# Patient Record
Sex: Female | Born: 1950 | Race: White | Hispanic: No | Marital: Married | Smoking: Former smoker
Health system: Southern US, Community
[De-identification: ages and names within clinical notes are randomized; demographics above are authoritative.]

## PROBLEM LIST (undated history)

## (undated) DIAGNOSIS — E119 Type 2 diabetes mellitus without complications: Secondary | ICD-10-CM

## (undated) DIAGNOSIS — G039 Meningitis, unspecified: Secondary | ICD-10-CM

## (undated) DIAGNOSIS — R6889 Other general symptoms and signs: Secondary | ICD-10-CM

## (undated) DIAGNOSIS — M539 Dorsopathy, unspecified: Secondary | ICD-10-CM

## (undated) DIAGNOSIS — I10 Essential (primary) hypertension: Secondary | ICD-10-CM

## (undated) DIAGNOSIS — J449 Chronic obstructive pulmonary disease, unspecified: Secondary | ICD-10-CM

## (undated) DIAGNOSIS — E7800 Pure hypercholesterolemia, unspecified: Secondary | ICD-10-CM

## (undated) DIAGNOSIS — M1990 Unspecified osteoarthritis, unspecified site: Secondary | ICD-10-CM

## (undated) HISTORY — DX: Dorsopathy, unspecified: M53.9

## (undated) HISTORY — DX: Other general symptoms and signs: R68.89

## (undated) HISTORY — DX: Essential (primary) hypertension: I10

## (undated) HISTORY — DX: Meningitis, unspecified: G03.9

## (undated) HISTORY — DX: Type 2 diabetes mellitus without complications: E11.9

## (undated) HISTORY — DX: Chronic obstructive pulmonary disease, unspecified: J44.9

## (undated) HISTORY — DX: Unspecified osteoarthritis, unspecified site: M19.90

## (undated) HISTORY — DX: Pure hypercholesterolemia, unspecified: E78.00

---
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
## 1976-11-25 HISTORY — PX: BACK SURGERY: SHX140

## 1983-11-26 HISTORY — PX: OVARIAN CYST REMOVAL: SHX89

## 1990-11-25 HISTORY — PX: CHOLECYSTECTOMY: SHX55

## 1991-11-26 HISTORY — PX: KNEE SURGERY: SHX244

## 2001-11-25 HISTORY — PX: WRIST SURGERY: SHX841

## 2005-02-04 ENCOUNTER — Inpatient Hospital Stay: Payer: Self-pay | Admitting: Internal Medicine

## 2005-04-03 ENCOUNTER — Ambulatory Visit: Payer: Self-pay

## 2005-04-04 ENCOUNTER — Ambulatory Visit: Payer: Self-pay

## 2005-04-30 ENCOUNTER — Emergency Department: Payer: Self-pay | Admitting: Emergency Medicine

## 2005-05-01 ENCOUNTER — Inpatient Hospital Stay: Payer: Self-pay | Admitting: Internal Medicine

## 2005-06-28 ENCOUNTER — Ambulatory Visit: Payer: Self-pay | Admitting: Internal Medicine

## 2005-08-25 ENCOUNTER — Ambulatory Visit: Payer: Self-pay | Admitting: Internal Medicine

## 2007-01-04 ENCOUNTER — Emergency Department (HOSPITAL_COMMUNITY): Admission: EM | Admit: 2007-01-04 | Discharge: 2007-01-04 | Payer: Self-pay | Admitting: Emergency Medicine

## 2007-10-19 ENCOUNTER — Emergency Department: Payer: Self-pay | Admitting: Emergency Medicine

## 2016-01-01 DIAGNOSIS — M545 Low back pain: Secondary | ICD-10-CM | POA: Diagnosis not present

## 2016-01-01 DIAGNOSIS — G894 Chronic pain syndrome: Secondary | ICD-10-CM | POA: Diagnosis not present

## 2016-01-01 DIAGNOSIS — Z79891 Long term (current) use of opiate analgesic: Secondary | ICD-10-CM | POA: Diagnosis not present

## 2016-01-01 DIAGNOSIS — M533 Sacrococcygeal disorders, not elsewhere classified: Secondary | ICD-10-CM | POA: Diagnosis not present

## 2016-01-01 DIAGNOSIS — Z5181 Encounter for therapeutic drug level monitoring: Secondary | ICD-10-CM | POA: Diagnosis not present

## 2016-01-03 DIAGNOSIS — I10 Essential (primary) hypertension: Secondary | ICD-10-CM | POA: Diagnosis not present

## 2016-01-03 DIAGNOSIS — J449 Chronic obstructive pulmonary disease, unspecified: Secondary | ICD-10-CM | POA: Diagnosis not present

## 2016-01-03 DIAGNOSIS — K3184 Gastroparesis: Secondary | ICD-10-CM | POA: Diagnosis not present

## 2016-01-03 DIAGNOSIS — R251 Tremor, unspecified: Secondary | ICD-10-CM | POA: Diagnosis not present

## 2016-01-03 DIAGNOSIS — E785 Hyperlipidemia, unspecified: Secondary | ICD-10-CM | POA: Diagnosis not present

## 2016-01-03 DIAGNOSIS — E119 Type 2 diabetes mellitus without complications: Secondary | ICD-10-CM | POA: Diagnosis not present

## 2016-01-16 ENCOUNTER — Ambulatory Visit (INDEPENDENT_AMBULATORY_CARE_PROVIDER_SITE_OTHER): Payer: PPO | Admitting: Neurology

## 2016-01-16 ENCOUNTER — Encounter: Payer: Self-pay | Admitting: Neurology

## 2016-01-16 VITALS — BP 126/67 | HR 78 | Ht 63.5 in | Wt 130.8 lb

## 2016-01-16 DIAGNOSIS — R251 Tremor, unspecified: Secondary | ICD-10-CM | POA: Diagnosis not present

## 2016-01-16 DIAGNOSIS — E785 Hyperlipidemia, unspecified: Secondary | ICD-10-CM

## 2016-01-16 DIAGNOSIS — I10 Essential (primary) hypertension: Secondary | ICD-10-CM | POA: Diagnosis not present

## 2016-01-16 NOTE — Progress Notes (Signed)
[facility]    Provider:  Dr Lucia Gaskins Referring Provider: Jackie Plum, MD Primary Care Physician:  No primary care provider on file.  CC:  Shakes  HPI:  Nicole Pittman is a 65 y.o. female here as a referral from Dr. Julio Sicks for shaking. PMHx HTN, tremor, HLD, diabetes, COPD. Worse when she writes, when she has a glass in her hand. Also in the head. Husband noticed it 3-4 months ago. Mother has shaking with parkinsons disease. Patient does not shake at rest. No new medications. No medication changes. Resting her head makes it better. No new neck pain or cervical muscle pain or range of motion in the neck changes. No inciting events or trauma. No new stress at home. She has a 66 year old with Autism but nothing has changed. Doesn't drink caffeine. The tremor only bothers her when she is writing. She takes Keppra and Reglan daily and she has been on those for at least 10 years. Nothing makes the tremor worse. No inciting event or head injury.   Reviewed notes, labs and imaging from outside physicians, which showed: reviewed records from PCP who noted b/l tremors in the hands worse with action. No imaging in EPIC, no labs in EPIC or in the documentation from pcp   Review of Systems: Patient complains of symptoms per HPI as well as the following symptoms: cough, joint pain, birth mark, tremor, insomnia. Pertinent negatives per HPI. All others negative.   Social History   Social History  . Marital Status: Married    Spouse Name: Dannielle Huh  . Number of Children: 4  . Years of Education: 12   Occupational History  . Retired    Social History Main Topics  . Smoking status: Current Every Day Smoker -- 0.50 packs/day    Types: Cigarettes  . Smokeless tobacco: Not on file  . Alcohol Use: No  . Drug Use: No  . Sexual Activity: Not on file   Other Topics Concern  . Not on file   Social History Narrative   Lives w/ husband and sons   Caffeine use: none     Family History  Problem Relation Age of Onset  . Rheum arthritis Mother   . Hypertension Mother   . Parkinson's disease Mother   . Parkinsonism Mother   . Breast cancer Maternal Aunt   . Lupus Maternal Aunt   . Diabetes Father     Past Medical History  Diagnosis Date  . Hypertension   . Diabetes (HCC)   . High cholesterol   . Arthritis   . Back problem     spurs  . COPD (chronic obstructive pulmonary disease) (HCC)   . Adhesive arachnoiditis   . Neck problem     bad disc    Past Surgical History  Procedure Laterality Date  . Tonsillectomy  1959  . Wrist surgery Bilateral 2003  . Knee surgery Bilateral 1993  . Cholecystectomy  1992  . Ovarian cyst removal  1985  . Back surgery  1978    L5    Current Outpatient Prescriptions  Medication Sig Dispense Refill  . Alcohol Swabs (ALCOHOL PREP) 70 % PADS 4 (four) times daily.  99  . amitriptyline (ELAVIL) 25 MG tablet Take 25 mg by mouth at bedtime.    Marland Kitchen aspirin 81 MG tablet Take 81 mg by mouth daily.    Marland Kitchen atorvastatin (LIPITOR) 40 MG tablet Take 40 mg by mouth daily.    . baclofen (LIORESAL) 10 MG  tablet Take 10 mg by mouth 3 (three) times daily.    Marland Kitchen EASY COMFORT LANCETS MISC 4 (four) times daily. for testing  99  . fenofibrate (TRICOR) 48 MG tablet Take 48 mg by mouth daily.    Marland Kitchen FREESTYLE LITE test strip 4 (four) times daily. for testing  99  . glipiZIDE-metformin (METAGLIP) 5-500 MG tablet Take 2 tablets by mouth 2 (two) times daily.  0  . levETIRAcetam (KEPPRA) 500 MG tablet Take 500 mg by mouth 3 (three) times daily.    . metoCLOPramide (REGLAN) 10 MG tablet Take 10 mg by mouth 4 (four) times daily.    . metoprolol (LOPRESSOR) 50 MG tablet Take 25 mg by mouth daily.     Marland Kitchen oxyCODONE (OXY IR/ROXICODONE) 5 MG immediate release tablet Take 5 mg by mouth every 6 (six) hours as needed.    . OXYCONTIN 20 MG 12 hr tablet Take 20 mg by mouth at bedtime.    . pioglitazone (ACTOS) 30 MG tablet Take 30 mg by mouth daily.     Marland Kitchen PROAIR HFA 108 (90 Base) MCG/ACT inhaler Take 2 puffs by mouth 4 (four) times daily. For 30 days  5  . quinapril (ACCUPRIL) 20 MG tablet Take 20 mg by mouth daily.  1  . SYMBICORT 80-4.5 MCG/ACT inhaler Take 2 puffs by mouth 2 (two) times daily.  2  . triamterene-hydrochlorothiazide (MAXZIDE-25) 37.5-25 MG tablet Take 1 tablet by mouth daily.  1  . Vitamin D, Ergocalciferol, (DRISDOL) 50000 units CAPS capsule Take 50,000 Units by mouth 2 (two) times a week.    . zolpidem (AMBIEN) 10 MG tablet Take 10 mg by mouth daily.     No current facility-administered medications for this visit.    Allergies as of 01/16/2016 - Review Complete 01/16/2016  Allergen Reaction Noted  . Codeine Hives 01/16/2016  . Morphine and related Hives 01/16/2016    Vitals: BP 126/67 mmHg  Pulse 78  Ht 5' 3.5" (1.613 m)  Wt 130 lb 12.8 oz (59.33 kg)  BMI 22.80 kg/m2 Last Weight:  Wt Readings from Last 1 Encounters:  01/16/16 130 lb 12.8 oz (59.33 kg)   Last Height:   Ht Readings from Last 1 Encounters:  01/16/16 5' 3.5" (1.613 m)   Physical exam: Exam: Gen: NAD, conversant, well nourised, obese, well groomed                     CV: RRR, no MRG. No Carotid Bruits. No peripheral edema, warm, nontender Eyes: Conjunctivae clear without exudates or hemorrhage  Neuro: Detailed Neurologic Exam  Speech:    Speech is normal; fluent and spontaneous with normal comprehension.  Cognition:    The patient is oriented to person, place, and time;     recent and remote memory intact;     language fluent;     normal attention, concentration,     fund of knowledge Cranial Nerves:    The pupils are equal, round, and reactive to light. Could not visualize fundi, attempted. Visual fields are full to finger confrontation. Extraocular movements are intact. Trigeminal sensation is intact and the muscles of mastication are normal. The face is symmetric. The palate elevates in the midline. Hearing intact. Voice is normal.  Shoulder shrug is normal. The tongue has normal motion without fasciculations.   Coordination:    Normal finger to nose and heel to shin. Normal rapid alternating movements.   Gait:    Heel-toe and tandem gait are normal.  Motor Observation:    Mild high frequency low amplitude head tremor Tone:    Normal muscle tone.    Posture:    Posture is normal. normal erect    Strength: Milt bilateral hip flexion weakness.     Strength is V/V in the upper and lower limbs.      Sensation: intact to LT     Reflex Exam:  DTR's:    Deep tendon reflexes in the upper and lower extremities are normal bilaterally.   Toes:    The toes are downgoing bilaterally.   Clonus:    Clonus is absent.       Assessment/Plan:  65 year old female with high frequency hand and mouth tremor worse with action. May be the start of essential tremor. Reviewed with patient, gave her hand out from [employer] to read about tremors. Explained this is benign and does not turn into parkinson's or any other serious condition but can affect actions significantly. Will order labs such as TSH. If negative can try Primidone. F/u 3 months.  Naomie Dean, MD  [facility] [address] [place], [place] [address]  Phone [phone] Fax [phone]

## 2016-01-16 NOTE — Patient Instructions (Signed)
Remember to drink plenty of fluid, eat healthy meals and do not skip any meals. Try to eat protein with a every meal and eat a healthy snack such as fruit or nuts in between meals. Try to keep a regular sleep-wake schedule and try to exercise daily, particularly in the form of walking, 20-30 minutes a day, if you can.   As far as your medications are concerned, I would like to suggest: If labs normal, consider starting Primidone  As far as diagnostic testing: labs  I would like to see you back in 3 months, sooner if we need to. Please call us with any interim questions, concerns, problems, updates or refill requests.   Our phone number is [phone]. We also have an after hours call service for urgent matters and there is a physician on-call for urgent questions. For any emergencies you know to call 911 or go to the nearest emergency room

## 2016-01-18 LAB — CBC WITH DIFFERENTIAL/PLATELET
BASOS ABS: 0 10*3/uL (ref 0.0–0.2)
BASOS: 0 %
EOS (ABSOLUTE): 0.4 10*3/uL (ref 0.0–0.4)
Eos: 3 %
HEMOGLOBIN: 12.5 g/dL (ref 11.1–15.9)
Hematocrit: 38.2 % (ref 34.0–46.6)
Immature Grans (Abs): 0.1 10*3/uL (ref 0.0–0.1)
Immature Granulocytes: 1 %
LYMPHS ABS: 2.7 10*3/uL (ref 0.7–3.1)
Lymphs: 22 %
MCH: 29.6 pg (ref 26.6–33.0)
MCHC: 32.7 g/dL (ref 31.5–35.7)
MCV: 90 fL (ref 79–97)
Monocytes Absolute: 1 10*3/uL — ABNORMAL HIGH (ref 0.1–0.9)
Monocytes: 8 %
NEUTROS ABS: 8.4 10*3/uL — AB (ref 1.4–7.0)
Neutrophils: 66 %
PLATELETS: 331 10*3/uL (ref 150–379)
RBC: 4.23 x10E6/uL (ref 3.77–5.28)
RDW: 13.6 % (ref 12.3–15.4)
WBC: 12.5 10*3/uL — ABNORMAL HIGH (ref 3.4–10.8)

## 2016-01-18 LAB — THYROID PANEL WITH TSH
Free Thyroxine Index: 2.2 (ref 1.2–4.9)
T3 UPTAKE RATIO: 28 % (ref 24–39)
T4 TOTAL: 8 ug/dL (ref 4.5–12.0)
TSH: 2 u[IU]/mL (ref 0.450–4.500)

## 2016-01-18 LAB — COMPREHENSIVE METABOLIC PANEL
ALT: 19 IU/L (ref 0–32)
AST: 18 IU/L (ref 0–40)
Albumin/Globulin Ratio: 2 (ref 1.1–2.5)
Albumin: 4.9 g/dL — ABNORMAL HIGH (ref 3.6–4.8)
Alkaline Phosphatase: 122 IU/L — ABNORMAL HIGH (ref 39–117)
BUN/Creatinine Ratio: 25 (ref 11–26)
BUN: 21 mg/dL (ref 8–27)
Bilirubin Total: 0.2 mg/dL (ref 0.0–1.2)
CO2: 19 mmol/L (ref 18–29)
Calcium: 9.8 mg/dL (ref 8.7–10.3)
Chloride: 96 mmol/L (ref 96–106)
Creatinine, Ser: 0.84 mg/dL (ref 0.57–1.00)
GFR calc Af Amer: 84 mL/min/{1.73_m2} (ref >59)
GFR calc non Af Amer: 73 mL/min/{1.73_m2} (ref >59)
Globulin, Total: 2.5 g/dL (ref 1.5–4.5)
Glucose: 160 mg/dL — ABNORMAL HIGH (ref 65–99)
Potassium: 4.3 mmol/L (ref 3.5–5.2)
Sodium: 136 mmol/L (ref 134–144)
Total Protein: 7.4 g/dL (ref 6.0–8.5)

## 2016-01-18 LAB — HEAVY METALS, BLOOD
ARSENIC: 6 ug/L (ref 2–23)
Lead, Blood: NOT DETECTED ug/dL (ref 0–19)
MERCURY: NOT DETECTED ug/L (ref 0.0–14.9)

## 2016-01-19 ENCOUNTER — Telehealth: Payer: Self-pay | Admitting: *Deleted

## 2016-01-19 NOTE — Telephone Encounter (Signed)
-----   Message from Anson Fret, MD sent at 01/18/2016  7:28 PM EST ----- White blood cells a little elevated. Nothing concerning if she feels well. She should just probably have this repeated next time she is at her primary care within the next 3-6 months.

## 2016-01-19 NOTE — Telephone Encounter (Signed)
Called pt and relayed results per Dr Lucia Gaskins note. Pt verbalized understanding.

## 2016-01-21 ENCOUNTER — Telehealth: Payer: Self-pay | Admitting: Neurology

## 2016-01-21 ENCOUNTER — Encounter: Payer: Self-pay | Admitting: Neurology

## 2016-01-21 DIAGNOSIS — I10 Essential (primary) hypertension: Secondary | ICD-10-CM | POA: Insufficient documentation

## 2016-01-21 DIAGNOSIS — E785 Hyperlipidemia, unspecified: Secondary | ICD-10-CM | POA: Insufficient documentation

## 2016-01-21 DIAGNOSIS — R251 Tremor, unspecified: Secondary | ICD-10-CM | POA: Insufficient documentation

## 2016-01-21 DIAGNOSIS — J449 Chronic obstructive pulmonary disease, unspecified: Secondary | ICD-10-CM | POA: Insufficient documentation

## 2016-01-21 DIAGNOSIS — E119 Type 2 diabetes mellitus without complications: Secondary | ICD-10-CM | POA: Insufficient documentation

## 2016-01-21 NOTE — Telephone Encounter (Signed)
Ask if patient would like to try Primidone

## 2016-01-22 DIAGNOSIS — I10 Essential (primary) hypertension: Secondary | ICD-10-CM | POA: Diagnosis not present

## 2016-01-22 DIAGNOSIS — E559 Vitamin D deficiency, unspecified: Secondary | ICD-10-CM | POA: Diagnosis not present

## 2016-01-22 NOTE — Telephone Encounter (Signed)
Patient has cut back on the albuterol and the tremors are better. thanks

## 2016-02-15 DIAGNOSIS — E119 Type 2 diabetes mellitus without complications: Secondary | ICD-10-CM | POA: Diagnosis not present

## 2016-02-15 DIAGNOSIS — K3184 Gastroparesis: Secondary | ICD-10-CM | POA: Diagnosis not present

## 2016-02-15 DIAGNOSIS — J449 Chronic obstructive pulmonary disease, unspecified: Secondary | ICD-10-CM | POA: Diagnosis not present

## 2016-02-15 DIAGNOSIS — E785 Hyperlipidemia, unspecified: Secondary | ICD-10-CM | POA: Diagnosis not present

## 2016-02-15 DIAGNOSIS — I10 Essential (primary) hypertension: Secondary | ICD-10-CM | POA: Diagnosis not present

## 2016-02-15 DIAGNOSIS — R251 Tremor, unspecified: Secondary | ICD-10-CM | POA: Diagnosis not present

## 2016-02-22 DIAGNOSIS — E559 Vitamin D deficiency, unspecified: Secondary | ICD-10-CM | POA: Diagnosis not present

## 2016-02-22 DIAGNOSIS — I10 Essential (primary) hypertension: Secondary | ICD-10-CM | POA: Diagnosis not present

## 2016-03-14 DIAGNOSIS — E559 Vitamin D deficiency, unspecified: Secondary | ICD-10-CM | POA: Diagnosis not present

## 2016-03-14 DIAGNOSIS — I10 Essential (primary) hypertension: Secondary | ICD-10-CM | POA: Diagnosis not present

## 2016-04-04 DIAGNOSIS — E559 Vitamin D deficiency, unspecified: Secondary | ICD-10-CM | POA: Diagnosis not present

## 2016-04-04 DIAGNOSIS — I10 Essential (primary) hypertension: Secondary | ICD-10-CM | POA: Diagnosis not present

## 2016-04-15 ENCOUNTER — Ambulatory Visit: Payer: PPO | Admitting: Neurology

## 2016-04-18 DIAGNOSIS — M533 Sacrococcygeal disorders, not elsewhere classified: Secondary | ICD-10-CM | POA: Diagnosis not present

## 2016-04-18 DIAGNOSIS — Z79891 Long term (current) use of opiate analgesic: Secondary | ICD-10-CM | POA: Diagnosis not present

## 2016-04-18 DIAGNOSIS — M545 Low back pain: Secondary | ICD-10-CM | POA: Diagnosis not present

## 2016-04-18 DIAGNOSIS — G894 Chronic pain syndrome: Secondary | ICD-10-CM | POA: Diagnosis not present

## 2016-05-09 DIAGNOSIS — E559 Vitamin D deficiency, unspecified: Secondary | ICD-10-CM | POA: Diagnosis not present

## 2016-05-09 DIAGNOSIS — I10 Essential (primary) hypertension: Secondary | ICD-10-CM | POA: Diagnosis not present

## 2016-05-16 DIAGNOSIS — E119 Type 2 diabetes mellitus without complications: Secondary | ICD-10-CM | POA: Diagnosis not present

## 2016-05-16 DIAGNOSIS — I10 Essential (primary) hypertension: Secondary | ICD-10-CM | POA: Diagnosis not present

## 2016-05-16 DIAGNOSIS — J449 Chronic obstructive pulmonary disease, unspecified: Secondary | ICD-10-CM | POA: Diagnosis not present

## 2016-05-16 DIAGNOSIS — Z0000 Encounter for general adult medical examination without abnormal findings: Secondary | ICD-10-CM | POA: Diagnosis not present

## 2016-05-16 DIAGNOSIS — E785 Hyperlipidemia, unspecified: Secondary | ICD-10-CM | POA: Diagnosis not present

## 2016-05-16 DIAGNOSIS — K3184 Gastroparesis: Secondary | ICD-10-CM | POA: Diagnosis not present

## 2016-05-29 ENCOUNTER — Ambulatory Visit (INDEPENDENT_AMBULATORY_CARE_PROVIDER_SITE_OTHER): Payer: PPO | Admitting: Neurology

## 2016-05-29 ENCOUNTER — Telehealth: Payer: Self-pay | Admitting: *Deleted

## 2016-05-29 DIAGNOSIS — R251 Tremor, unspecified: Secondary | ICD-10-CM

## 2016-05-29 NOTE — Progress Notes (Signed)
No show

## 2016-05-29 NOTE — Telephone Encounter (Signed)
?  no showed

## 2016-05-30 ENCOUNTER — Encounter: Payer: Self-pay | Admitting: Neurology

## 2016-06-04 DIAGNOSIS — M533 Sacrococcygeal disorders, not elsewhere classified: Secondary | ICD-10-CM | POA: Diagnosis not present

## 2016-06-04 DIAGNOSIS — M545 Low back pain: Secondary | ICD-10-CM | POA: Diagnosis not present

## 2016-06-04 DIAGNOSIS — G894 Chronic pain syndrome: Secondary | ICD-10-CM | POA: Diagnosis not present

## 2016-06-04 DIAGNOSIS — M961 Postlaminectomy syndrome, not elsewhere classified: Secondary | ICD-10-CM | POA: Diagnosis not present

## 2016-06-04 DIAGNOSIS — Z79891 Long term (current) use of opiate analgesic: Secondary | ICD-10-CM | POA: Diagnosis not present

## 2016-06-07 DIAGNOSIS — I10 Essential (primary) hypertension: Secondary | ICD-10-CM | POA: Diagnosis not present

## 2016-06-07 DIAGNOSIS — E559 Vitamin D deficiency, unspecified: Secondary | ICD-10-CM | POA: Diagnosis not present

## 2016-07-08 DIAGNOSIS — I10 Essential (primary) hypertension: Secondary | ICD-10-CM | POA: Diagnosis not present

## 2016-07-08 DIAGNOSIS — E559 Vitamin D deficiency, unspecified: Secondary | ICD-10-CM | POA: Diagnosis not present

## 2016-07-30 DIAGNOSIS — E785 Hyperlipidemia, unspecified: Secondary | ICD-10-CM | POA: Diagnosis not present

## 2016-07-30 DIAGNOSIS — I10 Essential (primary) hypertension: Secondary | ICD-10-CM | POA: Diagnosis not present

## 2016-07-30 DIAGNOSIS — J449 Chronic obstructive pulmonary disease, unspecified: Secondary | ICD-10-CM | POA: Diagnosis not present

## 2016-07-30 DIAGNOSIS — E119 Type 2 diabetes mellitus without complications: Secondary | ICD-10-CM | POA: Diagnosis not present

## 2016-07-30 DIAGNOSIS — K3184 Gastroparesis: Secondary | ICD-10-CM | POA: Diagnosis not present

## 2016-07-30 DIAGNOSIS — G250 Essential tremor: Secondary | ICD-10-CM | POA: Diagnosis not present

## 2016-10-15 DIAGNOSIS — Z87891 Personal history of nicotine dependence: Secondary | ICD-10-CM | POA: Diagnosis not present

## 2016-10-15 DIAGNOSIS — J449 Chronic obstructive pulmonary disease, unspecified: Secondary | ICD-10-CM | POA: Diagnosis not present

## 2016-10-15 DIAGNOSIS — K3184 Gastroparesis: Secondary | ICD-10-CM | POA: Diagnosis not present

## 2016-10-15 DIAGNOSIS — Z0000 Encounter for general adult medical examination without abnormal findings: Secondary | ICD-10-CM | POA: Diagnosis not present

## 2016-10-15 DIAGNOSIS — I10 Essential (primary) hypertension: Secondary | ICD-10-CM | POA: Diagnosis not present

## 2016-10-15 DIAGNOSIS — E785 Hyperlipidemia, unspecified: Secondary | ICD-10-CM | POA: Diagnosis not present

## 2016-10-15 DIAGNOSIS — G250 Essential tremor: Secondary | ICD-10-CM | POA: Diagnosis not present

## 2016-10-15 DIAGNOSIS — E119 Type 2 diabetes mellitus without complications: Secondary | ICD-10-CM | POA: Diagnosis not present

## 2016-11-29 DIAGNOSIS — M545 Low back pain: Secondary | ICD-10-CM | POA: Diagnosis not present

## 2016-11-29 DIAGNOSIS — G894 Chronic pain syndrome: Secondary | ICD-10-CM | POA: Diagnosis not present

## 2016-11-29 DIAGNOSIS — Z79891 Long term (current) use of opiate analgesic: Secondary | ICD-10-CM | POA: Diagnosis not present

## 2016-11-29 DIAGNOSIS — M961 Postlaminectomy syndrome, not elsewhere classified: Secondary | ICD-10-CM | POA: Diagnosis not present

## 2016-11-29 DIAGNOSIS — M533 Sacrococcygeal disorders, not elsewhere classified: Secondary | ICD-10-CM | POA: Diagnosis not present

## 2017-01-28 DIAGNOSIS — Z5181 Encounter for therapeutic drug level monitoring: Secondary | ICD-10-CM | POA: Diagnosis not present

## 2017-01-28 DIAGNOSIS — E785 Hyperlipidemia, unspecified: Secondary | ICD-10-CM | POA: Diagnosis not present

## 2017-01-28 DIAGNOSIS — Z01118 Encounter for examination of ears and hearing with other abnormal findings: Secondary | ICD-10-CM | POA: Diagnosis not present

## 2017-01-28 DIAGNOSIS — G250 Essential tremor: Secondary | ICD-10-CM | POA: Diagnosis not present

## 2017-01-28 DIAGNOSIS — Z136 Encounter for screening for cardiovascular disorders: Secondary | ICD-10-CM | POA: Diagnosis not present

## 2017-01-28 DIAGNOSIS — E119 Type 2 diabetes mellitus without complications: Secondary | ICD-10-CM | POA: Diagnosis not present

## 2017-01-28 DIAGNOSIS — H538 Other visual disturbances: Secondary | ICD-10-CM | POA: Diagnosis not present

## 2017-01-28 DIAGNOSIS — Z87891 Personal history of nicotine dependence: Secondary | ICD-10-CM | POA: Diagnosis not present

## 2017-01-28 DIAGNOSIS — J449 Chronic obstructive pulmonary disease, unspecified: Secondary | ICD-10-CM | POA: Diagnosis not present

## 2017-01-28 DIAGNOSIS — Z0000 Encounter for general adult medical examination without abnormal findings: Secondary | ICD-10-CM | POA: Diagnosis not present

## 2017-01-28 DIAGNOSIS — I10 Essential (primary) hypertension: Secondary | ICD-10-CM | POA: Diagnosis not present

## 2017-01-28 DIAGNOSIS — K3184 Gastroparesis: Secondary | ICD-10-CM | POA: Diagnosis not present

## 2017-04-09 DIAGNOSIS — M961 Postlaminectomy syndrome, not elsewhere classified: Secondary | ICD-10-CM | POA: Diagnosis not present

## 2017-04-09 DIAGNOSIS — Z79891 Long term (current) use of opiate analgesic: Secondary | ICD-10-CM | POA: Diagnosis not present

## 2017-04-09 DIAGNOSIS — M533 Sacrococcygeal disorders, not elsewhere classified: Secondary | ICD-10-CM | POA: Diagnosis not present

## 2017-04-09 DIAGNOSIS — M545 Low back pain: Secondary | ICD-10-CM | POA: Diagnosis not present

## 2017-04-09 DIAGNOSIS — G894 Chronic pain syndrome: Secondary | ICD-10-CM | POA: Diagnosis not present

## 2017-07-03 DIAGNOSIS — E785 Hyperlipidemia, unspecified: Secondary | ICD-10-CM | POA: Diagnosis not present

## 2017-07-03 DIAGNOSIS — E119 Type 2 diabetes mellitus without complications: Secondary | ICD-10-CM | POA: Diagnosis not present

## 2017-07-03 DIAGNOSIS — Z87891 Personal history of nicotine dependence: Secondary | ICD-10-CM | POA: Diagnosis not present

## 2017-07-03 DIAGNOSIS — K3184 Gastroparesis: Secondary | ICD-10-CM | POA: Diagnosis not present

## 2017-07-03 DIAGNOSIS — G250 Essential tremor: Secondary | ICD-10-CM | POA: Diagnosis not present

## 2017-07-03 DIAGNOSIS — J449 Chronic obstructive pulmonary disease, unspecified: Secondary | ICD-10-CM | POA: Diagnosis not present

## 2017-07-03 DIAGNOSIS — I10 Essential (primary) hypertension: Secondary | ICD-10-CM | POA: Diagnosis not present

## 2017-07-09 DIAGNOSIS — M533 Sacrococcygeal disorders, not elsewhere classified: Secondary | ICD-10-CM | POA: Diagnosis not present

## 2017-07-09 DIAGNOSIS — M961 Postlaminectomy syndrome, not elsewhere classified: Secondary | ICD-10-CM | POA: Diagnosis not present

## 2017-07-09 DIAGNOSIS — Z79891 Long term (current) use of opiate analgesic: Secondary | ICD-10-CM | POA: Diagnosis not present

## 2017-07-09 DIAGNOSIS — Z5181 Encounter for therapeutic drug level monitoring: Secondary | ICD-10-CM | POA: Diagnosis not present

## 2017-07-09 DIAGNOSIS — M545 Low back pain: Secondary | ICD-10-CM | POA: Diagnosis not present

## 2017-07-09 DIAGNOSIS — G894 Chronic pain syndrome: Secondary | ICD-10-CM | POA: Diagnosis not present

## 2017-09-26 DIAGNOSIS — M961 Postlaminectomy syndrome, not elsewhere classified: Secondary | ICD-10-CM | POA: Diagnosis not present

## 2017-09-26 DIAGNOSIS — G894 Chronic pain syndrome: Secondary | ICD-10-CM | POA: Diagnosis not present

## 2017-09-26 DIAGNOSIS — M545 Low back pain: Secondary | ICD-10-CM | POA: Diagnosis not present

## 2017-09-26 DIAGNOSIS — Z79891 Long term (current) use of opiate analgesic: Secondary | ICD-10-CM | POA: Diagnosis not present

## 2017-10-15 DIAGNOSIS — Z87891 Personal history of nicotine dependence: Secondary | ICD-10-CM | POA: Diagnosis not present

## 2017-10-15 DIAGNOSIS — G250 Essential tremor: Secondary | ICD-10-CM | POA: Diagnosis not present

## 2017-10-15 DIAGNOSIS — J449 Chronic obstructive pulmonary disease, unspecified: Secondary | ICD-10-CM | POA: Diagnosis not present

## 2017-10-15 DIAGNOSIS — Z0000 Encounter for general adult medical examination without abnormal findings: Secondary | ICD-10-CM | POA: Diagnosis not present

## 2017-10-15 DIAGNOSIS — I10 Essential (primary) hypertension: Secondary | ICD-10-CM | POA: Diagnosis not present

## 2017-10-15 DIAGNOSIS — E119 Type 2 diabetes mellitus without complications: Secondary | ICD-10-CM | POA: Diagnosis not present

## 2017-10-15 DIAGNOSIS — K3184 Gastroparesis: Secondary | ICD-10-CM | POA: Diagnosis not present

## 2017-10-15 DIAGNOSIS — E785 Hyperlipidemia, unspecified: Secondary | ICD-10-CM | POA: Diagnosis not present

## 2017-12-17 DIAGNOSIS — I10 Essential (primary) hypertension: Secondary | ICD-10-CM | POA: Diagnosis not present

## 2017-12-17 DIAGNOSIS — E119 Type 2 diabetes mellitus without complications: Secondary | ICD-10-CM | POA: Diagnosis not present

## 2017-12-18 DIAGNOSIS — Z79891 Long term (current) use of opiate analgesic: Secondary | ICD-10-CM | POA: Diagnosis not present

## 2017-12-18 DIAGNOSIS — G894 Chronic pain syndrome: Secondary | ICD-10-CM | POA: Diagnosis not present

## 2017-12-18 DIAGNOSIS — Z5181 Encounter for therapeutic drug level monitoring: Secondary | ICD-10-CM | POA: Diagnosis not present

## 2017-12-18 DIAGNOSIS — M961 Postlaminectomy syndrome, not elsewhere classified: Secondary | ICD-10-CM | POA: Diagnosis not present

## 2017-12-18 DIAGNOSIS — M545 Low back pain: Secondary | ICD-10-CM | POA: Diagnosis not present

## 2018-01-09 DIAGNOSIS — E119 Type 2 diabetes mellitus without complications: Secondary | ICD-10-CM | POA: Diagnosis not present

## 2018-01-14 DIAGNOSIS — J449 Chronic obstructive pulmonary disease, unspecified: Secondary | ICD-10-CM | POA: Diagnosis not present

## 2018-01-14 DIAGNOSIS — E119 Type 2 diabetes mellitus without complications: Secondary | ICD-10-CM | POA: Diagnosis not present

## 2018-01-14 DIAGNOSIS — E785 Hyperlipidemia, unspecified: Secondary | ICD-10-CM | POA: Diagnosis not present

## 2018-01-14 DIAGNOSIS — G250 Essential tremor: Secondary | ICD-10-CM | POA: Diagnosis not present

## 2018-01-14 DIAGNOSIS — K3184 Gastroparesis: Secondary | ICD-10-CM | POA: Diagnosis not present

## 2018-01-14 DIAGNOSIS — Z87891 Personal history of nicotine dependence: Secondary | ICD-10-CM | POA: Diagnosis not present

## 2018-01-14 DIAGNOSIS — I10 Essential (primary) hypertension: Secondary | ICD-10-CM | POA: Diagnosis not present

## 2018-01-21 DIAGNOSIS — E119 Type 2 diabetes mellitus without complications: Secondary | ICD-10-CM | POA: Diagnosis not present

## 2018-01-21 DIAGNOSIS — I10 Essential (primary) hypertension: Secondary | ICD-10-CM | POA: Diagnosis not present

## 2018-02-06 DIAGNOSIS — M961 Postlaminectomy syndrome, not elsewhere classified: Secondary | ICD-10-CM | POA: Diagnosis not present

## 2018-02-06 DIAGNOSIS — M545 Low back pain: Secondary | ICD-10-CM | POA: Diagnosis not present

## 2018-02-06 DIAGNOSIS — G894 Chronic pain syndrome: Secondary | ICD-10-CM | POA: Diagnosis not present

## 2018-02-06 DIAGNOSIS — M1288 Other specific arthropathies, not elsewhere classified, other specified site: Secondary | ICD-10-CM | POA: Diagnosis not present

## 2018-02-06 DIAGNOSIS — M47817 Spondylosis without myelopathy or radiculopathy, lumbosacral region: Secondary | ICD-10-CM | POA: Diagnosis not present

## 2018-02-06 DIAGNOSIS — Z79891 Long term (current) use of opiate analgesic: Secondary | ICD-10-CM | POA: Diagnosis not present

## 2018-02-06 DIAGNOSIS — Z5181 Encounter for therapeutic drug level monitoring: Secondary | ICD-10-CM | POA: Diagnosis not present

## 2018-03-04 DIAGNOSIS — I10 Essential (primary) hypertension: Secondary | ICD-10-CM | POA: Diagnosis not present

## 2018-03-04 DIAGNOSIS — J449 Chronic obstructive pulmonary disease, unspecified: Secondary | ICD-10-CM | POA: Diagnosis not present

## 2018-03-18 DIAGNOSIS — I10 Essential (primary) hypertension: Secondary | ICD-10-CM | POA: Diagnosis not present

## 2018-03-18 DIAGNOSIS — Z0110 Encounter for examination of ears and hearing without abnormal findings: Secondary | ICD-10-CM | POA: Diagnosis not present

## 2018-03-18 DIAGNOSIS — E785 Hyperlipidemia, unspecified: Secondary | ICD-10-CM | POA: Diagnosis not present

## 2018-03-18 DIAGNOSIS — Z0101 Encounter for examination of eyes and vision with abnormal findings: Secondary | ICD-10-CM | POA: Diagnosis not present

## 2018-03-18 DIAGNOSIS — E119 Type 2 diabetes mellitus without complications: Secondary | ICD-10-CM | POA: Diagnosis not present

## 2018-03-18 DIAGNOSIS — Z87891 Personal history of nicotine dependence: Secondary | ICD-10-CM | POA: Diagnosis not present

## 2018-03-18 DIAGNOSIS — K3184 Gastroparesis: Secondary | ICD-10-CM | POA: Diagnosis not present

## 2018-03-18 DIAGNOSIS — Z0000 Encounter for general adult medical examination without abnormal findings: Secondary | ICD-10-CM | POA: Diagnosis not present

## 2018-03-18 DIAGNOSIS — J449 Chronic obstructive pulmonary disease, unspecified: Secondary | ICD-10-CM | POA: Diagnosis not present

## 2018-03-18 DIAGNOSIS — G250 Essential tremor: Secondary | ICD-10-CM | POA: Diagnosis not present

## 2018-04-08 DIAGNOSIS — I10 Essential (primary) hypertension: Secondary | ICD-10-CM | POA: Diagnosis not present

## 2018-04-08 DIAGNOSIS — J449 Chronic obstructive pulmonary disease, unspecified: Secondary | ICD-10-CM | POA: Diagnosis not present

## 2018-05-07 DIAGNOSIS — Z5181 Encounter for therapeutic drug level monitoring: Secondary | ICD-10-CM | POA: Diagnosis not present

## 2018-05-07 DIAGNOSIS — M961 Postlaminectomy syndrome, not elsewhere classified: Secondary | ICD-10-CM | POA: Diagnosis not present

## 2018-05-07 DIAGNOSIS — M545 Low back pain: Secondary | ICD-10-CM | POA: Diagnosis not present

## 2018-05-07 DIAGNOSIS — G894 Chronic pain syndrome: Secondary | ICD-10-CM | POA: Diagnosis not present

## 2018-06-10 DIAGNOSIS — K3184 Gastroparesis: Secondary | ICD-10-CM | POA: Diagnosis not present

## 2018-06-10 DIAGNOSIS — G250 Essential tremor: Secondary | ICD-10-CM | POA: Diagnosis not present

## 2018-06-10 DIAGNOSIS — J449 Chronic obstructive pulmonary disease, unspecified: Secondary | ICD-10-CM | POA: Diagnosis not present

## 2018-06-10 DIAGNOSIS — I10 Essential (primary) hypertension: Secondary | ICD-10-CM | POA: Diagnosis not present

## 2018-06-10 DIAGNOSIS — E785 Hyperlipidemia, unspecified: Secondary | ICD-10-CM | POA: Diagnosis not present

## 2018-06-10 DIAGNOSIS — Z87891 Personal history of nicotine dependence: Secondary | ICD-10-CM | POA: Diagnosis not present

## 2018-06-10 DIAGNOSIS — E119 Type 2 diabetes mellitus without complications: Secondary | ICD-10-CM | POA: Diagnosis not present

## 2018-07-08 ENCOUNTER — Other Ambulatory Visit: Payer: Self-pay

## 2018-07-08 ENCOUNTER — Encounter (HOSPITAL_COMMUNITY): Payer: Self-pay | Admitting: Emergency Medicine

## 2018-07-08 ENCOUNTER — Observation Stay (HOSPITAL_COMMUNITY)
Admission: EM | Admit: 2018-07-08 | Discharge: 2018-07-09 | Disposition: A | Discharge status: Home / Self Care | Payer: PPO | Attending: Internal Medicine | Admitting: Internal Medicine

## 2018-07-08 ENCOUNTER — Emergency Department (HOSPITAL_COMMUNITY): Payer: PPO

## 2018-07-08 DIAGNOSIS — Z9049 Acquired absence of other specified parts of digestive tract: Secondary | ICD-10-CM | POA: Diagnosis not present

## 2018-07-08 DIAGNOSIS — I440 Atrioventricular block, first degree: Secondary | ICD-10-CM | POA: Diagnosis not present

## 2018-07-08 DIAGNOSIS — F17210 Nicotine dependence, cigarettes, uncomplicated: Secondary | ICD-10-CM | POA: Diagnosis not present

## 2018-07-08 DIAGNOSIS — Z87891 Personal history of nicotine dependence: Secondary | ICD-10-CM | POA: Diagnosis not present

## 2018-07-08 DIAGNOSIS — I10 Essential (primary) hypertension: Secondary | ICD-10-CM | POA: Diagnosis present

## 2018-07-08 DIAGNOSIS — Z79899 Other long term (current) drug therapy: Secondary | ICD-10-CM | POA: Insufficient documentation

## 2018-07-08 DIAGNOSIS — R079 Chest pain, unspecified: Secondary | ICD-10-CM

## 2018-07-08 DIAGNOSIS — E785 Hyperlipidemia, unspecified: Secondary | ICD-10-CM | POA: Diagnosis not present

## 2018-07-08 DIAGNOSIS — E7800 Pure hypercholesterolemia, unspecified: Secondary | ICD-10-CM | POA: Insufficient documentation

## 2018-07-08 DIAGNOSIS — E119 Type 2 diabetes mellitus without complications: Secondary | ICD-10-CM | POA: Diagnosis not present

## 2018-07-08 DIAGNOSIS — Z79891 Long term (current) use of opiate analgesic: Secondary | ICD-10-CM | POA: Insufficient documentation

## 2018-07-08 DIAGNOSIS — Z7984 Long term (current) use of oral hypoglycemic drugs: Secondary | ICD-10-CM | POA: Diagnosis not present

## 2018-07-08 DIAGNOSIS — K3184 Gastroparesis: Secondary | ICD-10-CM | POA: Diagnosis not present

## 2018-07-08 DIAGNOSIS — Z888 Allergy status to other drugs, medicaments and biological substances status: Secondary | ICD-10-CM | POA: Diagnosis not present

## 2018-07-08 DIAGNOSIS — J449 Chronic obstructive pulmonary disease, unspecified: Secondary | ICD-10-CM | POA: Diagnosis not present

## 2018-07-08 DIAGNOSIS — Z7982 Long term (current) use of aspirin: Secondary | ICD-10-CM | POA: Insufficient documentation

## 2018-07-08 DIAGNOSIS — R0789 Other chest pain: Secondary | ICD-10-CM | POA: Diagnosis not present

## 2018-07-08 DIAGNOSIS — Z8249 Family history of ischemic heart disease and other diseases of the circulatory system: Secondary | ICD-10-CM | POA: Diagnosis not present

## 2018-07-08 DIAGNOSIS — Z885 Allergy status to narcotic agent status: Secondary | ICD-10-CM | POA: Diagnosis not present

## 2018-07-08 DIAGNOSIS — R0602 Shortness of breath: Secondary | ICD-10-CM | POA: Diagnosis not present

## 2018-07-08 DIAGNOSIS — G4700 Insomnia, unspecified: Secondary | ICD-10-CM | POA: Insufficient documentation

## 2018-07-08 DIAGNOSIS — G250 Essential tremor: Secondary | ICD-10-CM | POA: Diagnosis not present

## 2018-07-08 DIAGNOSIS — M1990 Unspecified osteoarthritis, unspecified site: Secondary | ICD-10-CM | POA: Diagnosis not present

## 2018-07-08 DIAGNOSIS — G8929 Other chronic pain: Secondary | ICD-10-CM | POA: Diagnosis not present

## 2018-07-08 DIAGNOSIS — R072 Precordial pain: Secondary | ICD-10-CM | POA: Diagnosis not present

## 2018-07-08 DIAGNOSIS — I480 Paroxysmal atrial fibrillation: Secondary | ICD-10-CM

## 2018-07-08 LAB — BASIC METABOLIC PANEL
Anion gap: 13 (ref 5–15)
BUN: 26 mg/dL — AB (ref 8–23)
CHLORIDE: 104 mmol/L (ref 98–111)
CO2: 21 mmol/L — ABNORMAL LOW (ref 22–32)
CREATININE: 1.03 mg/dL — AB (ref 0.44–1.00)
Calcium: 10 mg/dL (ref 8.9–10.3)
GFR calc Af Amer: >60 mL/min (ref >60)
GFR calc non Af Amer: 55 mL/min — ABNORMAL LOW (ref >60)
GLUCOSE: 193 mg/dL — AB (ref 70–99)
Potassium: 4.2 mmol/L (ref 3.5–5.1)
SODIUM: 138 mmol/L (ref 135–145)

## 2018-07-08 LAB — I-STAT TROPONIN, ED
TROPONIN I, POC: 0 ng/mL (ref 0.00–0.08)
Troponin i, poc: 0 ng/mL (ref 0.00–0.08)

## 2018-07-08 LAB — CBC
HEMATOCRIT: 41 % (ref 36.0–46.0)
Hemoglobin: 12.8 g/dL (ref 12.0–15.0)
MCH: 29 pg (ref 26.0–34.0)
MCHC: 31.2 g/dL (ref 30.0–36.0)
MCV: 93 fL (ref 78.0–100.0)
PLATELETS: 286 10*3/uL (ref 150–400)
RBC: 4.41 MIL/uL (ref 3.87–5.11)
RDW: 12.9 % (ref 11.5–15.5)
WBC: 10.5 10*3/uL (ref 4.0–10.5)

## 2018-07-08 LAB — CBG MONITORING, ED: GLUCOSE-CAPILLARY: 148 mg/dL — AB (ref 70–99)

## 2018-07-08 LAB — MAGNESIUM: Magnesium: 2.1 mg/dL (ref 1.7–2.4)

## 2018-07-08 MED ORDER — AMITRIPTYLINE HCL 25 MG PO TABS
25.0000 mg | ORAL_TABLET | Freq: Every day | ORAL | Status: DC
  Administered 2018-07-09: 25 mg via ORAL
  Filled 2018-07-08 (×2): qty 1

## 2018-07-08 MED ORDER — NITROGLYCERIN 0.4 MG SL SUBL: 0.4000 mg | SUBLINGUAL_TABLET | SUBLINGUAL | Status: DC | PRN

## 2018-07-08 MED ORDER — SODIUM CHLORIDE 0.9 % IV SOLN
INTRAVENOUS | Status: DC
  Administered 2018-07-08: 23:00:00 via INTRAVENOUS

## 2018-07-08 MED ORDER — ENOXAPARIN SODIUM 40 MG/0.4ML ~~LOC~~ SOLN
40.0000 mg | SUBCUTANEOUS | Status: DC
  Administered 2018-07-09: 40 mg via SUBCUTANEOUS
  Filled 2018-07-08: qty 0.4

## 2018-07-08 MED ORDER — LEVETIRACETAM 500 MG PO TABS
500.0000 mg | ORAL_TABLET | Freq: Every day | ORAL | Status: DC
  Administered 2018-07-08 – 2018-07-09 (×2): 500 mg via ORAL
  Filled 2018-07-08 (×2): qty 1

## 2018-07-08 MED ORDER — OXYCODONE HCL 5 MG PO TABS
5.0000 mg | ORAL_TABLET | Freq: Two times a day (BID) | ORAL | Status: DC
  Administered 2018-07-08 – 2018-07-09 (×2): 5 mg via ORAL
  Filled 2018-07-08 (×2): qty 1

## 2018-07-08 MED ORDER — INSULIN ASPART 100 UNIT/ML ~~LOC~~ SOLN
0.0000 [IU] | SUBCUTANEOUS | Status: DC
  Administered 2018-07-08 – 2018-07-09 (×3): 1 [IU] via SUBCUTANEOUS
  Filled 2018-07-08: qty 1

## 2018-07-08 MED ORDER — FENOFIBRATE 54 MG PO TABS
54.0000 mg | ORAL_TABLET | Freq: Every day | ORAL | Status: DC
  Administered 2018-07-09: 54 mg via ORAL
  Filled 2018-07-08: qty 1

## 2018-07-08 MED ORDER — QUINAPRIL HCL 10 MG PO TABS
20.0000 mg | ORAL_TABLET | Freq: Every day | ORAL | Status: DC
  Administered 2018-07-09: 20 mg via ORAL
  Filled 2018-07-08: qty 2

## 2018-07-08 MED ORDER — BACLOFEN 5 MG HALF TABLET
10.0000 mg | ORAL_TABLET | Freq: Two times a day (BID) | ORAL | Status: DC
  Administered 2018-07-08 – 2018-07-09 (×2): 10 mg via ORAL
  Filled 2018-07-08 (×2): qty 2

## 2018-07-08 MED ORDER — HYDROMORPHONE HCL 1 MG/ML IJ SOLN
0.5000 mg | INTRAMUSCULAR | Status: DC | PRN
  Administered 2018-07-09 (×3): 0.5 mg via INTRAVENOUS
  Filled 2018-07-08 (×3): qty 0.5

## 2018-07-08 MED ORDER — ALBUTEROL SULFATE (2.5 MG/3ML) 0.083% IN NEBU
3.0000 mL | INHALATION_SOLUTION | Freq: Two times a day (BID) | RESPIRATORY_TRACT | Status: DC
  Administered 2018-07-09 (×2): 3 mL via RESPIRATORY_TRACT
  Filled 2018-07-08 (×2): qty 3

## 2018-07-08 MED ORDER — METOCLOPRAMIDE HCL 10 MG PO TABS
10.0000 mg | ORAL_TABLET | Freq: Three times a day (TID) | ORAL | Status: DC
  Administered 2018-07-08 – 2018-07-09 (×2): 10 mg via ORAL
  Filled 2018-07-08 (×3): qty 1

## 2018-07-08 MED ORDER — ASPIRIN EC 81 MG PO TBEC
81.0000 mg | DELAYED_RELEASE_TABLET | Freq: Every day | ORAL | Status: DC
  Administered 2018-07-09: 81 mg via ORAL
  Filled 2018-07-08: qty 1

## 2018-07-08 MED ORDER — ACETAMINOPHEN 650 MG RE SUPP: 650.0000 mg | Freq: Four times a day (QID) | RECTAL | Status: DC | PRN

## 2018-07-08 MED ORDER — ATORVASTATIN CALCIUM 40 MG PO TABS
40.0000 mg | ORAL_TABLET | Freq: Every day | ORAL | Status: DC
  Administered 2018-07-09: 40 mg via ORAL
  Filled 2018-07-08: qty 1

## 2018-07-08 MED ORDER — TRIAMTERENE-HCTZ 37.5-25 MG PO TABS
1.0000 | ORAL_TABLET | Freq: Every day | ORAL | Status: DC
  Administered 2018-07-09: 1 via ORAL
  Filled 2018-07-08: qty 1

## 2018-07-08 MED ORDER — ZOLPIDEM TARTRATE 5 MG PO TABS
5.0000 mg | ORAL_TABLET | Freq: Every day | ORAL | Status: DC
  Administered 2018-07-09: 5 mg via ORAL
  Filled 2018-07-08: qty 1

## 2018-07-08 MED ORDER — METOPROLOL TARTRATE 25 MG PO TABS
25.0000 mg | ORAL_TABLET | Freq: Every day | ORAL | Status: DC
  Administered 2018-07-09: 25 mg via ORAL
  Filled 2018-07-08: qty 1

## 2018-07-08 MED ORDER — ACETAMINOPHEN 325 MG PO TABS
650.0000 mg | ORAL_TABLET | Freq: Four times a day (QID) | ORAL | Status: DC | PRN
  Administered 2018-07-09: 650 mg via ORAL
  Filled 2018-07-08: qty 2

## 2018-07-08 MED ORDER — OXYCODONE HCL ER 10 MG PO T12A
20.0000 mg | EXTENDED_RELEASE_TABLET | Freq: Every day | ORAL | Status: DC
  Administered 2018-07-09: 20 mg via ORAL
  Filled 2018-07-08: qty 2

## 2018-07-08 MED ORDER — NITROGLYCERIN 2 % TD OINT
1.0000 [in_us] | TOPICAL_OINTMENT | Freq: Three times a day (TID) | TRANSDERMAL | Status: DC
Start: 2018-07-08 — End: 2018-07-09
  Administered 2018-07-09 (×3): 1 [in_us] via TOPICAL
  Filled 2018-07-08: qty 30

## 2018-07-08 NOTE — ED Provider Notes (Signed)
[facility] EMERGENCY DEPARTMENT Provider Note   CSN: [id number] Arrival date & time: 07/08/18  1812  History   Chief Complaint Chief Complaint  Patient presents with  . Chest Pain  . Atrial Fibrillation    HPI Nicole Pittman is a 67 y.o. female.  HPI Patient is a 67 year old female with history of COPD, hypertension, hyperlipidemia, diabetes, and chronic back pain who presents to the emergency department today from her PCP clinic for evaluation of chest pain and new onset atrial fibrillation.  Patient reports that for the past 3 or 4 days she has been having 3/10 central chest pain described as pressure.  Better with standing and sitting up.  Denies any nausea, vomiting or diaphoresis associated with this.  Has had some mild shortness of breath.  Denies any history of cardiac disease that she is aware of.  No family history of cardiac disease.  Does smoke half a pack of cigarettes a day.  Has had no fevers, chills or cough.  Does not take any anticoagulants.  No leg swelling.  No history of DVT or PE. Has also had palpitations described as pounding.  Past Medical History:  Diagnosis Date  . Adhesive arachnoiditis   . Arthritis   . Back problem    spurs  . COPD (chronic obstructive pulmonary disease) (HCC)   . Diabetes (HCC)   . High cholesterol   . Hypertension   . Neck problem    bad disc    Patient Active Problem List   Diagnosis Date Noted  . Chest pain 07/08/2018  . Tremor of face and hands 01/21/2016  . HTN (hypertension) 01/21/2016  . HLD (hyperlipidemia) 01/21/2016  . Diabetes (HCC) 01/21/2016  . COPD (chronic obstructive pulmonary disease) (HCC) 01/21/2016    Past Surgical History:  Procedure Laterality Date  . BACK SURGERY  1978   L5  . CHOLECYSTECTOMY  1992  . KNEE SURGERY Bilateral 1993  . OVARIAN CYST REMOVAL  1985  . TONSILLECTOMY  1959  . WRIST SURGERY Bilateral 2003     OB History   None      Home Medications    Prior  to Admission medications   Medication Sig Start Date End Date Taking? Authorizing Provider  amitriptyline (ELAVIL) 25 MG tablet Take 25 mg by mouth at bedtime.   Yes [provider]  aspirin 81 MG tablet Take 81 mg by mouth daily with supper.    Yes [provider]  atorvastatin (LIPITOR) 40 MG tablet Take 40 mg by mouth at bedtime.    Yes [provider]  baclofen (LIORESAL) 10 MG tablet Take 10 mg by mouth 2 (two) times daily.  01/01/16  Yes [provider]  Cholecalciferol (VITAMIN D-3 PO) Take 1 capsule by mouth daily.   Yes [provider]  fenofibrate (TRICOR) 48 MG tablet Take 48 mg by mouth at bedtime.    Yes [provider]  glipiZIDE-metformin (METAGLIP) 5-500 MG tablet Take 1 tablet by mouth 2 (two) times daily.  12/25/15  Yes [provider]  levETIRAcetam (KEPPRA) 500 MG tablet Take 500 mg by mouth daily with supper.  12/13/15  Yes [provider]  metoCLOPramide (REGLAN) 10 MG tablet Take 10 mg by mouth 4 (four) times daily.   Yes [provider]  metoprolol (LOPRESSOR) 50 MG tablet Take 25 mg by mouth daily.    Yes [provider]  oxyCODONE (OXY IR/ROXICODONE) 5 MG immediate release tablet Take 5 mg by  mouth 2 (two) times daily.  01/02/16  Yes [provider]  OXYCONTIN 20 MG 12 hr tablet Take 20 mg by mouth daily with supper.  12/19/15  Yes [provider]  PROAIR HFA 108 (90 Base) MCG/ACT inhaler Inhale 2 puffs into the lungs 2 (two) times daily.  01/03/16  Yes [provider]  quinapril (ACCUPRIL) 20 MG tablet Take 20 mg by mouth daily. 01/12/16  Yes [provider]  triamterene-hydrochlorothiazide (MAXZIDE-25) 37.5-25 MG tablet Take 1 tablet by mouth daily. 01/06/16  Yes [provider]  zolpidem (AMBIEN) 10 MG tablet Take 10 mg by mouth daily. 12/29/15  Yes [provider]  Alcohol Swabs (ALCOHOL PREP) 70 % PADS 4 (four) times daily. 12/14/15    [provider]  EASY COMFORT LANCETS MISC 4 (four) times daily. for testing 12/14/15   [provider]  FREESTYLE LITE test strip 4 (four) times daily. for testing 12/14/15   [provider]    Family History Family History  Problem Relation Age of Onset  . Rheum arthritis Mother   . Hypertension Mother   . Parkinson's disease Mother   . Parkinsonism Mother   . Diabetes Father   . Breast cancer Maternal Aunt   . Lupus Maternal Aunt     Social History Social History   Tobacco Use  . Smoking status: Current Every Day Smoker    Packs/day: 0.50    Types: Cigarettes  . Smokeless tobacco: Never Used  Substance Use Topics  . Alcohol use: No    Alcohol/week: 0.0 standard drinks  . Drug use: No     Allergies   Codeine; Morphine and related; Symbicort [budesonide-formoterol fumarate]; and Zanaflex [tizanidine]   Review of Systems Review of Systems  Constitutional: Negative for chills and fever.  HENT: Negative for congestion and sore throat.   Eyes: Negative for visual disturbance.  Respiratory: Positive for shortness of breath. Negative for cough.   Cardiovascular: Positive for chest pain and palpitations. Negative for leg swelling.  Gastrointestinal: Negative for abdominal pain, diarrhea, nausea and vomiting.  Genitourinary: Negative for dysuria and hematuria.  Musculoskeletal: Negative for neck pain. Back pain: chronic, unchanged.  Skin: Negative for color change and rash.  Neurological: Negative for weakness and headaches.  All other systems reviewed and are negative.   Physical Exam Updated Vital Signs BP (!) 179/96   Pulse (!) 102   Temp 99.2 F (37.3 C) (Oral)   Resp 20   Ht 5\' 3"  (1.6 m)   Wt 57.6 kg   SpO2 96%   BMI 22.50 kg/m   Physical Exam  Constitutional: She appears well-developed and well-nourished. No distress.  HENT:  Head: Normocephalic and atraumatic.  Eyes: Conjunctivae are normal.  Neck: Neck supple.    Cardiovascular: Normal rate and regular rhythm.  No murmur heard. Pulses:      Radial pulses are 2+ on the right side, and 2+ on the left side.  No reproducible chest wall tenderness.  Symmetric pulses in the extremities.  Pulmonary/Chest: Effort normal and breath sounds normal. No respiratory distress.  Abdominal: Soft. There is no tenderness.  Musculoskeletal: She exhibits no edema.       Right lower leg: She exhibits no edema.       Left lower leg: She exhibits no edema.  Neurological: She is alert.  Skin: Skin is warm and dry.  Psychiatric: She has a normal mood and affect.  Nursing note and vitals reviewed.    ED Treatments /  Results  Labs (all labs ordered are listed, but only abnormal results are displayed) Labs Reviewed  BASIC METABOLIC PANEL - Abnormal; Notable for the following components:      Result Value   CO2 21 (*)    Glucose, Bld 193 (*)    BUN 26 (*)    Creatinine, Ser 1.03 (*)    GFR calc non Af Amer 55 (*)    All other components within normal limits  CBG MONITORING, ED - Abnormal; Notable for the following components:   Glucose-Capillary 148 (*)    All other components within normal limits  MRSA PCR SCREENING  CBC  MAGNESIUM  HIV ANTIBODY (ROUTINE TESTING)  COMPREHENSIVE METABOLIC PANEL  CBC  D-DIMER, QUANTITATIVE (NOT AT [facility])  TROPONIN I  TROPONIN I  TROPONIN I  LIPID PANEL  HEMOGLOBIN A1C  I-STAT TROPONIN, ED  I-STAT TROPONIN, ED    EKG EKG Interpretation  Date/Time:  Wednesday July 08 2018 18:16:59 EDT Ventricular Rate:  98 PR Interval:  204 QRS Duration: 82 QT Interval:  384 QTC Calculation: 490 R Axis:   52 Text Interpretation:    Normal sinus rhythm ST-t wave abnormality Abnormal ekg Confirmed by Gerhard MunchLockwood, Robert [phone]([id number]) on 07/08/2018 9:04:14 PM   Radiology Dg Chest 2 View  Result Date: 07/08/2018 CLINICAL DATA:  Chest pain and shortness of breath EXAM: CHEST - 2 VIEW COMPARISON:  None. FINDINGS: The lungs are hyperinflated  with diffuse interstitial prominence. No focal airspace consolidation or pulmonary edema. No pleural effusion or pneumothorax. Normal cardiomediastinal contours. IMPRESSION: COPD without acute airspace disease. Electronically Signed   By: Deatra RobinsonKevin  Herman M.D.   On: 07/08/2018 19:47    Procedures Procedures (including critical care time)  Medications Ordered in ED Medications  nitroGLYCERIN (NITROSTAT) SL tablet 0.4 mg (has no administration in time range)  enoxaparin (LOVENOX) injection 40 mg (has no administration in time range)  0.9 %  sodium chloride infusion ( Intravenous New Bag/Given 07/08/18 2247)  acetaminophen (TYLENOL) tablet 650 mg (has no administration in time range)    Or  acetaminophen (TYLENOL) suppository 650 mg (has no administration in time range)  insulin aspart (novoLOG) injection 0-9 Units (1 Units Subcutaneous Given 07/08/18 2243)  nitroGLYCERIN (NITROGLYN) 2 % ointment 1 inch (has no administration in time range)  HYDROmorphone (DILAUDID) injection 0.5 mg (has no administration in time range)  aspirin EC tablet 81 mg (has no administration in time range)  oxyCODONE (Oxy IR/ROXICODONE) immediate release tablet 5 mg (5 mg Oral Given 07/08/18 2356)  oxyCODONE (OXYCONTIN) 12 hr tablet 20 mg (has no administration in time range)  atorvastatin (LIPITOR) tablet 40 mg (40 mg Oral Given 07/09/18 0000)  fenofibrate tablet 54 mg (has no administration in time range)  metoprolol tartrate (LOPRESSOR) tablet 25 mg (has no administration in time range)  quinapril (ACCUPRIL) tablet 20 mg (has no administration in time range)  triamterene-hydrochlorothiazide (MAXZIDE-25) 37.5-25 MG per tablet 1 tablet (has no administration in time range)  amitriptyline (ELAVIL) tablet 25 mg (has no administration in time range)  zolpidem (AMBIEN) tablet 5 mg (5 mg Oral Given 07/09/18 0000)  metoCLOPramide (REGLAN) tablet 10 mg (10 mg Oral Given 07/08/18 2359)  baclofen (LIORESAL) tablet 10 mg (10 mg Oral  Given 07/08/18 2357)  levETIRAcetam (KEPPRA) tablet 500 mg (500 mg Oral Given 07/08/18 2358)  albuterol (PROVENTIL) (2.5 MG/3ML) 0.083% nebulizer solution 3 mL (has no administration in time range)     Initial Impression / Assessment and Plan / ED Course  I  have reviewed the triage vital signs and the nursing notes.  Pertinent labs & imaging results that were available during my care of the patient were reviewed by me and considered in my medical decision making (see chart for details).    Patient is a 67 year old female with history of COPD, hypertension, hyperlipidemia, diabetes, and chronic back pain who presents to the emergency department today from her PCP clinic for evaluation of chest pain and new onset atrial fibrillation.   Patient afebrile, hypertensive but otherwise hemoglobin stable presentation.  No distress.  Resting carefully.  ECG reviewed with rate of approximate 98, sinus rhythm, normal axis, PR/QTc interval slightly prolonged, no significant ST elevations/depressions. Does have nonspecific ST and T changes. HEART pathway score of 5 placing at higher risk for ACS. No known history of CAD.    CXR with no acute cardiopulm abnormalities. No widening of mediastinum and history/exam not consistent with dissection. Also no consolidation to suggest PNA. No PTX or effusions. Lungs are hyperexpanded consistent with COPD though no symptoms to suggest COPD exacerbation at this time. Labs from triage reviewed including BMP, CBC, troponin.  Troponin was undetectable.  Will obtain delta troponin.  BMP with creatinine of 1.03 up from 0.85.  No other significant abnormalities appreciated.  Is hyperglycemic to 193.  CBC is unremarkable.  CHA2DS2-VASC score of 3. Low risk by HAS-BLED (1 pt for aspirin use).   Given high risk by HEART pathway and reported afib by PCP (though no afib on ECG or monitor in ED), will admit pt for further obs and workup. Pt evaluated by medicine team in ED. Stable time  of transfer to inpt room.    Case and plan of care discussed with Dr. Jeraldine LootsLockwood.   Final Clinical Impressions(s) / ED Diagnoses   Final diagnoses:  Paroxysmal atrial fibrillation [facility](HCC)  Chest pain, precordial    ED Discharge Orders    None       Rigoberto Noelickens, Latangela Mccomas, MD 07/09/18 Ivor Reining0003    Gerhard MunchLockwood, Robert, MD 07/11/18 2055

## 2018-07-08 NOTE — ED Triage Notes (Signed)
Pt reports 3/10 central cp that started 3 days ago, went to doctors today and they sent her here for irregular heart beat/afib. No hx of afib. Pt takes 81mg  asa daily.

## 2018-07-08 NOTE — H&P (Signed)
TRH H&P   Patient Demographics:    Nicole Pittman, is a 67 y.o. female  MRN: [id number]   DOB - July 27, 1951  Admit Date - 07/08/2018  Outpatient Primary MD for the patient is No primary care provider on file. Vaughan Sine PA  Referring MD/NP/PA:   Gerhard Munch  Outpatient Specialists:     Patient coming from: home pcp office => ER  Chief Complaint  Patient presents with  . Chest Pain  . Atrial Fibrillation      HPI:    Nicole Pittman  is a 67 y.o. female, w hypertension, hyperlipidemia, dm2, presents with c/o chest pain, x 3 days, substernal without radiation.  + slight dyspnea.  Slight dry cough. Pt denies fever, chills, n/v, diarrhea, brbpr, black stool.  Pt seen in office by NP [facility] and told to go to ER for evaluation of chest pain and ? Afib   In ED,  nsr at 98, nl axis, 1st degree avb, no st-t change c/w ischemia  CXR IMPRESSION: COPD without acute airspace disease.  Na 138, K 4.2 Bun 26, Creatinine 1.03 Wbc 10,5 Hgb 12.8, Plt 286  Trop 0.00  Pt will be admitted for chest pain rule out.    Review of systems:    In addition to the HPI above,  No Fever-chills, No Headache, No changes with Vision or hearing, No problems swallowing food or Liquids,  No Abdominal pain, No Nausea or Vommitting, Bowel movements are regular, No Blood in stool or Urine, No dysuria, No new skin rashes or bruises, No new joints pains-aches,  No new weakness, tingling, numbness in any extremity, No recent weight gain or loss, No polyuria, polydypsia or polyphagia, No significant Mental Stressors.  A full 10 point Review of Systems was done, except as stated above, all other Review of Systems were negative.   With Past History of the following :    Past Medical History:  Diagnosis Date  . Adhesive arachnoiditis   . Arthritis   . Back problem    spurs    . COPD (chronic obstructive pulmonary disease) (HCC)   . Diabetes (HCC)   . High cholesterol   . Hypertension   . Neck problem    bad disc      Past Surgical History:  Procedure Laterality Date  . BACK SURGERY  1978   L5  . CHOLECYSTECTOMY  1992  . KNEE SURGERY Bilateral 1993  . OVARIAN CYST REMOVAL  1985  . TONSILLECTOMY  1959  . WRIST SURGERY Bilateral 2003      Social History:     Social History   Tobacco Use  . Smoking status: Current Every Day Smoker    Packs/day: 0.50    Types: Cigarettes  . Smokeless tobacco: Never Used  Substance Use Topics  . Alcohol use: No    Alcohol/week: 0.0 standard drinks  Lives - at home  Mobility - walks by self   Family History :     Family History  Problem Relation Age of Onset  . Rheum arthritis Mother   . Hypertension Mother   . Parkinson's disease Mother   . Parkinsonism Mother   . Diabetes Father   . Breast cancer Maternal Aunt   . Lupus Maternal Aunt       Home Medications:   Prior to Admission medications   Medication Sig Start Date End Date Taking? Authorizing Provider  Alcohol Swabs (ALCOHOL PREP) 70 % PADS 4 (four) times daily. 12/14/15   [provider]  amitriptyline (ELAVIL) 25 MG tablet Take 25 mg by mouth at bedtime.    [provider]  aspirin 81 MG tablet Take 81 mg by mouth daily.    [provider]  atorvastatin (LIPITOR) 40 MG tablet Take 40 mg by mouth daily.    [provider]  baclofen (LIORESAL) 10 MG tablet Take 10 mg by mouth 3 (three) times daily. 01/01/16   [provider]  EASY COMFORT LANCETS MISC 4 (four) times daily. for testing 12/14/15   [provider]  fenofibrate (TRICOR) 48 MG tablet Take 48 mg by mouth daily.    [provider]  FREESTYLE LITE test strip 4 (four) times daily. for testing 12/14/15   [provider]  glipiZIDE-metformin (METAGLIP) 5-500 MG tablet Take 2 tablets by mouth 2 (two) times daily.  12/25/15   [provider]  levETIRAcetam (KEPPRA) 500 MG tablet Take 500 mg by mouth 3 (three) times daily. 12/13/15   [provider]  metoCLOPramide (REGLAN) 10 MG tablet Take 10 mg by mouth 4 (four) times daily.    [provider]  metoprolol (LOPRESSOR) 50 MG tablet Take 25 mg by mouth daily.     [provider]  oxyCODONE (OXY IR/ROXICODONE) 5 MG immediate release tablet Take 5 mg by mouth every 6 (six) hours as needed (for pain).  01/02/16   [provider]  OXYCONTIN 20 MG 12 hr tablet Take 20 mg by mouth at bedtime. 12/19/15   [provider]  PROAIR HFA 108 (90 Base) MCG/ACT inhaler Take 2 puffs by mouth 4 (four) times daily. For 30 days 01/03/16   [provider]  quinapril (ACCUPRIL) 20 MG tablet Take 20 mg by mouth daily. 01/12/16   [provider]  SYMBICORT 80-4.5 MCG/ACT inhaler Inhale 2 puffs into the lungs 2 (two) times daily.  12/11/15   [provider]  tiZANidine (ZANAFLEX) 4 MG tablet Take 4 mg by mouth 3 (three) times daily. 06/04/18   [provider]  triamterene-hydrochlorothiazide (MAXZIDE-25) 37.5-25 MG tablet Take 1 tablet by mouth daily. 01/06/16   [provider]  Vitamin D, Ergocalciferol, (DRISDOL) 50000 units CAPS capsule Take 50,000 Units by mouth 2 (two) times a week.    [provider]  zolpidem (AMBIEN) 10 MG tablet Take 10 mg by mouth daily. 12/29/15   [provider]     Allergies:     Allergies  Allergen Reactions  . Codeine Hives  . Morphine And Related Hives  . Zanaflex [Tizanidine] Palpitations and Other (See Comments)    And worsens insomina     Physical Exam:   Vitals  Blood pressure (!) 161/74, pulse 91, temperature 99.2 F (37.3 C), temperature source Oral, resp. rate 18, height 5\' 3"  (1.6 m), weight 57.6 kg, SpO2 95 %.   1. General  lying in bed  in NAD,    2. Normal affect and insight, Not Suicidal or Homicidal, Awake Alert,  Oriented X 3.  3. No F.N deficits, ALL C.Nerves Intact, Strength 5/5 all 4 extremities, Sensation intact all 4 extremities, Plantars down going.  4. Ears and Eyes appear Normal, Conjunctivae clear, PERRLA. Moist Oral Mucosa.  5. Supple Neck, No JVD, No cervical lymphadenopathy appriciated, No Carotid Bruits.  6. Symmetrical Chest wall movement, Good air movement bilaterally, CTAB.  7. Chest pain reproducible with palpation RRR, No Gallops, Rubs or Murmurs, No Parasternal Heave.  8. Positive Bowel Sounds, Abdomen Soft, No tenderness, No organomegaly appriciated,No rebound -guarding or rigidity.  9.  No Cyanosis, Normal Skin Turgor, No Skin Rash or Bruise.  10. Good muscle tone,  joints appear normal , no effusions, Normal ROM.  11. No Palpable Lymph Nodes in Neck or Axillae     Data Review:    CBC Recent Labs  Lab 07/08/18 1829  WBC 10.5  HGB 12.8  HCT 41.0  PLT 286  MCV 93.0  MCH 29.0  MCHC 31.2  RDW 12.9   ------------------------------------------------------------------------------------------------------------------  Chemistries  Recent Labs  Lab 07/08/18 1829  NA 138  K 4.2  CL 104  CO2 21*  GLUCOSE 193*  BUN 26*  CREATININE 1.03*  CALCIUM 10.0   ------------------------------------------------------------------------------------------------------------------ estimated creatinine clearance is 43.2 mL/min (A) (by C-G formula based on SCr of 1.03 mg/dL (H)). ------------------------------------------------------------------------------------------------------------------ No results for input(s): TSH, T4TOTAL, T3FREE, THYROIDAB in the last 72 hours.  Invalid input(s): FREET3  Coagulation profile No results for input(s): INR, PROTIME in the last 168 hours. ------------------------------------------------------------------------------------------------------------------- No results for input(s): DDIMER in the last 72  hours. -------------------------------------------------------------------------------------------------------------------  Cardiac Enzymes No results for input(s): CKMB, TROPONINI, MYOGLOBIN in the last 168 hours.  Invalid input(s): CK ------------------------------------------------------------------------------------------------------------------ No results found for: BNP   ---------------------------------------------------------------------------------------------------------------  Urinalysis No results found for: COLORURINE, APPEARANCEUR, LABSPEC, PHURINE, GLUCOSEU, HGBUR, BILIRUBINUR, KETONESUR, PROTEINUR, UROBILINOGEN, NITRITE, LEUKOCYTESUR  ----------------------------------------------------------------------------------------------------------------   Imaging Results:    Dg Chest 2 View  Result Date: 07/08/2018 CLINICAL DATA:  Chest pain and shortness of breath EXAM: CHEST - 2 VIEW COMPARISON:  None. FINDINGS: The lungs are hyperinflated with diffuse interstitial prominence. No focal airspace consolidation or pulmonary edema. No pleural effusion or pneumothorax. Normal cardiomediastinal contours. IMPRESSION: COPD without acute airspace disease. Electronically Signed   By: Deatra RobinsonKevin  Herman M.D.   On: 07/08/2018 19:47       Assessment & Plan:    Active Problems:   HTN (hypertension)   HLD (hyperlipidemia)   Diabetes (HCC)   Chest pain   Chest pain (reproducible with palpation), but having multiple risk factors including diabetes, hypertension, hyperlipidemia, smoking Tele Trop I q6h x3 Check cardiac echo Check hga1c, lipid Cont aspirin Cont metoprolol 25mg  po qday Cont Quinapril 20mg  po qday Cont Fenofibrate 48=> 54mg  po qday Cont Atorvastatin 40mg  po qhs Start nitropaste 1 inch topically q8h Dilaudid 0.5mg  iv q4h prn pain NPO after [facility] consult requested by Email for evaluation due to risk factors    Pafib Please obtain EKG from pcp Dr. Cloyd Stagerssei  [facility]onsu's office.  To confirm or disaffirm Atrial fibrillation Check TSH Check cardiac echo as above  Hypertension Cont Maxide 25mg  po qday  Dm2 HOLD off on glipizide / metformin fsbs q4h, ISS  Chronic neck pain Cont oxycontin 20mg  po qday Cont oxycodone 5mg  po bid  Copd Cont Proair 2puff q6h prn   Insomnia Ambien 10mg  po qhs prn   ? Seizure do Cont  keppra 500mg  po qday      DVT Prophylaxis    Lovenox - SCDs  AM Labs Ordered, also please review Full Orders  Family Communication: Admission, patients condition and plan of care including tests being ordered have been discussed with the patient  who indicate understanding and agree with the plan and Code Status.  Code Status  FULL CODE  Likely DC to  home  Condition GUARDED    Consults called: cardiology by email  Admission status: observation  Time spent in minutes : 17   Pearson Grippe M.D on 07/08/2018 at 10:09 PM  Between 7am to 7pm - Pager - [phone]   After 7pm go to [email or web address] - password [facility]  [facility] - Office  [phone]

## 2018-07-09 ENCOUNTER — Other Ambulatory Visit: Payer: Self-pay

## 2018-07-09 ENCOUNTER — Observation Stay (HOSPITAL_BASED_OUTPATIENT_CLINIC_OR_DEPARTMENT_OTHER): Payer: PPO

## 2018-07-09 DIAGNOSIS — R072 Precordial pain: Secondary | ICD-10-CM | POA: Diagnosis not present

## 2018-07-09 DIAGNOSIS — I361 Nonrheumatic tricuspid (valve) insufficiency: Secondary | ICD-10-CM

## 2018-07-09 DIAGNOSIS — E785 Hyperlipidemia, unspecified: Secondary | ICD-10-CM | POA: Diagnosis not present

## 2018-07-09 DIAGNOSIS — I480 Paroxysmal atrial fibrillation: Secondary | ICD-10-CM | POA: Diagnosis not present

## 2018-07-09 DIAGNOSIS — I10 Essential (primary) hypertension: Secondary | ICD-10-CM | POA: Diagnosis not present

## 2018-07-09 DIAGNOSIS — E119 Type 2 diabetes mellitus without complications: Secondary | ICD-10-CM

## 2018-07-09 LAB — CBC
HCT: 37 % (ref 36.0–46.0)
Hemoglobin: 12 g/dL (ref 12.0–15.0)
MCH: 29.2 pg (ref 26.0–34.0)
MCHC: 32.4 g/dL (ref 30.0–36.0)
MCV: 90 fL (ref 78.0–100.0)
PLATELETS: 290 10*3/uL (ref 150–400)
RBC: 4.11 MIL/uL (ref 3.87–5.11)
RDW: 12.7 % (ref 11.5–15.5)
WBC: 10.7 10*3/uL — AB (ref 4.0–10.5)

## 2018-07-09 LAB — LIPID PANEL
CHOL/HDL RATIO: 2.9 ratio
Cholesterol: 141 mg/dL (ref 0–200)
HDL: 48 mg/dL (ref >40)
LDL CALC: 63 mg/dL (ref 0–99)
Triglycerides: 150 mg/dL — ABNORMAL HIGH (ref <150)
VLDL: 30 mg/dL (ref 0–40)

## 2018-07-09 LAB — TROPONIN I: Troponin I: <0.03 ng/mL (ref <0.03)

## 2018-07-09 LAB — GLUCOSE, CAPILLARY
GLUCOSE-CAPILLARY: 124 mg/dL — AB (ref 70–99)
Glucose-Capillary: 125 mg/dL — ABNORMAL HIGH (ref 70–99)
Glucose-Capillary: 82 mg/dL (ref 70–99)
Glucose-Capillary: 232 mg/dL — ABNORMAL HIGH (ref 70–99)

## 2018-07-09 LAB — HEMOGLOBIN A1C
HEMOGLOBIN A1C: 7 % — AB (ref 4.8–5.6)
Mean Plasma Glucose: 154.2 mg/dL

## 2018-07-09 LAB — COMPREHENSIVE METABOLIC PANEL
ALBUMIN: 4.1 g/dL (ref 3.5–5.0)
ALK PHOS: 72 U/L (ref 38–126)
ALT: 26 U/L (ref 0–44)
AST: 21 U/L (ref 15–41)
Anion gap: 13 (ref 5–15)
BILIRUBIN TOTAL: 0.5 mg/dL (ref 0.3–1.2)
BUN: 22 mg/dL (ref 8–23)
CALCIUM: 9.7 mg/dL (ref 8.9–10.3)
CO2: 23 mmol/L (ref 22–32)
Chloride: 103 mmol/L (ref 98–111)
Creatinine, Ser: 0.97 mg/dL (ref 0.44–1.00)
GFR calc Af Amer: >60 mL/min (ref >60)
GFR, EST NON AFRICAN AMERICAN: 59 mL/min — AB (ref >60)
GLUCOSE: 132 mg/dL — AB (ref 70–99)
Potassium: 4.1 mmol/L (ref 3.5–5.1)
Sodium: 139 mmol/L (ref 135–145)
TOTAL PROTEIN: 7.3 g/dL (ref 6.5–8.1)

## 2018-07-09 LAB — T4, FREE: FREE T4: 0.99 ng/dL (ref 0.82–1.77)

## 2018-07-09 LAB — ECHOCARDIOGRAM COMPLETE
Height: 63 in
Weight: 2000 oz

## 2018-07-09 LAB — D-DIMER, QUANTITATIVE (NOT AT ARMC)

## 2018-07-09 LAB — MRSA PCR SCREENING: MRSA by PCR: NEGATIVE

## 2018-07-09 LAB — HIV ANTIBODY (ROUTINE TESTING W REFLEX): HIV Screen 4th Generation wRfx: NONREACTIVE

## 2018-07-09 LAB — TSH: TSH: 1.28 u[IU]/mL (ref 0.350–4.500)

## 2018-07-09 MED ORDER — METOPROLOL TARTRATE 25 MG PO TABS: 25.0000 mg | ORAL_TABLET | Freq: Two times a day (BID) | ORAL | 0 refills | Status: DC

## 2018-07-09 MED ORDER — GLIPIZIDE 5 MG PO TABS
5.0000 mg | ORAL_TABLET | Freq: Two times a day (BID) | ORAL | Status: DC
  Administered 2018-07-09: 5 mg via ORAL
  Filled 2018-07-09: qty 1

## 2018-07-09 MED ORDER — METOPROLOL TARTRATE 25 MG PO TABS: 25.0000 mg | ORAL_TABLET | Freq: Two times a day (BID) | ORAL | Status: DC

## 2018-07-09 MED ORDER — METFORMIN HCL 500 MG PO TABS
500.0000 mg | ORAL_TABLET | Freq: Two times a day (BID) | ORAL | Status: DC
  Administered 2018-07-09: 500 mg via ORAL
  Filled 2018-07-09: qty 1

## 2018-07-09 MED ORDER — GLIPIZIDE-METFORMIN HCL 5-500 MG PO TABS: 1.0000 | ORAL_TABLET | Freq: Two times a day (BID) | ORAL | Status: DC

## 2018-07-09 NOTE — Discharge Instructions (Signed)
Acute Pain, Adult  Acute pain is a type of pain that may last for just a few days or as long as six months. It is often related to an illness, injury, or medical procedure. Acute pain may be mild, moderate, or severe. It usually goes away once your injury has healed or you are no longer ill.  Pain can make it hard for you to do daily activities. It can cause anxiety and lead to other problems if left untreated. Treatment depends on the cause and severity of your acute pain.  Follow these instructions at home:   Check your pain level as told by your health care provider.   Take over-the-counter and prescription medicines only as told by your health care provider.   If you are taking prescription pain medicine:  ? Ask your health care provider about taking a stool softener or laxative to prevent constipation.  ? Do not stop taking the medicine suddenly. Talk to your health care provider about how and when to discontinue prescription pain medicine.  ? If your pain is severe, do not take more pills than instructed by your health care provider.  ? Do not take other over-the-counter pain medicines in addition to this medicine unless told by your health care provider.  ? Do not drive or operate heavy machinery while taking prescription pain medicine.   Apply ice or heat as told by your health care provider. These may reduce swelling and pain.   Ask your health care provider if other strategies such as distraction, relaxation, or physical therapies can help your pain.   Keep all follow-up visits as told by your health care provider. This is important.  Contact a health care provider if:   You have pain that is not controlled by medicine.   Your pain does not improve or gets worse.   You have side effects from pain medicines, such as vomitingor confusion.  Get help right away if:   You have severe pain.   You have trouble breathing.   You lose consciousness.   You have chest pain or pressure that lasts for more  than a few minutes. Along with the chest pain you may:  ? Have pain or discomfort in one or both arms, your back, neck, jaw, or stomach.  ? Have shortness of breath.  ? Break out in a cold sweat.  ? Feel nauseous.  ? Become light-headed.  These symptoms may represent a serious problem that is an emergency. Do not wait to see if the symptoms will go away. Get medical help right away. Call your local emergency services (911 in the [place] Do not drive yourself to the hospital.  This information is not intended to replace advice given to you by your health care provider. Make sure you discuss any questions you have with your health care provider.  Document Released: 11/26/2015 Document Revised: 04/19/2016 Document Reviewed: 11/26/2015  [employer]vier Interactive Patient Education  2018 [employer]

## 2018-07-09 NOTE — Progress Notes (Signed)
Pt being discharged home with family via w/c with belongings. No complaints.

## 2018-07-09 NOTE — Plan of Care (Signed)
  Problem: Education: Goal: Knowledge of General Education information will improve Description Including pain rating scale, medication(s)/side effects and non-pharmacologic comfort measures 07/09/2018 1703 by Don Perking, RN Outcome: Completed/Met 07/09/2018 0855 by Don Perking, RN Outcome: Progressing   Problem: Health Behavior/Discharge Planning: Goal: Ability to manage health-related needs will improve 07/09/2018 1703 by Don Perking, RN Outcome: Completed/Met 07/09/2018 0855 by Don Perking, RN Outcome: Progressing   Problem: Clinical Measurements: Goal: Ability to maintain clinical measurements within normal limits will improve 07/09/2018 1703 by Don Perking, RN Outcome: Completed/Met 07/09/2018 0855 by Don Perking, RN Outcome: Progressing Goal: Will remain free from infection 07/09/2018 1703 by Don Perking, RN Outcome: Completed/Met 07/09/2018 0855 by Don Perking, RN Outcome: Progressing Goal: Diagnostic test results will improve 07/09/2018 1703 by Don Perking, RN Outcome: Completed/Met 07/09/2018 0855 by Don Perking, RN Outcome: Progressing Goal: Respiratory complications will improve 07/09/2018 1703 by Don Perking, RN Outcome: Completed/Met 07/09/2018 0855 by Don Perking, RN Outcome: Progressing Goal: Cardiovascular complication will be avoided 07/09/2018 1703 by Don Perking, RN Outcome: Completed/Met 07/09/2018 0855 by Don Perking, RN Outcome: Progressing   Problem: Activity: Goal: Risk for activity intolerance will decrease 07/09/2018 1703 by Don Perking, RN Outcome: Completed/Met 07/09/2018 0855 by Don Perking, RN Outcome: Progressing   Problem: Nutrition: Goal: Adequate nutrition will be maintained 07/09/2018 1703 by Don Perking, RN Outcome: Completed/Met 07/09/2018 0855 by Don Perking, RN Outcome: Progressing   Problem: Coping: Goal: Level  of anxiety will decrease 07/09/2018 1703 by Don Perking, RN Outcome: Completed/Met 07/09/2018 0855 by Don Perking, RN Outcome: Progressing   Problem: Elimination: Goal: Will not experience complications related to bowel motility 07/09/2018 1703 by Don Perking, RN Outcome: Completed/Met 07/09/2018 0855 by Don Perking, RN Outcome: Progressing Goal: Will not experience complications related to urinary retention 07/09/2018 1703 by Don Perking, RN Outcome: Completed/Met 07/09/2018 0855 by Don Perking, RN Outcome: Progressing   Problem: Pain Managment: Goal: General experience of comfort will improve 07/09/2018 1703 by Don Perking, RN Outcome: Completed/Met 07/09/2018 0855 by Don Perking, RN Outcome: Progressing   Problem: Safety: Goal: Ability to remain free from injury will improve 07/09/2018 1703 by Don Perking, RN Outcome: Completed/Met 07/09/2018 0855 by Don Perking, RN Outcome: Progressing   Problem: Skin Integrity: Goal: Risk for impaired skin integrity will decrease 07/09/2018 1703 by Don Perking, RN Outcome: Completed/Met 07/09/2018 0855 by Don Perking, RN Outcome: Progressing  Pt being discharged home

## 2018-07-09 NOTE — Consult Note (Signed)
Cardiology Consultation:   Patient ID: Nicole RhymeDelores D Pittman; [id number]; [birth year]   Admit date: 07/08/2018 Date of Consult: 07/09/2018  Primary Care Provider: Jackie Plumsei-Bonsu, George, MD Primary Cardiologist: [facility], Dr. Rennis GoldenHilty Primary Electrophysiologist:  None   Patient Profile:   Nicole Pittman is a 67 y.o. female with a PMH of HTN, HLD, DM type 2, COPD, tremor, and tobacco abuse, who is being seen today for the evaluation of chest pain and ?Afib at the request of Dr. Selena BattenKim.  History of Present Illness:   Nicole Pittman was in her usual state of health until 3 days ago when she began experiencing a pounding heart beat at nighttime, interfering with her sleep. She stated she would lay down in bed and her heart would start pounding with associated chest tightness and SOB. She would sit in a rocker with some improvement but states her symptoms would persist all night. She denied continued chest discomfort during the day but would experience occasional palpitations. She denied associated diaphoresis, nausea, vomiting, dizziness, lightheadedness, radiation of pain, or syncope . She presented to her PCP office on 07/08/18 with these complaints and reportedly had an EKG in the office which was concerning for atrial fibrillation. She was instructed to present to the ED for further evaluation.   She denies prior cardiac history. Her last ischemic evaluation was a stress test 10 years ago as part of a routine screening which was reportedly negative. She reports family history of CHF in her mother and CAD in her grandmother. She continues to smoke 1/2 ppd.  At the time of this evaluation she is chest pain free with the exception of some chest wall tenderness to touch. Prior to 3 days ago she had never experienced chest discomfort. She denies changes in physical activity in recent days/weaks. She denies recent URI or fevers. She is limited in ambulation by chronic DOE 2/2 COPD but denies exertional  chest pain. She has chronic orthopnea which is unchanged. She denies LE edema or PND.    Hospital course: intermittently tachycardic to the 100s, initially hypertensive but improved at this time, otherwise VSS. Labs notable for electrolytes wnl, Cr 0.97, Hgb 12.0, PLT 290, Trop negative x3, LDL 63, Hgb A1C 7.0, Ddimer negative. CXR with COPD but no acute findings. EKG with sinus rhythm with 1st degree AV block with non-specific ST-T wave abnormalities, overall non-ischemic. Pain improved with IV dilaudid. She was admitted to medicine and home medications were continued. Cardiology asked to evaluate patient for chest pain and ?new onset atrial fibrillation.   Past Medical History:  Diagnosis Date  . Adhesive arachnoiditis   . Arthritis   . Back problem    spurs  . COPD (chronic obstructive pulmonary disease) (HCC)   . Diabetes (HCC)   . High cholesterol   . Hypertension   . Neck problem    bad disc    Past Surgical History:  Procedure Laterality Date  . BACK SURGERY  1978   L5  . CHOLECYSTECTOMY  1992  . KNEE SURGERY Bilateral 1993  . OVARIAN CYST REMOVAL  1985  . TONSILLECTOMY  1959  . WRIST SURGERY Bilateral 2003     Home Medications:  Prior to Admission medications   Medication Sig Start Date End Date Taking? Authorizing Provider  amitriptyline (ELAVIL) 25 MG tablet Take 25 mg by mouth at bedtime.   Yes [provider]  aspirin 81 MG tablet Take 81 mg by mouth daily with supper.    Yes  [provider]  atorvastatin (LIPITOR) 40 MG tablet Take 40 mg by mouth at bedtime.    Yes [provider]  baclofen (LIORESAL) 10 MG tablet Take 10 mg by mouth 2 (two) times daily.  01/01/16  Yes [provider]  Cholecalciferol (VITAMIN D-3 PO) Take 1 capsule by mouth daily.   Yes [provider]  fenofibrate (TRICOR) 48 MG tablet Take 48 mg by mouth at bedtime.    Yes [provider]  glipiZIDE-metformin (METAGLIP) 5-500 MG tablet Take 1  tablet by mouth 2 (two) times daily.  12/25/15  Yes [provider]  levETIRAcetam (KEPPRA) 500 MG tablet Take 500 mg by mouth daily with supper.  12/13/15  Yes [provider]  metoCLOPramide (REGLAN) 10 MG tablet Take 10 mg by mouth 4 (four) times daily.   Yes [provider]  metoprolol (LOPRESSOR) 50 MG tablet Take 25 mg by mouth daily.    Yes [provider]  oxyCODONE (OXY IR/ROXICODONE) 5 MG immediate release tablet Take 5 mg by mouth 2 (two) times daily.  01/02/16  Yes [provider]  OXYCONTIN 20 MG 12 hr tablet Take 20 mg by mouth daily with supper.  12/19/15  Yes [provider]  PROAIR HFA 108 (90 Base) MCG/ACT inhaler Inhale 2 puffs into the lungs 2 (two) times daily.  01/03/16  Yes [provider]  quinapril (ACCUPRIL) 20 MG tablet Take 20 mg by mouth daily. 01/12/16  Yes [provider]  triamterene-hydrochlorothiazide (MAXZIDE-25) 37.5-25 MG tablet Take 1 tablet by mouth daily. 01/06/16  Yes [provider]  zolpidem (AMBIEN) 10 MG tablet Take 10 mg by mouth daily. 12/29/15  Yes [provider]  Alcohol Swabs (ALCOHOL PREP) 70 % PADS 4 (four) times daily. 12/14/15   [provider]  EASY COMFORT LANCETS MISC 4 (four) times daily. for testing 12/14/15   [provider]  FREESTYLE LITE test strip 4 (four) times daily. for testing 12/14/15   [provider]    Inpatient Medications: Scheduled Meds: . albuterol  3 mL Inhalation BID  . amitriptyline  25 mg Oral QHS  . aspirin EC  81 mg Oral Q supper  . atorvastatin  40 mg Oral QHS  . baclofen  10 mg Oral BID  . enoxaparin (LOVENOX) injection  40 mg Subcutaneous Q24H  . fenofibrate  54 mg Oral Daily  . insulin aspart  0-9 Units Subcutaneous Q4H  . levETIRAcetam  500 mg Oral Q supper  . metoCLOPramide  10 mg Oral TID WC & HS  . metoprolol tartrate  25 mg Oral Daily  . nitroGLYCERIN  1 inch Topical Q8H  . oxyCODONE  5 mg Oral  BID  . oxyCODONE  20 mg Oral Q supper  . quinapril  20 mg Oral Daily  . triamterene-hydrochlorothiazide  1 tablet Oral Daily  . zolpidem  5 mg Oral QHS   Continuous Infusions:  PRN Meds: acetaminophen **OR** acetaminophen, HYDROmorphone (DILAUDID) injection, nitroGLYCERIN  Allergies:    Allergies  Allergen Reactions  . Codeine Hives  . Morphine And Related Hives  . Symbicort [Budesonide-Formoterol Fumarate] Other (See Comments)    Made patient's chest "hurt" and affected vision (blurred)  . Zanaflex [Tizanidine] Palpitations and Other (See Comments)    And worsened insomina    Social History:   Social History   Socioeconomic History  . Marital status: Married    Spouse name: Dannielle Huh  . Number of children: 4  . Years of education:  12  . Highest education level: Not on file  Occupational History  . Occupation: Retired  Engineer, productionocial Needs  . Financial resource strain: Not on file  . Food insecurity:    Worry: Not on file    Inability: Not on file  . Transportation needs:    Medical: Not on file    Non-medical: Not on file  Tobacco Use  . Smoking status: Current Every Day Smoker    Packs/day: 0.50    Types: Cigarettes  . Smokeless tobacco: Never Used  Substance and Sexual Activity  . Alcohol use: No    Alcohol/week: 0.0 standard drinks  . Drug use: No  . Sexual activity: Not on file  Lifestyle  . Physical activity:    Days per week: Not on file    Minutes per session: Not on file  . Stress: Not on file  Relationships  . Social connections:    Talks on phone: Not on file    Gets together: Not on file    Attends religious service: Not on file    Active member of club or organization: Not on file    Attends meetings of clubs or organizations: Not on file    Relationship status: Not on file  . Intimate partner violence:    Fear of current or ex partner: Not on file    Emotionally abused: Not on file    Physically abused: Not on file    Forced sexual activity: Not  on file  Other Topics Concern  . Not on file  Social History Narrative   Lives w/ husband and sons   Caffeine use: none    Family History:    Family History  Problem Relation Age of Onset  . Rheum arthritis Mother   . Hypertension Mother   . Parkinson's disease Mother   . Parkinsonism Mother   . Diabetes Father   . Breast cancer Maternal Aunt   . Lupus Maternal Aunt      ROS:  Please see the history of present illness.    All other ROS reviewed and negative.     Physical Exam/Data:   Vitals:   07/09/18 0700 07/09/18 0709 07/09/18 0721 07/09/18 1124  BP:  (!) 123/59  (!) 117/54  Pulse:  84  70  Resp: 13 17  19   Temp:  98.7 F (37.1 C)  98.9 F (37.2 C)  TempSrc:  Oral  Oral  SpO2:  94% 95% 95%  Weight:      Height:        Intake/Output Summary (Last 24 hours) at 07/09/2018 1340 Last data filed at 07/09/2018 0800 Gross per 24 hour  Intake 794.78 ml  Output 500 ml  Net 294.78 ml   Filed Weights   07/08/18 1817 07/09/18 0317  Weight: 57.6 kg 56.7 kg   Body mass index is 22.14 kg/m.  General:  Well nourished, well developed, laying in bed in no acute distress HEENT: sclera anicteric, edentulous  Neck: no JVD Vascular: No carotid bruits; distal pulses 2+ bilaterally Cardiac:  normal S1, S2; RRR; no murmurs, rubs, or gallops; chest wall is tender to palpation Lungs:  clear to auscultation bilaterally, no wheezing, rhonchi or rales  Abd: NABS, soft, nontender, no hepatomegaly Ext: no edema Musculoskeletal:  No deformities, BUE and BLE strength normal and equal Skin: warm and dry  Neuro:  CNs 2-12 intact, no focal abnormalities noted Psych:  Normal affect   EKG:  The EKG was personally reviewed and demonstrates:  sinus rhythm with 1st degree AV block with non-specific ST-T wave abnormalities, overall non-ischemic. Telemetry:  Telemetry was personally reviewed and demonstrates:  Sinus rhythm with occasional to frequent PVCs and an episode of ventricular  bigeminy. No evidence of atrial fibrillation  Relevant CV Studies: Echocardiogram 07/09/18: Study Conclusions  - Left ventricle: The cavity size was normal. There was mild   concentric hypertrophy. Systolic function was normal. The   estimated ejection fraction was in the range of 55% to 60%. Wall motion was normal; there were no regional wall motion   abnormalities. Doppler parameters are consistent with abnormal left ventricular relaxation (grade 1 diastolic dysfunction). Doppler parameters are consistent with elevated ventricular end-diastolic filling pressure. - Aortic valve: Trileaflet; normal thickness leaflets. There was no regurgitation. - Mitral valve: There was trivial regurgitation. - Left atrium: The atrium was normal in size. - Right ventricle: Systolic function was normal. - Right atrium: The atrium was normal in size. - Tricuspid valve: There was mild regurgitation. - Pulmonary arteries: Systolic pressure was at the upper limits of normal. - Inferior vena cava: The vessel was normal in size. - Pericardium, extracardiac: There was no pericardial effusion.   Laboratory Data:  Chemistry Recent Labs  Lab 07/08/18 1829 07/09/18 0019  NA 138 139  K 4.2 4.1  CL 104 103  CO2 21* 23  GLUCOSE 193* 132*  BUN 26* 22  CREATININE 1.03* 0.97  CALCIUM 10.0 9.7  GFRNONAA 55* 59*  GFRAA >60 >60  ANIONGAP 13 13    Recent Labs  Lab 07/09/18 0019  PROT 7.3  ALBUMIN 4.1  AST 21  ALT 26  ALKPHOS 72  BILITOT 0.5   Hematology Recent Labs  Lab 07/08/18 1829 07/09/18 0019  WBC 10.5 10.7*  RBC 4.41 4.11  HGB 12.8 12.0  HCT 41.0 37.0  MCV 93.0 90.0  MCH 29.0 29.2  MCHC 31.2 32.4  RDW 12.9 12.7  PLT 286 290   Cardiac Enzymes Recent Labs  Lab 07/09/18 0019 07/09/18 0625 07/09/18 1141  TROPONINI <0.03 <0.03 <0.03    Recent Labs  Lab 07/08/18 1834 07/08/18 2223  TROPIPOC 0.00 0.00    BNPNo results for input(s): BNP, PROBNP in the last 168 hours.  DDimer    Recent Labs  Lab 07/09/18 0019  DDIMER <0.27    Radiology/Studies:  Dg Chest 2 View  Result Date: 07/08/2018 CLINICAL DATA:  Chest pain and shortness of breath EXAM: CHEST - 2 VIEW COMPARISON:  None. FINDINGS: The lungs are hyperinflated with diffuse interstitial prominence. No focal airspace consolidation or pulmonary edema. No pleural effusion or pneumothorax. Normal cardiomediastinal contours. IMPRESSION: COPD without acute airspace disease. Electronically Signed   By: Deatra Robinson M.D.   On: 07/08/2018 19:47    Assessment and Plan:   1. Atypical chest pain: patient presented with chest pain x3 days which occurs at nighttime and is described as a thumping in her chest with some chest tightness and SOB. She has tenderness to palpation of chest wall. Ddimer and Troponins have been negative. EKG without ischemic changes. CXR without acute findings. Echo with EF 55-60%, G1DD, no wall motion abnormalities. She was noted to have frequent PVCs on telemetry so possible she is experiencing symptomatic PVCs. Risk factors for heart disease include HTN, HLD, DM type 2, and tobacco abuse.  - No need for further ischemic evaluation inpatient - Would be a good candidate for a cardiac CTA outpatient to evaluate her coronary anatomy given risk factors. - Will increased her home  metoprolol to 25mg  BID for possible symptomatic PVCs  2. ?Atrial fibrillation: reportedly EKG at outpatient office 07/08/18 with atrial fibrillation. No prior history and has not been observed on EKGs or telemetry this admission. Echo this admission with EF 55-60% and normal LA.  - Await outpatient EKG from PCP office - TSH pending - Consider event monitor at discharge if no recurrence inpatient and unable to obtain EKG.   3. HTN: BP stable today - Continue current regimen of quinapril and increased metoprolol  4. HLD: LDL well controlled at 63 this admission - Continue statin and fenofibrate  5. Tobacco abuse: continues to  smoke 1/2ppd. Encouraged  - Continue to encourage cessation  6. COPD: no evidence of exacerbation and CXR without acute findings.  - Continue home inhaler     For questions or updates, please contact [facility] Please consult [email or web address] for contact info under Cardiology/STEMI.   Signed, Beatriz Stallion, PA-C  07/09/2018 1:40 PM [phone]

## 2018-07-09 NOTE — Discharge Summary (Addendum)
Physician Discharge Summary  Nicole Pittman MWU:[id number] DOB: 03-31-1951 DOA: 07/08/2018  PCP: Jackie Plum, MD  Admit date: 07/08/2018 Discharge date: 07/09/2018  Admitted From: home Discharge disposition: home   Recommendations for Outpatient Follow-Up:   1. Outpatient cardiology follow up-- event monitor (unable to get EKG from PCP) and consideration of CTA 2. Smoking cessation   Discharge Diagnosis:   Active Problems:   HTN (hypertension)   HLD (hyperlipidemia)   Diabetes (HCC)   Chest pain, precordial   Paroxysmal atrial fibrillation (HCC)    Discharge Condition: Improved.  Diet recommendation: Low sodium, heart healthy.  Carbohydrate-modified.  Wound care: None.  Code status: Full.   History of Present Illness:    Nicole Pittman  is a 67 y.o. female, w hypertension, hyperlipidemia, dm2, presents with c/o chest pain, x 3 days, substernal without radiation.  + slight dyspnea.  Slight dry cough. Pt denies fever, chills, n/v, diarrhea, brbpr, black stool.  Pt seen in office by NP [facility] and told to go to ER for evaluation of chest pain and ? Afib   Hospital Course by Problem:   Atypical chest pain: -outpatient cardiac CTA  -  increased her home metoprolol to 25mg  BID for possible symptomatic PVCs  Reported ?Atrial fibrillation: - reportedly EKG at outpatient office 07/08/18 with atrial fibrillation. No prior history and has not been observed on EKGs or telemetry this admission. Echo this admission with EF 55-60% and normal LA.  - TSH normal -have been unsuccessful getting EKG-- cardiology planning for event monitor  HTN:  -increased metoprolol  HLD:  -LDL: 63 - Continue statin and fenofibrate  DM -resume home meds, outpatient follow up  COPD: .  - Continue home inhaler  -encouraged smoking cessation for continued tobacco abuse    Medical Consultants:   cards   Discharge Exam:   Vitals:   07/09/18 0721 07/09/18 1124    BP:  (!) 117/54  Pulse:  70  Resp:  19  Temp:  98.9 F (37.2 C)  SpO2: 95% 95%   Vitals:   07/09/18 0700 07/09/18 0709 07/09/18 0721 07/09/18 1124  BP:  (!) 123/59  (!) 117/54  Pulse:  84  70  Resp: 13 17  19   Temp:  98.7 F (37.1 C)  98.9 F (37.2 C)  TempSrc:  Oral  Oral  SpO2:  94% 95% 95%  Weight:      Height:        General exam: Appears calm and comfortable.     The results of significant diagnostics from this hospitalization (including imaging, microbiology, ancillary and laboratory) are listed below for reference.     Procedures and Diagnostic Studies:   Dg Chest 2 View  Result Date: 07/08/2018 CLINICAL DATA:  Chest pain and shortness of breath EXAM: CHEST - 2 VIEW COMPARISON:  None. FINDINGS: The lungs are hyperinflated with diffuse interstitial prominence. No focal airspace consolidation or pulmonary edema. No pleural effusion or pneumothorax. Normal cardiomediastinal contours. IMPRESSION: COPD without acute airspace disease. Electronically Signed   By: Deatra Robinson M.D.   On: 07/08/2018 19:47     Labs:   Basic Metabolic Panel: Recent Labs  Lab 07/08/18 1829 07/08/18 2200 07/09/18 0019  NA 138  --  139  K 4.2  --  4.1  CL 104  --  103  CO2 21*  --  23  GLUCOSE 193*  --  132*  BUN 26*  --  22  CREATININE 1.03*  --  0.97  CALCIUM 10.0  --  9.7  MG  --  2.1  --    GFR Estimated Creatinine Clearance: 45.9 mL/min (by C-G formula based on SCr of 0.97 mg/dL). Liver Function Tests: Recent Labs  Lab 07/09/18 0019  AST 21  ALT 26  ALKPHOS 72  BILITOT 0.5  PROT 7.3  ALBUMIN 4.1   No results for input(s): LIPASE, AMYLASE in the last 168 hours. No results for input(s): AMMONIA in the last 168 hours. Coagulation profile No results for input(s): INR, PROTIME in the last 168 hours.  CBC: Recent Labs  Lab 07/08/18 1829 07/09/18 0019  WBC 10.5 10.7*  HGB 12.8 12.0  HCT 41.0 37.0  MCV 93.0 90.0  PLT 286 290   Cardiac Enzymes: Recent Labs   Lab 07/09/18 0019 07/09/18 0625 07/09/18 1141  TROPONINI <0.03 <0.03 <0.03   BNP: Invalid input(s): POCBNP CBG: Recent Labs  Lab 07/08/18 2232 07/09/18 0446 07/09/18 0800 07/09/18 1123 07/09/18 1642  GLUCAP 148* 125* 82 124* 232*   D-Dimer Recent Labs    07/09/18 0019  DDIMER <0.27   Hgb A1c Recent Labs    07/09/18 0019  HGBA1C 7.0*   Lipid Profile Recent Labs    07/09/18 0019  CHOL 141  HDL 48  LDLCALC 63  TRIG 150*  CHOLHDL 2.9   Thyroid function studies Recent Labs    07/09/18 1423  TSH 1.280   Anemia work up No results for input(s): VITAMINB12, FOLATE, FERRITIN, TIBC, IRON, RETICCTPCT in the last 72 hours. Microbiology Recent Results (from the past 240 hour(s))  MRSA PCR Screening     Status: None   Collection Time: 07/08/18 11:32 PM  Result Value Ref Range Status   MRSA by PCR NEGATIVE NEGATIVE Final    Comment:        The GeneXpert MRSA Assay (FDA approved for NASAL specimens only), is one component of a comprehensive MRSA colonization surveillance program. It is not intended to diagnose MRSA infection nor to guide or monitor treatment for MRSA infections. Performed at [facility], [address] [place], [place] [address]      Discharge Instructions:   Discharge Instructions    Diet - low sodium heart healthy   Complete by:  As directed    Diet Carb Modified   Complete by:  As directed    Discharge instructions   Complete by:  As directed    outpatient event monitor and will consider outpatient cardiac CTA to rule out coronary ischemia   Discharge instructions   Complete by:  As directed    Can be d/c'd at 5:30 if no EKG sent from PCP office   Increase activity slowly   Complete by:  As directed      Allergies as of 07/09/2018      Reactions   Codeine Hives   Morphine And Related Hives   Symbicort [budesonide-formoterol Fumarate] Other (See Comments)   Made patient's chest "hurt" and affected vision (blurred)    Zanaflex [tizanidine] Palpitations, Other (See Comments)   And worsened insomina      Medication List    TAKE these medications   Alcohol Prep 70 % Pads 4 (four) times daily.   amitriptyline 25 MG tablet Commonly known as:  ELAVIL Take 25 mg by mouth at bedtime.   aspirin 81 MG tablet Take 81 mg by mouth daily with supper.   atorvastatin 40 MG tablet Commonly known as:  LIPITOR Take 40 mg by mouth at  bedtime.   baclofen 10 MG tablet Commonly known as:  LIORESAL Take 10 mg by mouth 2 (two) times daily.   EASY COMFORT LANCETS Misc 4 (four) times daily. for testing   fenofibrate 48 MG tablet Commonly known as:  TRICOR Take 48 mg by mouth at bedtime.   FREESTYLE LITE test strip Generic drug:  glucose blood 4 (four) times daily. for testing   glipiZIDE-metformin 5-500 MG tablet Commonly known as:  METAGLIP Take 1 tablet by mouth 2 (two) times daily.   levETIRAcetam 500 MG tablet Commonly known as:  KEPPRA Take 500 mg by mouth daily with supper.   metoCLOPramide 10 MG tablet Commonly known as:  REGLAN Take 10 mg by mouth 4 (four) times daily.   metoprolol tartrate 25 MG tablet Commonly known as:  LOPRESSOR Take 1 tablet (25 mg total) by mouth 2 (two) times daily. What changed:    medication strength  when to take this   OXYCONTIN 20 mg 12 hr tablet Generic drug:  oxyCODONE Take 20 mg by mouth daily with supper.   oxyCODONE 5 MG immediate release tablet Commonly known as:  Oxy IR/ROXICODONE Take 5 mg by mouth 2 (two) times daily.   PROAIR HFA 108 (90 Base) MCG/ACT inhaler Generic drug:  albuterol Inhale 2 puffs into the lungs 2 (two) times daily.   quinapril 20 MG tablet Commonly known as:  ACCUPRIL Take 20 mg by mouth daily.   triamterene-hydrochlorothiazide 37.5-25 MG tablet Commonly known as:  MAXZIDE-25 Take 1 tablet by mouth daily.   VITAMIN D-3 PO Take 1 capsule by mouth daily.   zolpidem 10 MG tablet Commonly known as:  AMBIEN Take  10 mg by mouth daily.      Follow-up Information    Osei-Bonsu, Greggory StallionGeorge, MD Follow up in 1 week(s).   Specialty:  Internal Medicine Contact information: [address] [place] [address] [phone]        Chrystie NoseHilty, Kenneth C, MD Follow up.   Specialty:  Cardiology Contact information: [address] [place] [address] [phone]            Time coordinating discharge: 25 min  Signed:  Joseph ArtJessica U Adam Sanjuan  [facility] 07/09/2018, 4:46 PM

## 2018-07-09 NOTE — Progress Notes (Signed)
  Echocardiogram 2D Echocardiogram has been performed.  Nicole Pittman, Nicole Pittman M 07/09/2018, 11:28 AM

## 2018-07-09 NOTE — Plan of Care (Signed)

## 2018-07-22 DIAGNOSIS — J449 Chronic obstructive pulmonary disease, unspecified: Secondary | ICD-10-CM | POA: Diagnosis not present

## 2018-07-22 DIAGNOSIS — I10 Essential (primary) hypertension: Secondary | ICD-10-CM | POA: Diagnosis not present

## 2018-07-22 DIAGNOSIS — K3184 Gastroparesis: Secondary | ICD-10-CM | POA: Diagnosis not present

## 2018-07-22 DIAGNOSIS — R569 Unspecified convulsions: Secondary | ICD-10-CM | POA: Diagnosis not present

## 2018-07-22 DIAGNOSIS — E119 Type 2 diabetes mellitus without complications: Secondary | ICD-10-CM | POA: Diagnosis not present

## 2018-07-22 DIAGNOSIS — Z87891 Personal history of nicotine dependence: Secondary | ICD-10-CM | POA: Diagnosis not present

## 2018-07-22 DIAGNOSIS — R51 Headache: Secondary | ICD-10-CM | POA: Diagnosis not present

## 2018-07-22 DIAGNOSIS — G250 Essential tremor: Secondary | ICD-10-CM | POA: Diagnosis not present

## 2018-07-22 DIAGNOSIS — I4891 Unspecified atrial fibrillation: Secondary | ICD-10-CM | POA: Diagnosis not present

## 2018-07-22 DIAGNOSIS — E785 Hyperlipidemia, unspecified: Secondary | ICD-10-CM | POA: Diagnosis not present

## 2018-07-29 DIAGNOSIS — Z716 Tobacco abuse counseling: Secondary | ICD-10-CM | POA: Diagnosis not present

## 2018-07-29 DIAGNOSIS — F17200 Nicotine dependence, unspecified, uncomplicated: Secondary | ICD-10-CM | POA: Diagnosis not present

## 2018-07-29 DIAGNOSIS — Z720 Tobacco use: Secondary | ICD-10-CM | POA: Diagnosis not present

## 2018-07-29 DIAGNOSIS — G894 Chronic pain syndrome: Secondary | ICD-10-CM | POA: Diagnosis not present

## 2018-07-29 DIAGNOSIS — Z5181 Encounter for therapeutic drug level monitoring: Secondary | ICD-10-CM | POA: Diagnosis not present

## 2018-07-29 DIAGNOSIS — M961 Postlaminectomy syndrome, not elsewhere classified: Secondary | ICD-10-CM | POA: Diagnosis not present

## 2018-07-29 DIAGNOSIS — Z87891 Personal history of nicotine dependence: Secondary | ICD-10-CM | POA: Diagnosis not present

## 2018-07-29 DIAGNOSIS — M545 Low back pain: Secondary | ICD-10-CM | POA: Diagnosis not present

## 2018-08-03 NOTE — Progress Notes (Signed)
Cardiology Office Note   Date:  08/04/2018   ID:  Nicole Pittman, DOB 1951-02-28, MRN [id number]  PCP:  Jackie Plum, MD  Cardiologist: Dr.  Rennis Golden  Chief Complaint  Patient presents with  . Hospitalization Follow-up     History of Present Illness: Nicole Pittman is a 67 y.o. female who presents for post hospital follow up after being admitted for chest pain and mild dyspnea. She was seen by PCP and was advised to go to ER in the setting of atrial fib. However EKG on arrival revealed NSR. Metoprolol was increased to 25 mg BID. Echocardiogram was ordered revealing normal EF of 55%-60% and normal sized LA. Other history includes HTN, COPD, diabetes, and ongoing tobacco abuse. There was inability to obtain prior EKG to confirm atrial fib, and she remained in NSR throughout hospitalization.  She comes today for recurrent chest pain. She has also had periods of rapid irregular HR, usually associated with being startled. Chest pain occurs with exertion. She unfortunately continues to smoke, and lives with her husband who smokes heavily.   Past Medical History:  Diagnosis Date  . Adhesive arachnoiditis   . Arthritis   . Back problem    spurs  . COPD (chronic obstructive pulmonary disease) (HCC)   . Diabetes (HCC)   . High cholesterol   . Hypertension   . Neck problem    bad disc    Past Surgical History:  Procedure Laterality Date  . BACK SURGERY  1978   L5  . CHOLECYSTECTOMY  1992  . KNEE SURGERY Bilateral 1993  . OVARIAN CYST REMOVAL  1985  . TONSILLECTOMY  1959  . WRIST SURGERY Bilateral 2003     Current Outpatient Medications  Medication Sig Dispense Refill  . Alcohol Swabs (ALCOHOL PREP) 70 % PADS 4 (four) times daily.  99  . amitriptyline (ELAVIL) 25 MG tablet Take 25 mg by mouth at bedtime.    Marland Kitchen aspirin 81 MG tablet Take 81 mg by mouth daily with supper.     Marland Kitchen atorvastatin (LIPITOR) 40 MG tablet Take 40 mg by mouth at bedtime.     . baclofen (LIORESAL) 10 MG  tablet Take 10 mg by mouth 2 (two) times daily.     . Cholecalciferol (VITAMIN D-3 PO) Take 1 capsule by mouth daily.    Marland Kitchen EASY COMFORT LANCETS MISC 4 (four) times daily. for testing  99  . fenofibrate (TRICOR) 48 MG tablet Take 48 mg by mouth at bedtime.     Marland Kitchen FREESTYLE LITE test strip 4 (four) times daily. for testing  99  . glipiZIDE-metformin (METAGLIP) 5-500 MG tablet Take 1 tablet by mouth 2 (two) times daily.   0  . levETIRAcetam (KEPPRA) 500 MG tablet Take 500 mg by mouth daily.    . metoCLOPramide (REGLAN) 10 MG tablet Take 10 mg by mouth 4 (four) times daily.    . metoprolol tartrate (LOPRESSOR) 25 MG tablet Take 1 tablet (25 mg total) by mouth 2 (two) times daily. 60 tablet 11  . oxyCODONE (OXY IR/ROXICODONE) 5 MG immediate release tablet Take 5 mg by mouth 2 (two) times daily.     . OXYCONTIN 20 MG 12 hr tablet Take 20 mg by mouth daily with supper.     Marland Kitchen PROAIR HFA 108 (90 Base) MCG/ACT inhaler Inhale 2 puffs into the lungs 2 (two) times daily.   5  . quinapril (ACCUPRIL) 20 MG tablet Take 20 mg by mouth daily.  1  .  triamterene-hydrochlorothiazide (MAXZIDE-25) 37.5-25 MG tablet Take 1 tablet by mouth daily.  1  . zolpidem (AMBIEN) 10 MG tablet Take 10 mg by mouth daily.    . nitroGLYCERIN (NITROSTAT) 0.4 MG SL tablet Place 1 tablet (0.4 mg total) under the tongue every 5 (five) minutes as needed for chest pain. MAX 3 doses in 15 minutes 25 tablet 3   No current facility-administered medications for this visit.     Allergies:   Fentanyl; Codeine; Morphine and related; Symbicort [budesonide-formoterol fumarate]; and Zanaflex [tizanidine]    Social History:  The patient  reports that she has been smoking cigarettes. She has been smoking about 0.50 packs per day. She has never used smokeless tobacco. She reports that she does not drink alcohol or use drugs.   Family History:  The patient's family history includes Breast cancer in her maternal aunt; Diabetes in her father;  Hypertension in her mother; Lupus in her maternal aunt; Parkinson's disease in her mother; Parkinsonism in her mother; Rheum arthritis in her mother.    ROS: All other systems are reviewed and negative. Unless otherwise mentioned in H&P    PHYSICAL EXAM: VS:  BP 112/60   Pulse 80  , BMI There is no height or weight on file to calculate BMI. GEN: Well nourished, well developed, in no acute distress  HEENT: normal  Neck: no JVD,bilateal  carotid bruits high pitched, L>R, no  masses Cardiac: RRR; no murmurs, rubs, or gallops,no edema  Respiratory:  clear to auscultation bilaterally, normal work of breathing GI: soft, nontender, nondistended, + BS MS: no deformity or atrophy  Skin: warm and dry, no rash Neuro:  Strength and sensation are intact,mild tremors.  Psych: euthymic mood, full affect   EKG:  NSR rate of 80 bpm.   Recent Labs: 07/08/2018: Magnesium 2.1 2018-07-19: ALT 26; BUN 22; Creatinine, Ser 0.97; Hemoglobin 12.0; Platelets 290; Potassium 4.1; Sodium 139; TSH 1.280    Lipid Panel    Component Value Date/Time   CHOL 141 2018-07-19 0019   TRIG 150 (H) 2018/07/19 0019   HDL 48 07-19-18 0019   CHOLHDL 2.9 19-Jul-2018 0019   VLDL 30 07-19-18 0019   LDLCALC 63 07-19-18 0019      Wt Readings from Last 3 Encounters:  19-Jul-2018 125 lb (56.7 kg)  01/16/16 130 lb 12.8 oz (59.3 kg)      Other studies Reviewed: Echocardiogram 2018/07/19 Study Conclusions  - Left ventricle: The cavity size was normal. There was mild   concentric hypertrophy. Systolic function was normal. The   estimated ejection fraction was in the range of 55% to 60%. Wall   motion was normal; there were no regional wall motion   abnormalities. Doppler parameters are consistent with abnormal   left ventricular relaxation (grade 1 diastolic dysfunction).   Doppler parameters are consistent with elevated ventricular   end-diastolic filling pressure. - Aortic valve: Trileaflet; normal thickness  leaflets. There was no   regurgitation. - Mitral valve: There was trivial regurgitation. - Left atrium: The atrium was normal in size. - Right ventricle: Systolic function was normal. - Right atrium: The atrium was normal in size. - Tricuspid valve: There was mild regurgitation. - Pulmonary arteries: Systolic pressure was at the upper limits of   normal. - Inferior vena cava: The vessel was normal in size. - Pericardium, extracardiac: There was no pericardial effusion.   ASSESSMENT AND PLAN:  1. Chest pain: Occurring with exertion. Multiple CVRF (HTN, DM, FH, HL, tobacco abuse). She is unable  to walk on treadmill due to back injury. Would not have her undergo Lexiscan due to COPD. Will plan cardiac CTA for diagnostic/rpognostic purposes. This has been discussed with her and she is willing to proceed. She is provided a Rx for NTG prn and is advised to go to ER for persistent chest pain requiring NTG.   2. Atrial fib: We have yet to have documentation of this rhythm. She is going to send [place] a copy of the EKG that was completed in her PCP office for [place] to review. She will have Zio cardiac monitor placed to capture possible PAF. Will time the Zio with the CTA to avoid overlap and avoid need to remove monitor for CTA.  3. Ongoing tobacco abuse: Smoking cessation is recommended. I have spent >3 minutes discussing this with her and her husband. She is willing to consider quitting.   4. COPD: Is using inhalers for assistance. Not followed by pulmonology. Consider referral if persistent issues with breathing status.   5. Diabetes: Followed by PCP. Does not check BG at home.   6. Hypercholesterolemia; Currently on atorvastatin. She will continue this. Will need follow up fasting labs.    Current medicines are reviewed at length with the patient today.    Labs/ tests ordered today include: Cardiac CTA. Zio cardiac monitor.   Bettey Mare. Liborio Nixon, ANP, AACC   08/04/2018 4:29 PM    [facility] [address], [place], [place] [address] Phone: [phone]; Fax: [phone]

## 2018-08-04 ENCOUNTER — Ambulatory Visit: Payer: PPO | Admitting: Adult Health

## 2018-08-04 ENCOUNTER — Encounter: Payer: Self-pay | Admitting: Adult Health

## 2018-08-04 VITALS — BP 112/60 | HR 80

## 2018-08-04 DIAGNOSIS — I480 Paroxysmal atrial fibrillation: Secondary | ICD-10-CM

## 2018-08-04 DIAGNOSIS — Z01812 Encounter for preprocedural laboratory examination: Secondary | ICD-10-CM

## 2018-08-04 DIAGNOSIS — I10 Essential (primary) hypertension: Secondary | ICD-10-CM | POA: Diagnosis not present

## 2018-08-04 DIAGNOSIS — E785 Hyperlipidemia, unspecified: Secondary | ICD-10-CM | POA: Diagnosis not present

## 2018-08-04 DIAGNOSIS — R079 Chest pain, unspecified: Secondary | ICD-10-CM

## 2018-08-04 MED ORDER — METOPROLOL TARTRATE 25 MG PO TABS: 25.0000 mg | ORAL_TABLET | Freq: Two times a day (BID) | ORAL | 11 refills | Status: DC

## 2018-08-04 MED ORDER — NITROGLYCERIN 0.4 MG SL SUBL: 0.4000 mg | SUBLINGUAL_TABLET | SUBLINGUAL | 3 refills | Status: DC | PRN

## 2018-08-04 NOTE — Patient Instructions (Addendum)
Medication Instructions:   Samara Deist NP has prescribed nitroglycerin (NTG) to use as needed for chest pain. This pill is dissolved under the tongue and can be taken every 5 minutes as needed up to 3 doses.   Testing/Procedures:  Samara Deist NP has recommended that you wear an event monitor. Event monitors are medical devices that record the heart's electrical activity. Doctors most often [place] these monitors to diagnose arrhythmias. Arrhythmias are problems with the speed or rhythm of the heartbeat. The monitor is a small, portable device. You can wear one while you do your normal daily activities. This is usually used to diagnose what is causing palpitations/syncope (passing out). -- 14 day Zio -- appointment will be at [address] [employer] NP has ordered a coronary CT with FFR (instructions below)  Follow-Up:  It is recommended that you schedule a follow-up appointment in about 1 month (after testing completed) with either Samara Deist NP or Dr. Rennis Golden.   If you need a refill on your cardiac medications before your next appointment, please call your pharmacy.  Any Other Special Instructions Will Be Listed Below (If Applicable).   Coronary CT with FFR instructions Please arrive at the [facility] main entrance of [facility] at ______ AM/PM (30-45 minutes prior to test start time)  [facility] [address] [place], [place] [address] [phone]  Proceed to the [facility] Radiology Department (First Floor).  Please follow these instructions carefully (unless otherwise directed):  One week prior to CT test - please come to [address] (2nd floor) for blood work.  On the Night Before the Test: . Drink plenty of water. . Do not consume any caffeinated/decaffeinated beverages or chocolate 12 hours prior to your test. . Do not take any antihistamines 12 hours prior to your test. . If you take metformin or a combination medication  containing metformin do not take 24 hours prior to test.  On the Day of the Test: . Drink plenty of water. Do not drink any water within one hour of the test. . Do not eat any food 4 hours prior to the test. . You may take your regular medications prior to the test.  After the Test: . Drink plenty of water. . After receiving IV contrast, you may experience a mild flushed feeling. This is normal. . On occasion, you may experience a mild rash up to 24 hours after the test. This is not dangerous. If this occurs, you can take Benadryl 25 mg and increase your fluid intake. . If you experience trouble breathing, this can be serious. If it is severe call 911 IMMEDIATELY. If it is mild, please call our office. . If you take any of these medications: Glipizide/Metformin, Avandament, Glucavance, please do not take 48 hours after completing test.

## 2018-08-07 ENCOUNTER — Telehealth: Payer: Self-pay | Admitting: Adult Health

## 2018-08-07 NOTE — Telephone Encounter (Signed)
Patient of Samara DeistKathryn NP/Dr. Rennis GoldenHilty dropped off EKG from PCP on 08/05/18. This has been placed in medical records scan bin to scan to Samara DeistKathryn NP's in-basket for review

## 2018-08-11 ENCOUNTER — Ambulatory Visit (INDEPENDENT_AMBULATORY_CARE_PROVIDER_SITE_OTHER): Payer: PPO

## 2018-08-11 DIAGNOSIS — I480 Paroxysmal atrial fibrillation: Secondary | ICD-10-CM | POA: Diagnosis not present

## 2018-08-19 NOTE — Progress Notes (Signed)
Not needed

## 2018-08-20 ENCOUNTER — Telehealth: Payer: Self-pay | Admitting: Internal Medicine

## 2018-08-20 NOTE — Telephone Encounter (Signed)
Per referral message from scheduler, patient needs to be called w/coronary CT instructions & advised when to come in for labs. This was listed on AVS from last visit with NP.   Of note, it was recommended that patient f/up with MD AFTER her coronary CT test is completed and it is prior at this point. Her MD f/up will need to be rescheduled

## 2018-08-21 NOTE — Telephone Encounter (Signed)
Spoke with patient about coronary CT. She has no questions about instructions and is aware to have BMET done about 1 week prior.   She expressed concerns about holding her diabetic med for the duration of time specified. Advised that she monitor blood sugar closely and notifying prescribing provider of any concerns.   Moved patient's MD f/up until AFTER coronary CT study (10/21) - 09/17/18 @ 2:45pm

## 2018-08-28 DIAGNOSIS — I480 Paroxysmal atrial fibrillation: Secondary | ICD-10-CM | POA: Diagnosis not present

## 2018-09-03 ENCOUNTER — Ambulatory Visit: Payer: PPO | Admitting: Internal Medicine

## 2018-09-07 DIAGNOSIS — Z01812 Encounter for preprocedural laboratory examination: Secondary | ICD-10-CM | POA: Diagnosis not present

## 2018-09-08 LAB — BASIC METABOLIC PANEL
BUN/Creatinine Ratio: 27 (ref 12–28)
BUN: 27 mg/dL (ref 8–27)
CO2: 19 mmol/L — AB (ref 20–29)
Calcium: 9.4 mg/dL (ref 8.7–10.3)
Chloride: 102 mmol/L (ref 96–106)
Creatinine, Ser: 1 mg/dL (ref 0.57–1.00)
GFR calc Af Amer: 67 mL/min/{1.73_m2} (ref >59)
GFR, EST NON AFRICAN AMERICAN: 58 mL/min/{1.73_m2} — AB (ref >59)
Glucose: 150 mg/dL — ABNORMAL HIGH (ref 65–99)
Potassium: 4.1 mmol/L (ref 3.5–5.2)
SODIUM: 141 mmol/L (ref 134–144)

## 2018-09-08 NOTE — Progress Notes (Signed)
Notes recorded by Jodelle Gross, NP on 09/08/2018 at 8:06 AM EDT Labs are reviewed. No changes in regimen for now. They are essentially normal.

## 2018-09-14 ENCOUNTER — Ambulatory Visit (HOSPITAL_COMMUNITY)
Admission: RE | Admit: 2018-09-14 | Discharge: 2018-09-14 | Disposition: A | Discharge status: Home / Self Care | Payer: PPO | Source: Ambulatory Visit | Attending: Adult Health | Admitting: Adult Health

## 2018-09-14 DIAGNOSIS — E785 Hyperlipidemia, unspecified: Secondary | ICD-10-CM | POA: Diagnosis not present

## 2018-09-14 DIAGNOSIS — R079 Chest pain, unspecified: Secondary | ICD-10-CM | POA: Insufficient documentation

## 2018-09-14 DIAGNOSIS — I10 Essential (primary) hypertension: Secondary | ICD-10-CM | POA: Insufficient documentation

## 2018-09-14 DIAGNOSIS — I2510 Atherosclerotic heart disease of native coronary artery without angina pectoris: Secondary | ICD-10-CM | POA: Insufficient documentation

## 2018-09-14 MED ORDER — METOPROLOL TARTRATE 5 MG/5ML IV SOLN
10.0000 mg | INTRAVENOUS | Status: AC | PRN
  Administered 2018-09-14 (×2): 10 mg via INTRAVENOUS
  Filled 2018-09-14 (×3): qty 10

## 2018-09-14 MED ORDER — METOPROLOL TARTRATE 5 MG/5ML IV SOLN
INTRAVENOUS | Status: AC
  Filled 2018-09-14: qty 20

## 2018-09-14 MED ORDER — METOPROLOL TARTRATE 5 MG/5ML IV SOLN
INTRAVENOUS | Status: AC
  Filled 2018-09-14: qty 5

## 2018-09-14 MED ORDER — IOPAMIDOL (ISOVUE-370) INJECTION 76%
100.0000 mL | Freq: Once | INTRAVENOUS | Status: AC | PRN
  Administered 2018-09-14: 80 mL via INTRAVENOUS

## 2018-09-14 MED ORDER — DILTIAZEM HCL 25 MG/5ML IV SOLN
INTRAVENOUS | Status: AC
  Filled 2018-09-14: qty 5

## 2018-09-14 MED ORDER — DILTIAZEM HCL 25 MG/5ML IV SOLN
5.0000 mg | Freq: Once | INTRAVENOUS | Status: AC
  Administered 2018-09-14: 5 mg via INTRAVENOUS
  Filled 2018-09-14: qty 5

## 2018-09-14 MED ORDER — METOPROLOL TARTRATE 5 MG/5ML IV SOLN
5.0000 mg | INTRAVENOUS | Status: AC | PRN
  Administered 2018-09-14: 5 mg via INTRAVENOUS
  Filled 2018-09-14: qty 5

## 2018-09-14 MED ORDER — NITROGLYCERIN 0.4 MG SL SUBL
SUBLINGUAL_TABLET | SUBLINGUAL | Status: AC
  Filled 2018-09-14: qty 2

## 2018-09-14 MED ORDER — NITROGLYCERIN 0.4 MG SL SUBL
0.8000 mg | SUBLINGUAL_TABLET | Freq: Once | SUBLINGUAL | Status: AC
  Administered 2018-09-14: 0.8 mg via SUBLINGUAL
  Filled 2018-09-14: qty 25

## 2018-09-15 NOTE — Research (Signed)
CT CADFEM Informed Consent   Subject Name: Nicole Pittman  Subject met inclusion and exclusion criteria.  The informed consent form, study requirements and expectations were reviewed with the subject and questions and concerns were addressed prior to the signing of the consent form.  The subject verbalized understanding of the trail requirements.  The subject agreed to participate in the CT CADFEM trial and signed the informed consent.  The informed consent was obtained prior to performance of any protocol-specific procedures for the subject.  A copy of the signed informed consent was given to the subject and a copy was placed in the subject's medical record.  Neva Seat 09/14/2018 12:37 PM

## 2018-09-17 ENCOUNTER — Encounter: Payer: Self-pay | Admitting: Internal Medicine

## 2018-09-17 ENCOUNTER — Ambulatory Visit (INDEPENDENT_AMBULATORY_CARE_PROVIDER_SITE_OTHER): Payer: PPO | Admitting: Internal Medicine

## 2018-09-17 VITALS — BP 110/52 | HR 83 | Ht 63.0 in | Wt 125.8 lb

## 2018-09-17 DIAGNOSIS — R079 Chest pain, unspecified: Secondary | ICD-10-CM

## 2018-09-17 DIAGNOSIS — R1310 Dysphagia, unspecified: Secondary | ICD-10-CM

## 2018-09-17 NOTE — Patient Instructions (Signed)
Medication Instructions:  Continue current medications If you need a refill on your cardiac medications before your next appointment, please call your pharmacy.   Follow-Up: At [facility], you and your health needs are our priority.  As part of our continuing mission to provide you with exceptional heart care, we have created designated Provider Care Teams.  These Care Teams include your primary Cardiologist (physician) and Advanced Practice Providers (APPs -  Physician Assistants and Nurse Practitioners) who all work together to provide you with the care you need, when you need it. You will need a follow up appointment in 6 months.  Please call our office 2 months in advance to schedule this appointment.  You may see Dr. Rennis Golden or one of the following Advanced Practice Providers on your designated Care Team: Azalee Course, [place] . Micah Flesher, PA-C  Any Other Special Instructions Will Be Listed Below (If Applicable).  You have been referred to Dr. Jeani Hawking (gastroenterologist)

## 2018-09-18 ENCOUNTER — Encounter: Payer: Self-pay | Admitting: Internal Medicine

## 2018-09-18 NOTE — Progress Notes (Signed)
OFFICE NOTE  Chief Complaint:  Follow-up  Primary Care Physician: Jackie Plum, MD  HPI:  Nicole Pittman is a 67 y.o. female with a past medial history significant for recent hospitalization for chest pain and mild dyspnea.  There was concern for A. fib and she was referred to the hospital by her PCP for this however no EKG or evidence of A. fib was found.  Her metoprolol was increased to 25 mg twice daily.  An echocardiogram showed normal EF with normal size atria.  Subsequently she was discharged and reports that she has had periods of rapid irregular heartbeat.  She was placed on a monitor by Joni Reining, DNP, who saw her in follow-up.  This showed predominantly sinus rhythm with infrequent PVCs occurring in bigeminal and trigeminal patterns.  I personally reviewed the study.  There was no evidence for atrial fibrillation.  She also underwent coronary artery CT scanning which showed a calcium score of 12 and minimal nonobstructive coronary disease.  I shared those findings with her today and she reports her chest pain has largely resolved.  She still feels some palpitations.  She is also been having issues with swallowing.  She says at times she feels that food may get stuck in her chest and she has had some relief in her chest discomfort with nitroglycerin suggesting perhaps esophageal stricture.  She is on medications for gastric motility and was previously seen by a gastroenterologist for chronic problems in [place], [place].  He has not established with a GI doctor here.  PMHx:  Past Medical History:  Diagnosis Date  . Adhesive arachnoiditis   . Arthritis   . Back problem    spurs  . COPD (chronic obstructive pulmonary disease) (HCC)   . Diabetes (HCC)   . High cholesterol   . Hypertension   . Neck problem    bad disc    Past Surgical History:  Procedure Laterality Date  . BACK SURGERY  1978   L5  . CHOLECYSTECTOMY  1992  . KNEE SURGERY Bilateral 1993    . OVARIAN CYST REMOVAL  1985  . TONSILLECTOMY  1959  . WRIST SURGERY Bilateral 2003    FAMHx:  Family History  Problem Relation Age of Onset  . Rheum arthritis Mother   . Hypertension Mother   . Parkinson's disease Mother   . Parkinsonism Mother   . Diabetes Father   . Breast cancer Maternal Aunt   . Lupus Maternal Aunt     SOCHx:   reports that she has been smoking cigarettes. She has been smoking about 0.50 packs per day. She has never used smokeless tobacco. She reports that she does not drink alcohol or use drugs.  ALLERGIES:  Allergies  Allergen Reactions  . Fentanyl Itching and Rash    PATCHES  . Codeine Hives  . Morphine And Related Hives  . Symbicort [Budesonide-Formoterol Fumarate] Other (See Comments)    Made patient's chest "hurt" and affected vision (blurred)  . Zanaflex [Tizanidine] Palpitations and Other (See Comments)    And worsened insomina    ROS: Pertinent items noted in HPI and remainder of comprehensive ROS otherwise negative.  HOME MEDS: Current Outpatient Medications on File Prior to Visit  Medication Sig Dispense Refill  . Alcohol Swabs (ALCOHOL PREP) 70 % PADS 4 (four) times daily.  99  . amitriptyline (ELAVIL) 25 MG tablet Take 25 mg by mouth at bedtime.    Marland Kitchen aspirin 81 MG tablet Take 81 mg by  mouth daily with supper.     Marland Kitchen atorvastatin (LIPITOR) 40 MG tablet Take 40 mg by mouth at bedtime.     . baclofen (LIORESAL) 10 MG tablet Take 10 mg by mouth 2 (two) times daily.     . Cholecalciferol (VITAMIN D-3 PO) Take 1 capsule by mouth daily.    Marland Kitchen EASY COMFORT LANCETS MISC 4 (four) times daily. for testing  99  . fenofibrate (TRICOR) 48 MG tablet Take 48 mg by mouth at bedtime.     Marland Kitchen FREESTYLE LITE test strip 4 (four) times daily. for testing  99  . glipiZIDE-metformin (METAGLIP) 5-500 MG tablet Take 1 tablet by mouth 2 (two) times daily.   0  . levETIRAcetam (KEPPRA) 500 MG tablet Take 500 mg by mouth daily.    . metoCLOPramide (REGLAN) 10 MG  tablet Take 10 mg by mouth 4 (four) times daily.    . metoprolol tartrate (LOPRESSOR) 25 MG tablet Take 1 tablet (25 mg total) by mouth 2 (two) times daily. 60 tablet 11  . nitroGLYCERIN (NITROSTAT) 0.4 MG SL tablet Place 1 tablet (0.4 mg total) under the tongue every 5 (five) minutes as needed for chest pain. MAX 3 doses in 15 minutes 25 tablet 3  . oxyCODONE (OXY IR/ROXICODONE) 5 MG immediate release tablet Take 5 mg by mouth 2 (two) times daily.     . OXYCONTIN 20 MG 12 hr tablet Take 20 mg by mouth daily with supper.     Marland Kitchen PROAIR HFA 108 (90 Base) MCG/ACT inhaler Inhale 2 puffs into the lungs 2 (two) times daily.   5  . quinapril (ACCUPRIL) 20 MG tablet Take 20 mg by mouth daily.  1  . triamterene-hydrochlorothiazide (MAXZIDE-25) 37.5-25 MG tablet Take 1 tablet by mouth daily.  1  . zolpidem (AMBIEN) 10 MG tablet Take 10 mg by mouth daily.     No current facility-administered medications on file prior to visit.     LABS/IMAGING: No results found for this or any previous visit (from the past 48 hour(s)). No results found.  LIPID PANEL:    Component Value Date/Time   CHOL 141 07/09/2018 0019   TRIG 150 (H) 07/09/2018 0019   HDL 48 07/09/2018 0019   CHOLHDL 2.9 07/09/2018 0019   VLDL 30 07/09/2018 0019   LDLCALC 63 07/09/2018 0019     WEIGHTS: Wt Readings from Last 3 Encounters:  09/17/18 125 lb 12.8 oz (57.1 kg)  07/09/18 125 lb (56.7 kg)  01/16/16 130 lb 12.8 oz (59.3 kg)    VITALS: BP (!) 110/52   Pulse 83   Ht 5\' 3"  (1.6 m)   Wt 125 lb 12.8 oz (57.1 kg)   SpO2 95%   BMI 22.28 kg/m   EXAM: General appearance: alert and no distress Neck: no carotid bruit, no JVD and thyroid not enlarged, symmetric, no tenderness/mass/nodules Lungs: clear to auscultation bilaterally Heart: regular rate and rhythm, S1, S2 normal, no murmur, click, rub or gallop Abdomen: soft, non-tender; bowel sounds normal; no masses,  no organomegaly Extremities: extremities normal, atraumatic, no  cyanosis or edema Pulses: 2+ and symmetric Skin: Skin color, texture, turgor normal. No rashes or lesions Neurologic: Grossly normal Psych: Pleasant  EKG: Deferred  ASSESSMENT: 1. Atypical chest pain-low coronary artery calcium score of 12, minimal nonobstructive coronary disease (08/2018-by coronary artery CT scan) 2. Normal LVEF by echo (08/2018) 3. No evidence of atrial fibrillation  PLAN: 1.   Ms. Vierra has chest discomfort which is likely noncardiac.  She has a long-standing GI history and is on a motility agent.  She is never had a previous esophageal dilatation however reports some difficulty with food getting stuck in pills when she swallows.  I like to refer to Dr. Elnoria Howard for further evaluation of this.  The nitroglycerin she is taking may be helpful as it relaxes the smooth muscle of the esophagus as well.  Follow-up in 6 months.  Chrystie Nose, MD, [facility], FACP    [facility]  Medical Director of the Advanced Lipid Disorders &  Cardiovascular Risk Reduction Clinic Diplomate of the American Board of Clinical Lipidology Attending Cardiologist  Direct Dial: [phone]  Fax: [phone]  Website:  [email or web address]   Lisette Abu Carnell Beavers 09/18/2018, 1:26 PM

## 2018-10-19 DIAGNOSIS — Z720 Tobacco use: Secondary | ICD-10-CM | POA: Diagnosis not present

## 2018-10-19 DIAGNOSIS — Z87891 Personal history of nicotine dependence: Secondary | ICD-10-CM | POA: Diagnosis not present

## 2018-10-19 DIAGNOSIS — Z5181 Encounter for therapeutic drug level monitoring: Secondary | ICD-10-CM | POA: Diagnosis not present

## 2018-10-19 DIAGNOSIS — M961 Postlaminectomy syndrome, not elsewhere classified: Secondary | ICD-10-CM | POA: Diagnosis not present

## 2018-10-19 DIAGNOSIS — Z716 Tobacco abuse counseling: Secondary | ICD-10-CM | POA: Diagnosis not present

## 2018-10-19 DIAGNOSIS — F17200 Nicotine dependence, unspecified, uncomplicated: Secondary | ICD-10-CM | POA: Diagnosis not present

## 2018-10-19 DIAGNOSIS — M545 Low back pain: Secondary | ICD-10-CM | POA: Diagnosis not present

## 2018-10-19 DIAGNOSIS — G894 Chronic pain syndrome: Secondary | ICD-10-CM | POA: Diagnosis not present

## 2018-10-20 DIAGNOSIS — Z87891 Personal history of nicotine dependence: Secondary | ICD-10-CM | POA: Diagnosis not present

## 2018-10-20 DIAGNOSIS — G250 Essential tremor: Secondary | ICD-10-CM | POA: Diagnosis not present

## 2018-10-20 DIAGNOSIS — R51 Headache: Secondary | ICD-10-CM | POA: Diagnosis not present

## 2018-10-20 DIAGNOSIS — E119 Type 2 diabetes mellitus without complications: Secondary | ICD-10-CM | POA: Diagnosis not present

## 2018-10-20 DIAGNOSIS — Z23 Encounter for immunization: Secondary | ICD-10-CM | POA: Diagnosis not present

## 2018-10-20 DIAGNOSIS — Z0001 Encounter for general adult medical examination with abnormal findings: Secondary | ICD-10-CM | POA: Diagnosis not present

## 2018-10-20 DIAGNOSIS — E782 Mixed hyperlipidemia: Secondary | ICD-10-CM | POA: Diagnosis not present

## 2018-10-20 DIAGNOSIS — I10 Essential (primary) hypertension: Secondary | ICD-10-CM | POA: Diagnosis not present

## 2018-10-20 DIAGNOSIS — J449 Chronic obstructive pulmonary disease, unspecified: Secondary | ICD-10-CM | POA: Diagnosis not present

## 2018-10-20 DIAGNOSIS — R569 Unspecified convulsions: Secondary | ICD-10-CM | POA: Diagnosis not present

## 2018-10-20 DIAGNOSIS — K3184 Gastroparesis: Secondary | ICD-10-CM | POA: Diagnosis not present

## 2018-10-20 DIAGNOSIS — I4891 Unspecified atrial fibrillation: Secondary | ICD-10-CM | POA: Diagnosis not present

## 2018-11-03 DIAGNOSIS — E119 Type 2 diabetes mellitus without complications: Secondary | ICD-10-CM | POA: Diagnosis not present

## 2018-11-03 DIAGNOSIS — Z87891 Personal history of nicotine dependence: Secondary | ICD-10-CM | POA: Diagnosis not present

## 2018-11-03 DIAGNOSIS — G250 Essential tremor: Secondary | ICD-10-CM | POA: Diagnosis not present

## 2018-11-03 DIAGNOSIS — E782 Mixed hyperlipidemia: Secondary | ICD-10-CM | POA: Diagnosis not present

## 2018-11-03 DIAGNOSIS — I10 Essential (primary) hypertension: Secondary | ICD-10-CM | POA: Diagnosis not present

## 2018-11-03 DIAGNOSIS — J449 Chronic obstructive pulmonary disease, unspecified: Secondary | ICD-10-CM | POA: Diagnosis not present

## 2018-11-03 DIAGNOSIS — I4891 Unspecified atrial fibrillation: Secondary | ICD-10-CM | POA: Diagnosis not present

## 2018-11-03 DIAGNOSIS — R569 Unspecified convulsions: Secondary | ICD-10-CM | POA: Diagnosis not present

## 2019-01-12 DIAGNOSIS — Z720 Tobacco use: Secondary | ICD-10-CM | POA: Diagnosis not present

## 2019-01-12 DIAGNOSIS — Z716 Tobacco abuse counseling: Secondary | ICD-10-CM | POA: Diagnosis not present

## 2019-01-12 DIAGNOSIS — G894 Chronic pain syndrome: Secondary | ICD-10-CM | POA: Diagnosis not present

## 2019-01-12 DIAGNOSIS — F17200 Nicotine dependence, unspecified, uncomplicated: Secondary | ICD-10-CM | POA: Diagnosis not present

## 2019-01-12 DIAGNOSIS — M545 Low back pain: Secondary | ICD-10-CM | POA: Diagnosis not present

## 2019-01-12 DIAGNOSIS — Z87891 Personal history of nicotine dependence: Secondary | ICD-10-CM | POA: Diagnosis not present

## 2019-01-12 DIAGNOSIS — M961 Postlaminectomy syndrome, not elsewhere classified: Secondary | ICD-10-CM | POA: Diagnosis not present

## 2019-01-12 DIAGNOSIS — Z5181 Encounter for therapeutic drug level monitoring: Secondary | ICD-10-CM | POA: Diagnosis not present

## 2019-01-20 DIAGNOSIS — M961 Postlaminectomy syndrome, not elsewhere classified: Secondary | ICD-10-CM | POA: Diagnosis not present

## 2019-01-20 DIAGNOSIS — M1288 Other specific arthropathies, not elsewhere classified, other specified site: Secondary | ICD-10-CM | POA: Diagnosis not present

## 2019-01-20 DIAGNOSIS — Z5181 Encounter for therapeutic drug level monitoring: Secondary | ICD-10-CM | POA: Diagnosis not present

## 2019-01-20 DIAGNOSIS — G894 Chronic pain syndrome: Secondary | ICD-10-CM | POA: Diagnosis not present

## 2019-01-20 DIAGNOSIS — M47816 Spondylosis without myelopathy or radiculopathy, lumbar region: Secondary | ICD-10-CM | POA: Diagnosis not present

## 2019-01-20 DIAGNOSIS — M545 Low back pain: Secondary | ICD-10-CM | POA: Diagnosis not present

## 2019-06-02 DIAGNOSIS — I10 Essential (primary) hypertension: Secondary | ICD-10-CM | POA: Diagnosis not present

## 2019-06-02 DIAGNOSIS — Z87891 Personal history of nicotine dependence: Secondary | ICD-10-CM | POA: Diagnosis not present

## 2019-06-02 DIAGNOSIS — E119 Type 2 diabetes mellitus without complications: Secondary | ICD-10-CM | POA: Diagnosis not present

## 2019-06-02 DIAGNOSIS — R569 Unspecified convulsions: Secondary | ICD-10-CM | POA: Diagnosis not present

## 2019-06-02 DIAGNOSIS — E782 Mixed hyperlipidemia: Secondary | ICD-10-CM | POA: Diagnosis not present

## 2019-06-02 DIAGNOSIS — G250 Essential tremor: Secondary | ICD-10-CM | POA: Diagnosis not present

## 2019-06-02 DIAGNOSIS — Z0000 Encounter for general adult medical examination without abnormal findings: Secondary | ICD-10-CM | POA: Diagnosis not present

## 2019-06-02 DIAGNOSIS — Z0100 Encounter for examination of eyes and vision without abnormal findings: Secondary | ICD-10-CM | POA: Diagnosis not present

## 2019-06-02 DIAGNOSIS — I4891 Unspecified atrial fibrillation: Secondary | ICD-10-CM | POA: Diagnosis not present

## 2019-06-02 DIAGNOSIS — Z136 Encounter for screening for cardiovascular disorders: Secondary | ICD-10-CM | POA: Diagnosis not present

## 2019-06-02 DIAGNOSIS — J449 Chronic obstructive pulmonary disease, unspecified: Secondary | ICD-10-CM | POA: Diagnosis not present

## 2019-06-02 DIAGNOSIS — J0100 Acute maxillary sinusitis, unspecified: Secondary | ICD-10-CM | POA: Diagnosis not present

## 2019-06-28 ENCOUNTER — Other Ambulatory Visit: Payer: Self-pay

## 2019-08-02 ENCOUNTER — Other Ambulatory Visit: Payer: Self-pay

## 2019-08-02 ENCOUNTER — Encounter (HOSPITAL_COMMUNITY): Payer: Self-pay

## 2019-08-02 ENCOUNTER — Emergency Department (HOSPITAL_COMMUNITY): Payer: PPO

## 2019-08-02 ENCOUNTER — Emergency Department (HOSPITAL_COMMUNITY)
Admission: EM | Admit: 2019-08-02 | Discharge: 2019-08-02 | Disposition: A | Discharge status: Home / Self Care | Payer: PPO | Attending: Emergency Medicine | Admitting: Emergency Medicine

## 2019-08-02 DIAGNOSIS — R1111 Vomiting without nausea: Secondary | ICD-10-CM | POA: Diagnosis not present

## 2019-08-02 DIAGNOSIS — J449 Chronic obstructive pulmonary disease, unspecified: Secondary | ICD-10-CM | POA: Insufficient documentation

## 2019-08-02 DIAGNOSIS — I10 Essential (primary) hypertension: Secondary | ICD-10-CM | POA: Insufficient documentation

## 2019-08-02 DIAGNOSIS — K529 Noninfective gastroenteritis and colitis, unspecified: Secondary | ICD-10-CM | POA: Diagnosis not present

## 2019-08-02 DIAGNOSIS — Z79899 Other long term (current) drug therapy: Secondary | ICD-10-CM | POA: Diagnosis not present

## 2019-08-02 DIAGNOSIS — E119 Type 2 diabetes mellitus without complications: Secondary | ICD-10-CM | POA: Diagnosis not present

## 2019-08-02 DIAGNOSIS — Z20828 Contact with and (suspected) exposure to other viral communicable diseases: Secondary | ICD-10-CM | POA: Diagnosis not present

## 2019-08-02 DIAGNOSIS — R109 Unspecified abdominal pain: Secondary | ICD-10-CM | POA: Diagnosis not present

## 2019-08-02 DIAGNOSIS — Z7982 Long term (current) use of aspirin: Secondary | ICD-10-CM | POA: Diagnosis not present

## 2019-08-02 DIAGNOSIS — R197 Diarrhea, unspecified: Secondary | ICD-10-CM | POA: Diagnosis not present

## 2019-08-02 DIAGNOSIS — I491 Atrial premature depolarization: Secondary | ICD-10-CM | POA: Diagnosis not present

## 2019-08-02 DIAGNOSIS — Z7984 Long term (current) use of oral hypoglycemic drugs: Secondary | ICD-10-CM | POA: Insufficient documentation

## 2019-08-02 DIAGNOSIS — R112 Nausea with vomiting, unspecified: Secondary | ICD-10-CM

## 2019-08-02 DIAGNOSIS — F17210 Nicotine dependence, cigarettes, uncomplicated: Secondary | ICD-10-CM | POA: Insufficient documentation

## 2019-08-02 DIAGNOSIS — R110 Nausea: Secondary | ICD-10-CM | POA: Diagnosis not present

## 2019-08-02 DIAGNOSIS — R0902 Hypoxemia: Secondary | ICD-10-CM | POA: Diagnosis not present

## 2019-08-02 DIAGNOSIS — K449 Diaphragmatic hernia without obstruction or gangrene: Secondary | ICD-10-CM | POA: Diagnosis not present

## 2019-08-02 LAB — COMPREHENSIVE METABOLIC PANEL
ALT: 30 U/L (ref 0–44)
AST: 18 U/L (ref 15–41)
Albumin: 4.5 g/dL (ref 3.5–5.0)
Alkaline Phosphatase: 75 U/L (ref 38–126)
Anion gap: 14 (ref 5–15)
BUN: 21 mg/dL (ref 8–23)
CO2: 22 mmol/L (ref 22–32)
Calcium: 9.7 mg/dL (ref 8.9–10.3)
Chloride: 96 mmol/L — ABNORMAL LOW (ref 98–111)
Creatinine, Ser: 0.89 mg/dL (ref 0.44–1.00)
GFR calc Af Amer: >60 mL/min (ref >60)
GFR calc non Af Amer: >60 mL/min (ref >60)
Glucose, Bld: 159 mg/dL — ABNORMAL HIGH (ref 70–99)
Potassium: 3.4 mmol/L — ABNORMAL LOW (ref 3.5–5.1)
Sodium: 132 mmol/L — ABNORMAL LOW (ref 135–145)
Total Bilirubin: 0.7 mg/dL (ref 0.3–1.2)
Total Protein: 7.8 g/dL (ref 6.5–8.1)

## 2019-08-02 LAB — CBC WITH DIFFERENTIAL/PLATELET
Abs Immature Granulocytes: 0.05 10*3/uL (ref 0.00–0.07)
Basophils Absolute: 0 10*3/uL (ref 0.0–0.1)
Basophils Relative: 0 %
Eosinophils Absolute: 0.2 10*3/uL (ref 0.0–0.5)
Eosinophils Relative: 2 %
HCT: 39.3 % (ref 36.0–46.0)
Hemoglobin: 13.1 g/dL (ref 12.0–15.0)
Immature Granulocytes: 0 %
Lymphocytes Relative: 19 %
Lymphs Abs: 2.3 10*3/uL (ref 0.7–4.0)
MCH: 30 pg (ref 26.0–34.0)
MCHC: 33.3 g/dL (ref 30.0–36.0)
MCV: 89.9 fL (ref 80.0–100.0)
Monocytes Absolute: 0.7 10*3/uL (ref 0.1–1.0)
Monocytes Relative: 6 %
Neutro Abs: 8.8 10*3/uL — ABNORMAL HIGH (ref 1.7–7.7)
Neutrophils Relative %: 73 %
Platelets: 301 10*3/uL (ref 150–400)
RBC: 4.37 MIL/uL (ref 3.87–5.11)
RDW: 12.7 % (ref 11.5–15.5)
WBC: 12.1 10*3/uL — ABNORMAL HIGH (ref 4.0–10.5)
nRBC: 0 % (ref 0.0–0.2)

## 2019-08-02 LAB — LIPASE, BLOOD: Lipase: 68 U/L — ABNORMAL HIGH (ref 11–51)

## 2019-08-02 LAB — URINALYSIS, ROUTINE W REFLEX MICROSCOPIC
Bilirubin Urine: NEGATIVE
Glucose, UA: NEGATIVE mg/dL
Ketones, ur: 20 mg/dL — AB
Nitrite: NEGATIVE
Protein, ur: 30 mg/dL — AB
Specific Gravity, Urine: 1.016 (ref 1.005–1.030)
pH: 5 (ref 5.0–8.0)

## 2019-08-02 LAB — SARS CORONAVIRUS 2 BY RT PCR (HOSPITAL ORDER, PERFORMED IN ~~LOC~~ HOSPITAL LAB): SARS Coronavirus 2: NEGATIVE

## 2019-08-02 MED ORDER — METRONIDAZOLE 500 MG PO TABS: 500.0000 mg | ORAL_TABLET | Freq: Three times a day (TID) | ORAL | 0 refills | Status: AC

## 2019-08-02 MED ORDER — SODIUM CHLORIDE 0.9 % IV BOLUS
1000.0000 mL | Freq: Once | INTRAVENOUS | Status: AC
  Administered 2019-08-02: 10:00:00 1000 mL via INTRAVENOUS

## 2019-08-02 MED ORDER — IOHEXOL 300 MG/ML  SOLN
100.0000 mL | Freq: Once | INTRAMUSCULAR | Status: AC | PRN
  Administered 2019-08-02: 100 mL via INTRAVENOUS

## 2019-08-02 MED ORDER — ONDANSETRON HCL 4 MG PO TABS: 4.0000 mg | ORAL_TABLET | Freq: Three times a day (TID) | ORAL | 0 refills | Status: DC | PRN

## 2019-08-02 MED ORDER — CIPROFLOXACIN HCL 500 MG PO TABS: 500.0000 mg | ORAL_TABLET | Freq: Two times a day (BID) | ORAL | 0 refills | Status: DC

## 2019-08-02 MED ORDER — SODIUM CHLORIDE (PF) 0.9 % IJ SOLN
INTRAMUSCULAR | Status: AC
  Filled 2019-08-02: qty 50

## 2019-08-02 NOTE — ED Triage Notes (Signed)
Patient BIB EMS from home with c/o nausea/vomiting/diarrhea x4 days. Patient denies fever. Patient reports on Tuesday she ran out of her Oxycontin, Oxycodone, and Ambien- patient reports her doctor is out of town and she cannot get them refilled. Patient AxOx4 in triage. Patient hypertensive for EMS, but states she has a history of HTN and has not taken her antihypertensive medication this morning.  Patient had positive orthostatic changes for EMS upon standing.   20g PIV placed to left FA by EMS- 350mL IV NS and 4mg  IV zofran administered by EMS en route.

## 2019-08-02 NOTE — ED Notes (Signed)
Discharge instructions reviewed with patient. Patient verbalizes understanding. VSS.   

## 2019-08-02 NOTE — Discharge Instructions (Signed)
Please read!  It is very likely that your colitis is caused by a virus, which should get better in the next few days.  However, if you are not improving in 2-3 days, or if you begin having fevers (temperature > 100.83F), it is reasonable to start taking the two antibiotics that I prescribed for colitis. You should ALWAYS take these with some food, even a small amount.  Do NOT drink alcohol while taking flagyl -- it will make you sick.    Also, please be aware that in some patients, ciprofloxacin can cause low blood sugar when combined with diabetes medications.  Remember to always take this medication with food or juice, and keep juice or candy nearby.  Check your sugars at home.

## 2019-08-02 NOTE — ED Provider Notes (Signed)
[facility]-EMERGENCY DEPT Provider Note   CSN: [id number] Arrival date & time: 08/02/19  8366     History   Chief Complaint Chief Complaint  Patient presents with  . Emesis  . Diarrhea    HPI Nicole Pittman is a 68 y.o. female past medical history of diabetes, chronic back pain, hypertension, high cholesterol presented to the emergency department with complaint of nausea and vomiting for 4 days.  She reports she is on chronic oxycodone and OxyContin for her back pain, taking 20 mg of OxyContin daily as well as 5 mg of oxycodone twice daily, as well as 10 mg of Ambien at night.  She reports she ran out of all of these medications 6 days ago, which is 2 days prior to her symptom onset.  She is unsure whether she is withdrawing.  She says she has never run out of her medications before.  She reports her primary care provider is currently in the [place] practicing abroad, and she has been unable to reach her provider, and there is no follow-up or alternative provider available in their office place.  She reports 8-10 episodes of watery diarrhea daily.  There is no blood in his diarrhea.  She reports 1 day of nausea and vomiting, with 3 episodes of nonbloody emesis this morning.  Per nursing report EMS gave the patient 300 cc of fluid and 4 mg of Zofran, which patient reports is improved her nausea.  She denies any abdominal pain.  She denies any dysuria or hematuria.  She denies any fevers or chills.  She denies any sick contacts at home.  Denies any cough or congestion.  She reports she has not run out of any of her other medications.  No recent antibiotic use, hospitalizations, travel, or sick contacts.  No prior hx of C. Diff infections.     HPI  Past Medical History:  Diagnosis Date  . Adhesive arachnoiditis   . Arthritis   . Back problem    spurs  . COPD (chronic obstructive pulmonary disease) (HCC)   . Diabetes (HCC)   . High cholesterol   . Hypertension    . Neck problem    bad disc    Patient Active Problem List   Diagnosis Date Noted  . Paroxysmal atrial fibrillation (HCC)   . Chest pain, precordial 07/08/2018  . Tremor of face and hands 01/21/2016  . HTN (hypertension) 01/21/2016  . HLD (hyperlipidemia) 01/21/2016  . Diabetes (HCC) 01/21/2016  . COPD (chronic obstructive pulmonary disease) (HCC) 01/21/2016    Past Surgical History:  Procedure Laterality Date  . BACK SURGERY  1978   L5  . CHOLECYSTECTOMY  1992  . KNEE SURGERY Bilateral 1993  . OVARIAN CYST REMOVAL  1985  . TONSILLECTOMY  1959  . WRIST SURGERY Bilateral 2003     OB History   No obstetric history on file.      Home Medications    Prior to Admission medications   Medication Sig Start Date End Date Taking? Authorizing Provider  amitriptyline (ELAVIL) 25 MG tablet Take 25 mg by mouth at bedtime.   Yes [provider]  aspirin 81 MG tablet Take 81 mg by mouth daily with supper.    Yes [provider]  atorvastatin (LIPITOR) 40 MG tablet Take 40 mg by mouth at bedtime.    Yes [provider]  baclofen (LIORESAL) 10 MG tablet Take 10 mg by mouth 2 (two) times daily.  01/01/16  Yes  [provider]  Cholecalciferol (VITAMIN D-3 PO) Take 1 capsule by mouth daily.   Yes [provider]  fenofibrate (TRICOR) 48 MG tablet Take 48 mg by mouth at bedtime.    Yes [provider]  glipiZIDE-metformin (METAGLIP) 5-500 MG tablet Take 1 tablet by mouth 2 (two) times daily.  12/25/15  Yes [provider]  levETIRAcetam (KEPPRA) 500 MG tablet Take 500 mg by mouth daily.   Yes [provider]  metoCLOPramide (REGLAN) 10 MG tablet Take 10 mg by mouth 4 (four) times daily.   Yes [provider]  metoprolol tartrate (LOPRESSOR) 25 MG tablet Take 1 tablet (25 mg total) by mouth 2 (two) times daily. 08/04/18  Yes Jodelle GrossLawrence, Kathryn M, NP  nitroGLYCERIN (NITROSTAT) 0.4 MG SL tablet Place 1 tablet (0.4 mg  total) under the tongue every 5 (five) minutes as needed for chest pain. MAX 3 doses in 15 minutes 08/04/18 08/02/19 Yes Jodelle GrossLawrence, Kathryn M, NP  oxyCODONE (OXY IR/ROXICODONE) 5 MG immediate release tablet Take 5 mg by mouth 2 (two) times daily.  01/02/16  Yes [provider]  OXYCONTIN 20 MG 12 hr tablet Take 20 mg by mouth daily with supper.  12/19/15  Yes [provider]  PROAIR HFA 108 (90 Base) MCG/ACT inhaler Inhale 2 puffs into the lungs 2 (two) times daily.  01/03/16  Yes [provider]  quinapril (ACCUPRIL) 20 MG tablet Take 20 mg by mouth daily. 01/12/16  Yes [provider]  triamterene-hydrochlorothiazide (MAXZIDE-25) 37.5-25 MG tablet Take 1 tablet by mouth daily. 01/06/16  Yes [provider]  zolpidem (AMBIEN) 10 MG tablet Take 10 mg by mouth daily. 12/29/15  Yes [provider]  ciprofloxacin (CIPRO) 500 MG tablet Take 1 tablet (500 mg total) by mouth 2 (two) times daily for 7 days. 08/04/19 08/11/19  Terald Sleeperrifan, Lian Tanori J, MD  EASY COMFORT LANCETS MISC 4 (four) times daily. for testing 12/14/15   [provider]  FREESTYLE LITE test strip 4 (four) times daily. for testing 12/14/15   [provider]  metroNIDAZOLE (FLAGYL) 500 MG tablet Take 1 tablet (500 mg total) by mouth 3 (three) times daily for 7 days. 08/04/19 08/11/19  Terald Sleeperrifan, Jazmin Ley J, MD  ondansetron (ZOFRAN) 4 MG tablet Take 1 tablet (4 mg total) by mouth every 8 (eight) hours as needed for up to 12 doses for nausea or vomiting. 08/02/19   Terald Sleeperrifan, Lyla Jasek J, MD    Family History Family History  Problem Relation Age of Onset  . Rheum arthritis Mother   . Hypertension Mother   . Parkinson's disease Mother   . Parkinsonism Mother   . Diabetes Father   . Breast cancer Maternal Aunt   . Lupus Maternal Aunt     Social History Social History   Tobacco Use  . Smoking status: Current Every Day Smoker    Packs/day: 0.50    Types: Cigarettes  . Smokeless tobacco: Never  Used  Substance Use Topics  . Alcohol use: No    Alcohol/week: 0.0 standard drinks  . Drug use: No     Allergies   Fentanyl, Codeine, Morphine and related, Symbicort [budesonide-formoterol fumarate], and Zanaflex [tizanidine]   Review of Systems Review of Systems  Constitutional: Negative for chills and fever.  Respiratory: Negative for cough and shortness of breath.   Cardiovascular: Negative for chest pain and palpitations.  Gastrointestinal: Positive for diarrhea, nausea and vomiting. Negative for abdominal distention, abdominal pain, anal bleeding, blood in stool and constipation.  Genitourinary: Negative for difficulty urinating, dysuria, flank pain, frequency and hematuria.  Skin: Negative for rash and wound.  Neurological: Negative for seizures and syncope.  Psychiatric/Behavioral: Negative for agitation and confusion.  All other systems reviewed and are negative.    Physical Exam Updated Vital Signs BP (!) 168/82 (BP Location: Right Arm)   Pulse 95   Temp 98 F (36.7 C) (Oral)   Resp 18   Ht 5\' 3"  (1.6 m)   Wt 58.5 kg   SpO2 98%   BMI 22.85 kg/m   Physical Exam Vitals signs and nursing note reviewed.  Constitutional:      General: She is not in acute distress.    Appearance: She is well-developed.  HENT:     Head: Normocephalic and atraumatic.  Eyes:     General: No scleral icterus.    Conjunctiva/sclera: Conjunctivae normal.  Neck:     Musculoskeletal: Neck supple.  Cardiovascular:     Rate and Rhythm: Normal rate and regular rhythm.     Pulses: Normal pulses.  Pulmonary:     Effort: Pulmonary effort is normal. No respiratory distress.     Comments: Hoarse cough on exam Abdominal:     Palpations: Abdomen is soft.     Tenderness: There is no abdominal tenderness. There is no right CVA tenderness, left CVA tenderness, guarding or rebound. Negative signs include Murphy's sign and McBurney's sign.  Skin:    General: Skin is warm and dry.   Neurological:     Mental Status: She is alert.      ED Treatments / Results  Labs (all labs ordered are listed, but only abnormal results are displayed) Labs Reviewed  CBC WITH DIFFERENTIAL/PLATELET - Abnormal; Notable for the following components:      Result Value   WBC 12.1 (*)    Neutro Abs 8.8 (*)    All other components within normal limits  COMPREHENSIVE METABOLIC PANEL - Abnormal; Notable for the following components:   Sodium 132 (*)    Potassium 3.4 (*)    Chloride 96 (*)    Glucose, Bld 159 (*)    All other components within normal limits  LIPASE, BLOOD - Abnormal; Notable for the following components:   Lipase 68 (*)    All other components within normal limits  URINALYSIS, ROUTINE W REFLEX MICROSCOPIC - Abnormal; Notable for the following components:   Hgb urine dipstick SMALL (*)    Ketones, ur 20 (*)    Protein, ur 30 (*)    Leukocytes,Ua SMALL (*)    Bacteria, UA MANY (*)    All other components within normal limits  SARS CORONAVIRUS 2 (HOSPITAL ORDER, PERFORMED IN [facility])    EKG None  Radiology Ct Abdomen Pelvis W Contrast  Result Date: 08/02/2019 CLINICAL DATA:  Abdominal pain.  Evaluate for diverticulitis. EXAM: CT ABDOMEN AND PELVIS WITH CONTRAST TECHNIQUE: Multidetector CT imaging of the abdomen and pelvis was performed using the standard protocol following bolus administration of intravenous contrast. CONTRAST:  OMNIPAQUE IOHEXOL 300 MG/ML  SOLN COMPARISON:  None. FINDINGS: Lower chest: No acute abnormality. Hepatobiliary: Hepatic steatosis. Previous cholecystectomy. No biliary dilatation. Pancreas: Unremarkable. No pancreatic ductal dilatation or surrounding inflammatory changes. Spleen: Normal in size without focal abnormality. Adrenals/Urinary Tract: Normal appearance of the adrenal glands. No kidney stone or hydronephrosis. Urinary bladder appears normal. Stomach/Bowel: Small hiatal hernia. Normal appearance of the small bowel  loops. No small bowel wall thickening, inflammation or distension. Mild diffuse colonic wall  thickening is identified. No significant diverticular disease identified. No acute diverticulitis noted. Vascular/Lymphatic: Aortic atherosclerosis. No abdominopelvic adenopathy. Reproductive: The uterus and adnexal structures are unremarkable. Other: No free fluid, or fluid collections. Musculoskeletal: Spondylosis identified. No aggressive lytic or sclerotic bone lesions. IMPRESSION: 1. Mild diffuse colonic wall thickening concerning for uncomplicated colitis. No findings to suggest diverticulitis. 2. Small hiatal hernia. 3.  Aortic Atherosclerosis (ICD10-I70.0). Electronically Signed   By: Signa Kellaylor  Stroud M.D.   On: 08/02/2019 12:13    Procedures Procedures (including critical care time)  Medications Ordered in ED Medications  sodium chloride 0.9 % bolus 1,000 mL (0 mLs Intravenous Stopped 08/02/19 1048)  iohexol (OMNIPAQUE) 300 MG/ML solution 100 mL (100 mLs Intravenous Contrast Given 08/02/19 1143)     Initial Impression / Assessment and Plan / ED Course  I have reviewed the triage vital signs and the nursing notes.  Pertinent labs & imaging results that were available during my care of the patient were reviewed by me and considered in my medical decision making (see chart for details).   68 year old female presented emergency department 4 days of diarrhea and 1 day of nonbloody emesis.  She reports running out of her chronic opioid prescription 2 days prior to symptom onset.  Differential includes opioid withdrawal versus viral gastroenteritis versus DKA versus COVID-19 versus diverticulitis vs other  She has no focal tenderness to suggest cholecystitis or acute appendicitis.  She appears quite well on exam and has a clinically benign abdominal exam.  She reports no dysuria or flank pain to suggest pyelonephritis.  We will check blood work and UA.  She has ready received Zofran from EMS and feels  better.  We will give 1L IVF.  We will also check her for COVID-19.  It is very possible her symptoms are caused by opioid withdrawal, however she will need to reach out to her primary care provider to refill this prescription.  She reports she has an alternative provider she can reach out to.  She does not appear to be in any acute distress in the emergency department, displays no signs or symptoms of acute opioid withdrawal at this time. I explained to her that we do not refill chronic pain medicine prescriptions in the emergency department.  She verbalizes understanding of this.     Clinical Course as of Aug 01 1933  Sheral FlowMon Aug 02, 2019  1029 Patient is feeling somewhat better with her IV fluids.  Discussed with her pursuing a CT abdomen pelvis to evaluate for diverticulitis, she feels her symptoms may be similar to her prior episode of diverticulitis.  With her minor leukocytosis here I believe it is reasonable option.   [MT]  1126 SARS Coronavirus 2: NEGATIVE [MT]  1217 IMPRESSION: 1. Mild diffuse colonic wall thickening concerning for uncomplicated colitis. No findings to suggest diverticulitis. 2. Small hiatal hernia. 3. Aortic Atherosclerosis (ICD10-I70.0).   [MT]  1251 Patient is feeling better since arrival in the ED.  We discussed her CT findings including colitis, which appears to be a mild case.  I reported this could be caused by  a virus.  We engaged in shared medical decision making regarding antibiotic usage, and at this time she would prefer to watch and wait to see if her symptoms will improve.  If they do not improve in the next 2 to 3 days, or if she begins having fevers, or bloody stool, I advised her to begin taking both antibiotics.  I will provide her a paper script.  We discussed the risk of these antibiotics.   [MT]  1252 As she has no urinary symptoms, and she has nitrate negative, I am not convinced she has a UTI.  As she wishes to avoid extra antibiotics at this time, we  will hold off on this treatment.   [MT]    Clinical Course User Index [MT] Wyvonnia Dusky, MD        Final Clinical Impressions(s) / ED Diagnoses   Final diagnoses:  Colitis  Nausea vomiting and diarrhea    ED Discharge Orders         Ordered    metroNIDAZOLE (FLAGYL) 500 MG tablet  3 times daily     08/02/19 1248    ciprofloxacin (CIPRO) 500 MG tablet  2 times daily     08/02/19 1248    ondansetron (ZOFRAN) 4 MG tablet  Every 8 hours PRN     08/02/19 1248           Wyvonnia Dusky, MD 08/02/19 1935

## 2019-08-04 ENCOUNTER — Other Ambulatory Visit: Payer: Self-pay

## 2019-08-04 ENCOUNTER — Inpatient Hospital Stay
Admission: EM | Admit: 2019-08-04 | Discharge: 2019-08-07 | DRG: 392 | Disposition: A | Discharge status: Home / Self Care | Payer: PPO | Attending: Internal Medicine | Admitting: Internal Medicine

## 2019-08-04 ENCOUNTER — Encounter: Payer: Self-pay | Admitting: Emergency Medicine

## 2019-08-04 DIAGNOSIS — F17210 Nicotine dependence, cigarettes, uncomplicated: Secondary | ICD-10-CM | POA: Diagnosis present

## 2019-08-04 DIAGNOSIS — E876 Hypokalemia: Secondary | ICD-10-CM | POA: Diagnosis present

## 2019-08-04 DIAGNOSIS — Z7984 Long term (current) use of oral hypoglycemic drugs: Secondary | ICD-10-CM

## 2019-08-04 DIAGNOSIS — Z720 Tobacco use: Secondary | ICD-10-CM | POA: Diagnosis not present

## 2019-08-04 DIAGNOSIS — Z9049 Acquired absence of other specified parts of digestive tract: Secondary | ICD-10-CM

## 2019-08-04 DIAGNOSIS — E785 Hyperlipidemia, unspecified: Secondary | ICD-10-CM | POA: Diagnosis not present

## 2019-08-04 DIAGNOSIS — Z79891 Long term (current) use of opiate analgesic: Secondary | ICD-10-CM

## 2019-08-04 DIAGNOSIS — E119 Type 2 diabetes mellitus without complications: Secondary | ICD-10-CM | POA: Diagnosis not present

## 2019-08-04 DIAGNOSIS — Z7982 Long term (current) use of aspirin: Secondary | ICD-10-CM

## 2019-08-04 DIAGNOSIS — J449 Chronic obstructive pulmonary disease, unspecified: Secondary | ICD-10-CM | POA: Diagnosis present

## 2019-08-04 DIAGNOSIS — R110 Nausea: Secondary | ICD-10-CM | POA: Diagnosis not present

## 2019-08-04 DIAGNOSIS — Z7951 Long term (current) use of inhaled steroids: Secondary | ICD-10-CM | POA: Diagnosis not present

## 2019-08-04 DIAGNOSIS — Z23 Encounter for immunization: Secondary | ICD-10-CM | POA: Diagnosis not present

## 2019-08-04 DIAGNOSIS — K529 Noninfective gastroenteritis and colitis, unspecified: Secondary | ICD-10-CM | POA: Diagnosis present

## 2019-08-04 DIAGNOSIS — R112 Nausea with vomiting, unspecified: Secondary | ICD-10-CM | POA: Diagnosis not present

## 2019-08-04 DIAGNOSIS — R000 Tachycardia, unspecified: Secondary | ICD-10-CM | POA: Diagnosis not present

## 2019-08-04 DIAGNOSIS — Z833 Family history of diabetes mellitus: Secondary | ICD-10-CM | POA: Diagnosis not present

## 2019-08-04 DIAGNOSIS — Z20828 Contact with and (suspected) exposure to other viral communicable diseases: Secondary | ICD-10-CM | POA: Diagnosis not present

## 2019-08-04 DIAGNOSIS — I10 Essential (primary) hypertension: Secondary | ICD-10-CM | POA: Diagnosis not present

## 2019-08-04 DIAGNOSIS — E7800 Pure hypercholesterolemia, unspecified: Secondary | ICD-10-CM | POA: Diagnosis not present

## 2019-08-04 DIAGNOSIS — Z79899 Other long term (current) drug therapy: Secondary | ICD-10-CM

## 2019-08-04 DIAGNOSIS — E860 Dehydration: Secondary | ICD-10-CM | POA: Diagnosis not present

## 2019-08-04 DIAGNOSIS — Z03818 Encounter for observation for suspected exposure to other biological agents ruled out: Secondary | ICD-10-CM | POA: Diagnosis not present

## 2019-08-04 LAB — COMPREHENSIVE METABOLIC PANEL
ALT: 25 U/L (ref 0–44)
AST: 18 U/L (ref 15–41)
Albumin: 4.6 g/dL (ref 3.5–5.0)
Alkaline Phosphatase: 78 U/L (ref 38–126)
Anion gap: 18 — ABNORMAL HIGH (ref 5–15)
BUN: 21 mg/dL (ref 8–23)
CO2: 17 mmol/L — ABNORMAL LOW (ref 22–32)
Calcium: 9.4 mg/dL (ref 8.9–10.3)
Chloride: 101 mmol/L (ref 98–111)
Creatinine, Ser: 0.93 mg/dL (ref 0.44–1.00)
GFR calc Af Amer: >60 mL/min (ref >60)
GFR calc non Af Amer: >60 mL/min (ref >60)
Glucose, Bld: 135 mg/dL — ABNORMAL HIGH (ref 70–99)
Potassium: 3.5 mmol/L (ref 3.5–5.1)
Sodium: 136 mmol/L (ref 135–145)
Total Bilirubin: 1.2 mg/dL (ref 0.3–1.2)
Total Protein: 7.7 g/dL (ref 6.5–8.1)

## 2019-08-04 LAB — URINALYSIS, COMPLETE (UACMP) WITH MICROSCOPIC
Bilirubin Urine: NEGATIVE
Glucose, UA: NEGATIVE mg/dL
Hgb urine dipstick: NEGATIVE
Ketones, ur: 20 mg/dL — AB
Nitrite: NEGATIVE
Protein, ur: NEGATIVE mg/dL
Specific Gravity, Urine: 1.017 (ref 1.005–1.030)
pH: 6 (ref 5.0–8.0)

## 2019-08-04 LAB — CBC WITH DIFFERENTIAL/PLATELET
Abs Immature Granulocytes: 0.25 10*3/uL — ABNORMAL HIGH (ref 0.00–0.07)
Basophils Absolute: 0.1 10*3/uL (ref 0.0–0.1)
Basophils Relative: 0 %
Eosinophils Absolute: 0.1 10*3/uL (ref 0.0–0.5)
Eosinophils Relative: 1 %
HCT: 40.8 % (ref 36.0–46.0)
Hemoglobin: 13.7 g/dL (ref 12.0–15.0)
Immature Granulocytes: 2 %
Lymphocytes Relative: 19 %
Lymphs Abs: 2.8 10*3/uL (ref 0.7–4.0)
MCH: 29.7 pg (ref 26.0–34.0)
MCHC: 33.6 g/dL (ref 30.0–36.0)
MCV: 88.3 fL (ref 80.0–100.0)
Monocytes Absolute: 1.1 10*3/uL — ABNORMAL HIGH (ref 0.1–1.0)
Monocytes Relative: 7 %
Neutro Abs: 10.8 10*3/uL — ABNORMAL HIGH (ref 1.7–7.7)
Neutrophils Relative %: 71 %
Platelets: 378 10*3/uL (ref 150–400)
RBC: 4.62 MIL/uL (ref 3.87–5.11)
RDW: 12.6 % (ref 11.5–15.5)
WBC: 15.1 10*3/uL — ABNORMAL HIGH (ref 4.0–10.5)
nRBC: 0 % (ref 0.0–0.2)

## 2019-08-04 LAB — SARS CORONAVIRUS 2 (TAT 6-24 HRS): SARS Coronavirus 2: NEGATIVE

## 2019-08-04 LAB — TROPONIN I (HIGH SENSITIVITY): Troponin I (High Sensitivity): 8 ng/L (ref <18)

## 2019-08-04 LAB — LIPASE, BLOOD: Lipase: 58 U/L — ABNORMAL HIGH (ref 11–51)

## 2019-08-04 LAB — GLUCOSE, CAPILLARY: Glucose-Capillary: 133 mg/dL — ABNORMAL HIGH (ref 70–99)

## 2019-08-04 MED ORDER — SODIUM CHLORIDE 0.9% FLUSH
3.0000 mL | Freq: Two times a day (BID) | INTRAVENOUS | Status: DC
  Administered 2019-08-04 – 2019-08-07 (×5): 3 mL via INTRAVENOUS

## 2019-08-04 MED ORDER — OXYCODONE HCL 5 MG PO TABS
5.0000 mg | ORAL_TABLET | Freq: Two times a day (BID) | ORAL | Status: DC
  Administered 2019-08-04 – 2019-08-07 (×6): 5 mg via ORAL
  Filled 2019-08-04 (×6): qty 1

## 2019-08-04 MED ORDER — OXYCODONE HCL ER 10 MG PO T12A
20.0000 mg | EXTENDED_RELEASE_TABLET | Freq: Every day | ORAL | Status: DC
  Administered 2019-08-04 – 2019-08-06 (×3): 20 mg via ORAL
  Filled 2019-08-04: qty 1
  Filled 2019-08-04 (×3): qty 2

## 2019-08-04 MED ORDER — LACTATED RINGERS IV SOLN
INTRAVENOUS | Status: AC
  Administered 2019-08-04 – 2019-08-05 (×3): via INTRAVENOUS

## 2019-08-04 MED ORDER — ASPIRIN EC 81 MG PO TBEC
81.0000 mg | DELAYED_RELEASE_TABLET | Freq: Every day | ORAL | Status: DC
  Administered 2019-08-04 – 2019-08-06 (×3): 81 mg via ORAL
  Filled 2019-08-04 (×3): qty 1

## 2019-08-04 MED ORDER — ZOLPIDEM TARTRATE 5 MG PO TABS
5.0000 mg | ORAL_TABLET | Freq: Every evening | ORAL | Status: DC | PRN
  Filled 2019-08-04: qty 1

## 2019-08-04 MED ORDER — METRONIDAZOLE IN NACL 5-0.79 MG/ML-% IV SOLN
500.0000 mg | Freq: Three times a day (TID) | INTRAVENOUS | Status: DC
  Administered 2019-08-04 – 2019-08-07 (×8): 500 mg via INTRAVENOUS
  Filled 2019-08-04 (×12): qty 100

## 2019-08-04 MED ORDER — CIPROFLOXACIN IN D5W 400 MG/200ML IV SOLN
400.0000 mg | Freq: Two times a day (BID) | INTRAVENOUS | Status: DC
  Administered 2019-08-06 – 2019-08-07 (×4): 400 mg via INTRAVENOUS
  Filled 2019-08-04 (×5): qty 200

## 2019-08-04 MED ORDER — ZOLPIDEM TARTRATE 10 MG PO TABS: 10.0000 mg | ORAL_TABLET | Freq: Every day | ORAL | Status: DC

## 2019-08-04 MED ORDER — INSULIN ASPART 100 UNIT/ML ~~LOC~~ SOLN
0.0000 [IU] | Freq: Three times a day (TID) | SUBCUTANEOUS | Status: DC
  Administered 2019-08-05 – 2019-08-07 (×3): 2 [IU] via SUBCUTANEOUS
  Filled 2019-08-04 (×3): qty 1

## 2019-08-04 MED ORDER — ZOLPIDEM TARTRATE 5 MG PO TABS
5.0000 mg | ORAL_TABLET | Freq: Every day | ORAL | Status: DC
  Administered 2019-08-04 – 2019-08-06 (×3): 5 mg via ORAL
  Filled 2019-08-04 (×4): qty 1

## 2019-08-04 MED ORDER — SODIUM CHLORIDE 0.9 % IV SOLN
INTRAVENOUS | Status: DC | PRN
  Administered 2019-08-04 – 2019-08-07 (×5): 250 mL via INTRAVENOUS

## 2019-08-04 MED ORDER — SODIUM CHLORIDE 0.9% FLUSH: 3.0000 mL | INTRAVENOUS | Status: DC | PRN

## 2019-08-04 MED ORDER — ALBUTEROL SULFATE (2.5 MG/3ML) 0.083% IN NEBU
3.0000 mL | INHALATION_SOLUTION | Freq: Two times a day (BID) | RESPIRATORY_TRACT | Status: DC
  Administered 2019-08-04 – 2019-08-05 (×2): 3 mL via RESPIRATORY_TRACT
  Filled 2019-08-04 (×2): qty 3

## 2019-08-04 MED ORDER — INFLUENZA VAC A&B SA ADJ QUAD 0.5 ML IM PRSY
0.5000 mL | PREFILLED_SYRINGE | INTRAMUSCULAR | Status: AC
  Administered 2019-08-05: 0.5 mL via INTRAMUSCULAR
  Filled 2019-08-04: qty 0.5

## 2019-08-04 MED ORDER — METOCLOPRAMIDE HCL 10 MG PO TABS
10.0000 mg | ORAL_TABLET | Freq: Four times a day (QID) | ORAL | Status: DC
  Administered 2019-08-04 – 2019-08-07 (×10): 10 mg via ORAL
  Filled 2019-08-04 (×10): qty 1

## 2019-08-04 MED ORDER — ONDANSETRON HCL 4 MG/2ML IJ SOLN
4.0000 mg | Freq: Once | INTRAMUSCULAR | Status: AC
  Administered 2019-08-04: 4 mg via INTRAVENOUS
  Filled 2019-08-04: qty 2

## 2019-08-04 MED ORDER — QUINAPRIL HCL 10 MG PO TABS
20.0000 mg | ORAL_TABLET | Freq: Every day | ORAL | Status: DC
  Administered 2019-08-04 – 2019-08-07 (×4): 20 mg via ORAL
  Filled 2019-08-04 (×4): qty 2

## 2019-08-04 MED ORDER — ENOXAPARIN SODIUM 40 MG/0.4ML ~~LOC~~ SOLN
40.0000 mg | SUBCUTANEOUS | Status: DC
  Administered 2019-08-04 – 2019-08-06 (×3): 40 mg via SUBCUTANEOUS
  Filled 2019-08-04 (×3): qty 0.4

## 2019-08-04 MED ORDER — METOPROLOL TARTRATE 25 MG PO TABS
25.0000 mg | ORAL_TABLET | Freq: Once | ORAL | Status: AC
  Administered 2019-08-04: 25 mg via ORAL
  Filled 2019-08-04: qty 1

## 2019-08-04 MED ORDER — HYDRALAZINE HCL 20 MG/ML IJ SOLN: 10.0000 mg | Freq: Four times a day (QID) | INTRAMUSCULAR | Status: DC | PRN

## 2019-08-04 MED ORDER — SODIUM CHLORIDE 0.9 % IV SOLN: 250.0000 mL | INTRAVENOUS | Status: DC | PRN

## 2019-08-04 MED ORDER — METOPROLOL TARTRATE 25 MG PO TABS
25.0000 mg | ORAL_TABLET | Freq: Two times a day (BID) | ORAL | Status: DC
  Administered 2019-08-04 – 2019-08-06 (×5): 25 mg via ORAL
  Filled 2019-08-04 (×6): qty 1

## 2019-08-04 MED ORDER — SODIUM CHLORIDE 0.9 % IV BOLUS
1000.0000 mL | Freq: Once | INTRAVENOUS | Status: AC
  Administered 2019-08-04: 1000 mL via INTRAVENOUS

## 2019-08-04 MED ORDER — ACETAMINOPHEN 650 MG RE SUPP: 650.0000 mg | Freq: Four times a day (QID) | RECTAL | Status: DC | PRN

## 2019-08-04 MED ORDER — CIPROFLOXACIN IN D5W 400 MG/200ML IV SOLN
400.0000 mg | Freq: Once | INTRAVENOUS | Status: AC
  Administered 2019-08-04: 400 mg via INTRAVENOUS
  Filled 2019-08-04: qty 200

## 2019-08-04 MED ORDER — HYDROMORPHONE HCL 1 MG/ML IJ SOLN
0.5000 mg | Freq: Once | INTRAMUSCULAR | Status: AC
  Administered 2019-08-04: 0.5 mg via INTRAVENOUS
  Filled 2019-08-04: qty 1

## 2019-08-04 MED ORDER — ATORVASTATIN CALCIUM 20 MG PO TABS
40.0000 mg | ORAL_TABLET | Freq: Every day | ORAL | Status: DC
  Administered 2019-08-04 – 2019-08-06 (×3): 40 mg via ORAL
  Filled 2019-08-04 (×3): qty 2

## 2019-08-04 MED ORDER — METRONIDAZOLE IN NACL 5-0.79 MG/ML-% IV SOLN
500.0000 mg | Freq: Once | INTRAVENOUS | Status: AC
  Administered 2019-08-04: 500 mg via INTRAVENOUS
  Filled 2019-08-04: qty 100

## 2019-08-04 MED ORDER — FENOFIBRATE 54 MG PO TABS
54.0000 mg | ORAL_TABLET | Freq: Every day | ORAL | Status: DC
  Administered 2019-08-04 – 2019-08-07 (×4): 54 mg via ORAL
  Filled 2019-08-04 (×4): qty 1

## 2019-08-04 MED ORDER — ONDANSETRON HCL 4 MG/2ML IJ SOLN
4.0000 mg | Freq: Four times a day (QID) | INTRAMUSCULAR | Status: DC | PRN
  Administered 2019-08-04 – 2019-08-06 (×4): 4 mg via INTRAVENOUS
  Filled 2019-08-04 (×4): qty 2

## 2019-08-04 MED ORDER — INSULIN ASPART 100 UNIT/ML ~~LOC~~ SOLN: 0.0000 [IU] | Freq: Every day | SUBCUTANEOUS | Status: DC

## 2019-08-04 MED ORDER — LEVETIRACETAM 500 MG PO TABS
500.0000 mg | ORAL_TABLET | Freq: Every day | ORAL | Status: DC
  Filled 2019-08-04: qty 1

## 2019-08-04 MED ORDER — TRIAMTERENE-HCTZ 37.5-25 MG PO TABS
1.0000 | ORAL_TABLET | Freq: Every day | ORAL | Status: DC
  Administered 2019-08-04 – 2019-08-07 (×4): 1 via ORAL
  Filled 2019-08-04 (×4): qty 1

## 2019-08-04 MED ORDER — ONDANSETRON HCL 4 MG PO TABS
4.0000 mg | ORAL_TABLET | Freq: Four times a day (QID) | ORAL | Status: DC | PRN
  Administered 2019-08-07: 4 mg via ORAL
  Filled 2019-08-04: qty 1

## 2019-08-04 MED ORDER — LEVETIRACETAM 500 MG PO TABS
500.0000 mg | ORAL_TABLET | Freq: Once | ORAL | Status: AC
  Administered 2019-08-04: 500 mg via ORAL
  Filled 2019-08-04: qty 1

## 2019-08-04 MED ORDER — BACLOFEN 10 MG PO TABS
10.0000 mg | ORAL_TABLET | Freq: Two times a day (BID) | ORAL | Status: DC
  Administered 2019-08-04 – 2019-08-07 (×7): 10 mg via ORAL
  Filled 2019-08-04 (×8): qty 1

## 2019-08-04 MED ORDER — ACETAMINOPHEN 325 MG PO TABS
650.0000 mg | ORAL_TABLET | Freq: Four times a day (QID) | ORAL | Status: DC | PRN
  Administered 2019-08-04 – 2019-08-07 (×2): 650 mg via ORAL
  Filled 2019-08-04 (×2): qty 2

## 2019-08-04 MED ORDER — AMITRIPTYLINE HCL 25 MG PO TABS
25.0000 mg | ORAL_TABLET | Freq: Every day | ORAL | Status: DC
  Administered 2019-08-04 – 2019-08-06 (×3): 25 mg via ORAL
  Filled 2019-08-04 (×4): qty 1

## 2019-08-04 MED ORDER — VITAMIN D 25 MCG (1000 UNIT) PO TABS
ORAL_TABLET | Freq: Every day | ORAL | Status: DC
  Administered 2019-08-04 – 2019-08-07 (×4): 1000 [IU] via ORAL
  Filled 2019-08-04 (×4): qty 1

## 2019-08-04 NOTE — H&P (Signed)
[facility] - [place] at [facility]   PATIENT NAME: Nicole Pittman    MR#:  [id number]  DATE OF BIRTH:  January 15, 1951  DATE OF ADMISSION:  08/04/2019  PRIMARY CARE PHYSICIAN: Nicole Plum, MD   REQUESTING/REFERRING PHYSICIAN: Miguel Aschoff., MD  CHIEF COMPLAINT:   Chief Complaint  Patient presents with  . Nausea    HISTORY OF PRESENT ILLNESS: Nicole Pittman  is a 68 y.o. female with a known history of COPD, diabetes type 2, hyperlipidemia, hypertension, chronic back pain who states that she has started having nausea vomiting diarrhea since Friday.  Patient was seen at [facility] on Monday and had a CT scan which showed colitis.  Patient was told that this could be viral and was given antibiotics but not to start these for the next few days.  Patient states that she continued to have vomiting and diarrhea and started continue to feel worse.  She also was feeling dizzy today comes to the emergency room were asked to admit her for colitis.  She denies any recent antibiotic use or she denies any recent travel.  Denies any fevers or chills.    PAST MEDICAL HISTORY:   Past Medical History:  Diagnosis Date  . Adhesive arachnoiditis   . Arthritis   . Back problem    spurs  . COPD (chronic obstructive pulmonary disease) (HCC)   . Diabetes (HCC)   . High cholesterol   . Hypertension   . Neck problem    bad disc    PAST SURGICAL HISTORY:  Past Surgical History:  Procedure Laterality Date  . BACK SURGERY  1978   L5  . CHOLECYSTECTOMY  1992  . KNEE SURGERY Bilateral 1993  . OVARIAN CYST REMOVAL  1985  . TONSILLECTOMY  1959  . WRIST SURGERY Bilateral 2003    SOCIAL HISTORY:  Social History   Tobacco Use  . Smoking status: Current Every Day Smoker    Packs/day: 0.50    Types: Cigarettes  . Smokeless tobacco: Never Used  Substance Use Topics  . Alcohol use: No    Alcohol/week: 0.0 standard drinks    FAMILY HISTORY:  Family History  Problem  Relation Age of Onset  . Rheum arthritis Mother   . Hypertension Mother   . Parkinson's disease Mother   . Parkinsonism Mother   . Diabetes Father   . Breast cancer Maternal Aunt   . Lupus Maternal Aunt     DRUG ALLERGIES:  Allergies  Allergen Reactions  . Fentanyl Itching and Rash    PATCHES  . Codeine Hives  . Morphine And Related Hives  . Symbicort [Budesonide-Formoterol Fumarate] Other (See Comments)    Made patient's chest "hurt" and affected vision (blurred)  . Zanaflex [Tizanidine] Palpitations and Other (See Comments)    And worsened insomina    REVIEW OF SYSTEMS:   CONSTITUTIONAL: No fever, fatigue or weakness.  EYES: No blurred or double vision.  EARS, NOSE, AND THROAT: No tinnitus or ear pain.  RESPIRATORY: No cough, shortness of breath, wheezing or hemoptysis.  CARDIOVASCULAR: No chest pain, orthopnea, edema.  GASTROINTESTINAL: Positive nausea, positive vomiting, positive diarrhea or no abdominal pain.  GENITOURINARY: No dysuria, hematuria.  ENDOCRINE: No polyuria, nocturia,  HEMATOLOGY: No anemia, easy bruising or bleeding SKIN: No rash or lesion. MUSCULOSKELETAL: No joint pain or arthritis.   NEUROLOGIC: No tingling, numbness, weakness.  PSYCHIATRY: No anxiety or depression.   MEDICATIONS AT HOME:  Prior to Admission medications   Medication Sig  Start Date End Date Taking? Authorizing Provider  amitriptyline (ELAVIL) 25 MG tablet Take 25 mg by mouth at bedtime.   Yes [provider]  aspirin 81 MG tablet Take 81 mg by mouth daily with supper.    Yes [provider]  atorvastatin (LIPITOR) 40 MG tablet Take 40 mg by mouth at bedtime.    Yes [provider]  baclofen (LIORESAL) 10 MG tablet Take 10 mg by mouth 2 (two) times daily.  01/01/16  Yes [provider]  Cholecalciferol (VITAMIN D-3 PO) Take 1 capsule by mouth daily.   Yes [provider]  fenofibrate (TRICOR) 48 MG tablet Take 48 mg by mouth at bedtime.     Yes [provider]  glipiZIDE-metformin (METAGLIP) 5-500 MG tablet Take 1 tablet by mouth 2 (two) times daily.  12/25/15  Yes [provider]  levETIRAcetam (KEPPRA) 500 MG tablet Take 500 mg by mouth daily.   Yes [provider]  metoCLOPramide (REGLAN) 10 MG tablet Take 10 mg by mouth 4 (four) times daily.   Yes [provider]  metoprolol tartrate (LOPRESSOR) 25 MG tablet Take 1 tablet (25 mg total) by mouth 2 (two) times daily. 08/04/18  Yes Jodelle GrossLawrence, Kathryn M, NP  quinapril (ACCUPRIL) 20 MG tablet Take 20 mg by mouth daily. 01/12/16  Yes [provider]  triamterene-hydrochlorothiazide (MAXZIDE-25) 37.5-25 MG tablet Take 1 tablet by mouth daily. 01/06/16  Yes [provider]  ciprofloxacin (CIPRO) 500 MG tablet Take 1 tablet (500 mg total) by mouth 2 (two) times daily for 7 days. 08/04/19 08/11/19  Terald Sleeperrifan, Matthew J, MD  EASY COMFORT LANCETS MISC 4 (four) times daily. for testing 12/14/15   [provider]  FREESTYLE LITE test strip 4 (four) times daily. for testing 12/14/15   [provider]  metroNIDAZOLE (FLAGYL) 500 MG tablet Take 1 tablet (500 mg total) by mouth 3 (three) times daily for 7 days. 08/04/19 08/11/19  Terald Sleeperrifan, Matthew J, MD  nitroGLYCERIN (NITROSTAT) 0.4 MG SL tablet Place 1 tablet (0.4 mg total) under the tongue every 5 (five) minutes as needed for chest pain. MAX 3 doses in 15 minutes 08/04/18 08/02/19  Jodelle GrossLawrence, Kathryn M, NP  ondansetron (ZOFRAN) 4 MG tablet Take 1 tablet (4 mg total) by mouth every 8 (eight) hours as needed for up to 12 doses for nausea or vomiting. 08/02/19   Terald Sleeperrifan, Matthew J, MD  oxyCODONE (OXY IR/ROXICODONE) 5 MG immediate release tablet Take 5 mg by mouth 2 (two) times daily.  01/02/16   [provider]  OXYCONTIN 20 MG 12 hr tablet Take 20 mg by mouth daily with supper.  12/19/15   [provider]  PROAIR HFA 108 (90 Base) MCG/ACT inhaler Inhale 2 puffs into the lungs 2 (two) times  daily.  01/03/16   [provider]  zolpidem (AMBIEN) 10 MG tablet Take 10 mg by mouth daily. 12/29/15   [provider]      PHYSICAL EXAMINATION:   VITAL SIGNS: Blood pressure (!) 188/70, pulse 99, temperature 98.8 F (37.1 C), temperature source Oral, resp. rate 20, height 5\' 3"  (1.6 m), weight 58.5 kg, SpO2 99 %.  GENERAL:  68 y.o.-year-old patient lying in the bed with no acute distress.  EYES: Pupils equal, round, reactive to light and accommodation. No scleral icterus. Extraocular muscles intact.  HEENT: Head atraumatic, normocephalic. Oropharynx and nasopharynx clear.  NECK:  Supple, no jugular venous distention. No thyroid enlargement, no tenderness.  LUNGS: Normal breath sounds  bilaterally, no wheezing, rales,rhonchi or crepitation. No use of accessory muscles of respiration.  CARDIOVASCULAR: S1, S2 normal. No murmurs, rubs, or gallops.  ABDOMEN: Soft, nontender, nondistended. Bowel sounds present. No organomegaly or mass.  EXTREMITIES: No pedal edema, cyanosis, or clubbing.  NEUROLOGIC: Cranial nerves II through XII are intact. Muscle strength 5/5 in all extremities. Sensation intact. Gait not checked.  PSYCHIATRIC: The patient is alert and oriented x 3.  SKIN: No obvious rash, lesion, or ulcer.   LABORATORY PANEL:   CBC Recent Labs  Lab 08/02/19 0956 08/04/19 0819  WBC 12.1* 15.1*  HGB 13.1 13.7  HCT 39.3 40.8  PLT 301 378  MCV 89.9 88.3  MCH 30.0 29.7  MCHC 33.3 33.6  RDW 12.7 12.6  LYMPHSABS 2.3 2.8  MONOABS 0.7 1.1*  EOSABS 0.2 0.1  BASOSABS 0.0 0.1   ------------------------------------------------------------------------------------------------------------------  Chemistries  Recent Labs  Lab 08/02/19 0956 08/04/19 0819  NA 132* 136  K 3.4* 3.5  CL 96* 101  CO2 22 17*  GLUCOSE 159* 135*  BUN 21 21  CREATININE 0.89 0.93  CALCIUM 9.7 9.4  AST 18 18  ALT 30 25  ALKPHOS 75 78  BILITOT 0.7 1.2    ------------------------------------------------------------------------------------------------------------------ estimated creatinine clearance is 47.2 mL/min (by C-G formula based on SCr of 0.93 mg/dL). ------------------------------------------------------------------------------------------------------------------ No results for input(s): TSH, T4TOTAL, T3FREE, THYROIDAB in the last 72 hours.  Invalid input(s): FREET3   Coagulation profile No results for input(s): INR, PROTIME in the last 168 hours. ------------------------------------------------------------------------------------------------------------------- No results for input(s): DDIMER in the last 72 hours. -------------------------------------------------------------------------------------------------------------------  Cardiac Enzymes No results for input(s): CKMB, TROPONINI, MYOGLOBIN in the last 168 hours.  Invalid input(s): CK ------------------------------------------------------------------------------------------------------------------ Invalid input(s): POCBNP  ---------------------------------------------------------------------------------------------------------------  Urinalysis    Component Value Date/Time   COLORURINE YELLOW 08/02/2019 0958   APPEARANCEUR CLEAR 08/02/2019 0958   LABSPEC 1.016 08/02/2019 0958   PHURINE 5.0 08/02/2019 0958   GLUCOSEU NEGATIVE 08/02/2019 0958   HGBUR SMALL (A) 08/02/2019 0958   BILIRUBINUR NEGATIVE 08/02/2019 0958   KETONESUR 20 (A) 08/02/2019 0958   PROTEINUR 30 (A) 08/02/2019 0958   NITRITE NEGATIVE 08/02/2019 0958   LEUKOCYTESUR SMALL (A) 08/02/2019 0958     RADIOLOGY: Ct Abdomen Pelvis W Contrast  Result Date: 08/02/2019 CLINICAL DATA:  Abdominal pain.  Evaluate for diverticulitis. EXAM: CT ABDOMEN AND PELVIS WITH CONTRAST TECHNIQUE: Multidetector CT imaging of the abdomen and pelvis was performed using the standard protocol following bolus administration of  intravenous contrast. CONTRAST:  100mL OMNIPAQUE IOHEXOL 300 MG/ML  SOLN COMPARISON:  None. FINDINGS: Lower chest: No acute abnormality. Hepatobiliary: Hepatic steatosis. Previous cholecystectomy. No biliary dilatation. Pancreas: Unremarkable. No pancreatic ductal dilatation or surrounding inflammatory changes. Spleen: Normal in size without focal abnormality. Adrenals/Urinary Tract: Normal appearance of the adrenal glands. No kidney stone or hydronephrosis. Urinary bladder appears normal. Stomach/Bowel: Small hiatal hernia. Normal appearance of the small bowel loops. No small bowel wall thickening, inflammation or distension. Mild diffuse colonic wall thickening is identified. No significant diverticular disease identified. No acute diverticulitis noted. Vascular/Lymphatic: Aortic atherosclerosis. No abdominopelvic adenopathy. Reproductive: The uterus and adnexal structures are unremarkable. Other: No free fluid, or fluid collections. Musculoskeletal: Spondylosis identified. No aggressive lytic or sclerotic bone lesions. IMPRESSION: 1. Mild diffuse colonic wall thickening concerning for uncomplicated colitis. No findings to suggest diverticulitis. 2. Small hiatal hernia. 3.  Aortic Atherosclerosis (ICD10-I70.0). Electronically Signed   By: Signa Kellaylor  Stroud M.D.   On: 08/02/2019 12:13    EKG: Orders placed or performed during the  hospital encounter of 08/04/19  . EKG 12-Lead  . EKG 12-Lead    IMPRESSION AND PLAN: Patient is 68 year old white female with COPD presenting with diarrhea noted to have colitis  1.  Acute colitis we will check stool for C. difficile and stool PCR Continue IV Cipro and Flagyl IV fluids If no improvements will consider GI consult  2.  Accelerated hypertension I will continue her home medications use IV hydralazine PRN  3.  COPD without exasperation use PRN nebs continue inhalers as doing at home  4.  Diabetes type 2 in light of her diarrhea I will discontinue metformin.     5.  Hyperlipidemia continue Lipitor and fenofibrate   6.  Nicotine abuse smoking cessation provided 4 minutes spent strongly recommend that she stop smoking      All the records are reviewed and case discussed with ED provider. Management plans discussed with the patient, family and they are in agreement.  CODE STATUS: Code Status History    Date Active Date Inactive Code Status Order ID Comments User Context   07/08/2018 2216 07/09/2018 2107 Full Code [id number]  Jani Gravel, MD ED   Advance Care Planning Activity       TOTAL TIME TAKING CARE OF THIS PATIENT: 18minutes.    Dustin Flock M.D on 08/04/2019 at 10:38 AM  Between 7am to 6pm - Pager - [phone]  After 6pm go to [email or web address] - password [email or web address] [facility]  [phone]  CC: Primary care physician; Benito Mccreedy, MD

## 2019-08-04 NOTE — ED Notes (Signed)
Pt visibly shaking and tense. MD notified.

## 2019-08-04 NOTE — ED Provider Notes (Signed)
[facility] Emergency Department Provider Note  ____________________________________________   First MD Initiated Contact with Patient 08/04/19 0815     (approximate)  I have reviewed the triage vital signs and the nursing notes.  History  Chief Complaint Nausea    HPI Nicole Pittman is a 68 y.o. female with history of COPD, diabetes, paroxysmal A. fib, chronic pain, recent diagnosis of colitis on 9/7 (at [facility] ED) who presents to the emergency department for lightheadedness, nausea, inability to tolerate PO, continued diarrhea. She has been unable to take any of her home medications since Sunday.  Symptoms have now been ongoing for 6 or 7 days.  Over the last few days she has had 4-5 episodes of watery diarrhea per day.  Non-bloody. She denies any associated abdominal pain.  She was seen in the ER on 9/7, where CT revealed uncomplicated colitis.  Plan at discharge was for symptomatic care for the next few days, and if no improvement, initiation of antibiotics.  As such, she was planning to initiate antibiotics today since she has not improved, but felt lightheaded and poorly and therefore called EMS. She has therefore not taking any antibiotics for her colitis.  She also reports a history of chronic pain, for which she takes OxyContin nightly, and oral oxycodone twice daily.  In addition to her other home medications, she has been unable to keep this down due to her symptoms.          Past Medical Hx Past Medical History:  Diagnosis Date  . Adhesive arachnoiditis   . Arthritis   . Back problem    spurs  . COPD (chronic obstructive pulmonary disease) (HCC)   . Diabetes (HCC)   . High cholesterol   . Hypertension   . Neck problem    bad disc    Problem List Patient Active Problem List   Diagnosis Date Noted  . Paroxysmal atrial fibrillation (HCC)   . Chest pain, precordial 07/08/2018  . Tremor of face and hands 01/21/2016  . HTN  (hypertension) 01/21/2016  . HLD (hyperlipidemia) 01/21/2016  . Diabetes (HCC) 01/21/2016  . COPD (chronic obstructive pulmonary disease) (HCC) 01/21/2016    Past Surgical Hx Past Surgical History:  Procedure Laterality Date  . BACK SURGERY  1978   L5  . CHOLECYSTECTOMY  1992  . KNEE SURGERY Bilateral 1993  . OVARIAN CYST REMOVAL  1985  . TONSILLECTOMY  1959  . WRIST SURGERY Bilateral 2003    Medications Prior to Admission medications   Medication Sig Start Date End Date Taking? Authorizing Provider  amitriptyline (ELAVIL) 25 MG tablet Take 25 mg by mouth at bedtime.    [provider]  aspirin 81 MG tablet Take 81 mg by mouth daily with supper.     [provider]  atorvastatin (LIPITOR) 40 MG tablet Take 40 mg by mouth at bedtime.     [provider]  baclofen (LIORESAL) 10 MG tablet Take 10 mg by mouth 2 (two) times daily.  01/01/16   [provider]  Cholecalciferol (VITAMIN D-3 PO) Take 1 capsule by mouth daily.    [provider]  ciprofloxacin (CIPRO) 500 MG tablet Take 1 tablet (500 mg total) by mouth 2 (two) times daily for 7 days. 08/04/19 08/11/19  Terald Sleeperrifan, Matthew J, MD  EASY COMFORT LANCETS MISC 4 (four) times daily. for testing 12/14/15   [provider]  fenofibrate (TRICOR) 48 MG tablet Take 48 mg by mouth at bedtime.  [provider]  FREESTYLE LITE test strip 4 (four) times daily. for testing 12/14/15   [provider]  glipiZIDE-metformin (METAGLIP) 5-500 MG tablet Take 1 tablet by mouth 2 (two) times daily.  12/25/15   [provider]  levETIRAcetam (KEPPRA) 500 MG tablet Take 500 mg by mouth daily.    [provider]  metoCLOPramide (REGLAN) 10 MG tablet Take 10 mg by mouth 4 (four) times daily.    [provider]  metoprolol tartrate (LOPRESSOR) 25 MG tablet Take 1 tablet (25 mg total) by mouth 2 (two) times daily. 08/04/18   Jodelle Gross, NP  metroNIDAZOLE  (FLAGYL) 500 MG tablet Take 1 tablet (500 mg total) by mouth 3 (three) times daily for 7 days. 08/04/19 08/11/19  Terald Sleeper, MD  nitroGLYCERIN (NITROSTAT) 0.4 MG SL tablet Place 1 tablet (0.4 mg total) under the tongue every 5 (five) minutes as needed for chest pain. MAX 3 doses in 15 minutes 08/04/18 08/02/19  Jodelle Gross, NP  ondansetron (ZOFRAN) 4 MG tablet Take 1 tablet (4 mg total) by mouth every 8 (eight) hours as needed for up to 12 doses for nausea or vomiting. 08/02/19   Terald Sleeper, MD  oxyCODONE (OXY IR/ROXICODONE) 5 MG immediate release tablet Take 5 mg by mouth 2 (two) times daily.  01/02/16   [provider]  OXYCONTIN 20 MG 12 hr tablet Take 20 mg by mouth daily with supper.  12/19/15   [provider]  PROAIR HFA 108 (90 Base) MCG/ACT inhaler Inhale 2 puffs into the lungs 2 (two) times daily.  01/03/16   [provider]  quinapril (ACCUPRIL) 20 MG tablet Take 20 mg by mouth daily. 01/12/16   [provider]  triamterene-hydrochlorothiazide (MAXZIDE-25) 37.5-25 MG tablet Take 1 tablet by mouth daily. 01/06/16   [provider]  zolpidem (AMBIEN) 10 MG tablet Take 10 mg by mouth daily. 12/29/15   [provider]    Allergies Fentanyl, Codeine, Morphine and related, Symbicort [budesonide-formoterol fumarate], and Zanaflex [tizanidine]  Family Hx Family History  Problem Relation Age of Onset  . Rheum arthritis Mother   . Hypertension Mother   . Parkinson's disease Mother   . Parkinsonism Mother   . Diabetes Father   . Breast cancer Maternal Aunt   . Lupus Maternal Aunt     Social Hx Social History   Tobacco Use  . Smoking status: Current Every Day Smoker    Packs/day: 0.50    Types: Cigarettes  . Smokeless tobacco: Never Used  Substance Use Topics  . Alcohol use: No    Alcohol/week: 0.0 standard drinks  . Drug use: No     Review of Systems  Constitutional: Negative for fever. Negative for chills. +  lightheaded Eyes: Negative for visual changes. ENT: Negative for sore throat. Cardiovascular: Negative for chest pain. Respiratory: Negative for shortness of breath. Gastrointestinal: + nausea, diarrhea Genitourinary: Negative for dysuria. Musculoskeletal: Negative for leg swelling. Skin: Negative for rash. Neurological: Negative for for headaches.   Physical Exam  Vital Signs: ED Triage Vitals  Enc Vitals Group     BP 08/04/19 0813 (!) 172/87     Pulse Rate 08/04/19 0813 (!) 116     Resp 08/04/19 0813 20     Temp 08/04/19 0813 98.8 F (37.1 C)     Temp Source 08/04/19 0813 Oral     SpO2 08/04/19 0813 98 %     Weight 08/04/19 0809 129 lb (58.5 kg)  Height 08/04/19 0809 5\' 3"  (1.6 m)     Head Circumference --      Peak Flow --      Pain Score 08/04/19 0808 7     Pain Loc --      Pain Edu? --      Excl. in [place]? --     Constitutional: Alert and oriented.  Appears fatigued. Eyes: Conjunctivae clear. Sclera anicteric. Head: Normocephalic. Atraumatic. Nose: No congestion. No rhinorrhea. Mouth/Throat: Mucous membranes are dry.  Neck: No stridor.   Cardiovascular: Tachycardic, regular rhythm. No murmurs. Extremities well perfused. Respiratory: Normal respiratory effort.  Lungs CTAB. Gastrointestinal: Soft and non-tender. No distention.  Musculoskeletal: No lower extremity edema. Neurologic:  Normal speech and language. No gross focal neurologic deficits are appreciated.  Skin: Skin is warm, dry and intact. No rash noted. Psychiatric: Mood and affect are appropriate for situation.  EKG  Personally reviewed.   Rate: 117 Rhythm: Sinus Axis: Normal Intervals: QTC 479 ms Sinus tachycardia No acute ischemic changes No STEMI    Radiology  CT from 08/02/2019: IMPRESSION: 1. Mild diffuse colonic wall thickening concerning for uncomplicated colitis. No findings to suggest diverticulitis. 2. Small hiatal hernia. 3.  Aortic Atherosclerosis  (ICD10-I70.0).   Procedures  Procedure(s) performed (including critical care):  Procedures   Initial Impression / Assessment and Plan / ED Course  68 y.o. female with history of COPD, diabetes, paroxysmal A. fib, recent diagnosis of colitis on 9/7 (at [facility] ED) who presents to the emergency department for lightheadedness, nausea, inability to tolerate PO, continued diarrhea  On exam she is tachycardic, dry mucous membranes, appears dehydrated.  Ddx: worsening dehydration due to colitis, perhaps some component of opiate withdrawal given she has been unable to keep down her oral oxycodone since onset of her symptoms  Plan: we will obtain labs, initiate Cipro/Flagyl, give fluids and symptom control, reassess. Of note, she was COVID negative on 9/7.   Work up reveals increasing leukocytosis, decreased bicarb likely 2/2 GI losses.  She was able to keep down her oral Keppra and metoprolol pill, but continues to feel nauseous and unable to keep down any further PO.  Given this, will plan for admission for further hydration, symptom control so she may take her home oral medications, and antibiotics.  Discussed with hospitalist for admission.   Final Clinical Impression(s) / ED Diagnosis  Final diagnoses:  Colitis  Dehydration       Note:  This document was prepared using Dragon voice recognition software and may include unintentional dictation errors.   Lilia Pro., MD 08/04/19 [phone]

## 2019-08-04 NOTE — ED Triage Notes (Signed)
Pt arrived from home via [facility]. States that she was diagnosed with colitis two days ago. States that she has been unable to eat or drink anything including medications starting Sunday. Denies stomach pain.

## 2019-08-04 NOTE — Progress Notes (Signed)
Advanced care plan.  Purpose of the Encounter: CODE STATUS  Parties in Attendance: Patient herself  Patient's Decision Capacity: Intact  Subjective/Patient's story: Nicole Pittman is a 68 y.o. female with history of COPD, diabetes, paroxysmal A. fib, chronic pain, recent diagnosis of colitis on 9/7 (at [facility] ED) who presents to the emergency department for lightheadedness, nausea, inability to tolerate PO, continued diarrhea. She has been unable to take any of her home medications since Sunday.  Symptoms have now been ongoing for 6 or 7 days.  Over the last few days she has had 4-5 episodes of watery diarrhea per day.   Objective/Medical story I discussed with the patient regarding her desires for cardiac and pulmonary resuscitation   Goals of care determination:  Patient states she would like everything to be done wants to be full code   CODE STATUS: Full code   Time spent discussing advanced care planning: 16 minutes

## 2019-08-04 NOTE — ED Notes (Signed)
Pt states that she began to have nausea and diarrhea Friday. States that she went to the [place] ED Monday, and she was diagnosed with colitis. Has been unable to eat or drink anything including medications since Sunday. States that she came back to the ED this morning due to dizziness and feeling like she was going to pass out.

## 2019-08-05 LAB — CBC
HCT: 33.1 % — ABNORMAL LOW (ref 36.0–46.0)
Hemoglobin: 11.3 g/dL — ABNORMAL LOW (ref 12.0–15.0)
MCH: 30 pg (ref 26.0–34.0)
MCHC: 34.1 g/dL (ref 30.0–36.0)
MCV: 87.8 fL (ref 80.0–100.0)
Platelets: 245 10*3/uL (ref 150–400)
RBC: 3.77 MIL/uL — ABNORMAL LOW (ref 3.87–5.11)
RDW: 12.6 % (ref 11.5–15.5)
WBC: 10 10*3/uL (ref 4.0–10.5)
nRBC: 0 % (ref 0.0–0.2)

## 2019-08-05 LAB — GLUCOSE, CAPILLARY
Glucose-Capillary: 80 mg/dL (ref 70–99)
Glucose-Capillary: 151 mg/dL — ABNORMAL HIGH (ref 70–99)
Glucose-Capillary: 104 mg/dL — ABNORMAL HIGH (ref 70–99)
Glucose-Capillary: 112 mg/dL — ABNORMAL HIGH (ref 70–99)

## 2019-08-05 LAB — BASIC METABOLIC PANEL
Anion gap: 9 (ref 5–15)
BUN: 11 mg/dL (ref 8–23)
CO2: 25 mmol/L (ref 22–32)
Calcium: 8.4 mg/dL — ABNORMAL LOW (ref 8.9–10.3)
Chloride: 103 mmol/L (ref 98–111)
Creatinine, Ser: 0.77 mg/dL (ref 0.44–1.00)
GFR calc Af Amer: >60 mL/min (ref >60)
GFR calc non Af Amer: >60 mL/min (ref >60)
Glucose, Bld: 101 mg/dL — ABNORMAL HIGH (ref 70–99)
Potassium: 3.1 mmol/L — ABNORMAL LOW (ref 3.5–5.1)
Sodium: 137 mmol/L (ref 135–145)

## 2019-08-05 LAB — GASTROINTESTINAL PANEL BY PCR, STOOL (REPLACES STOOL CULTURE)

## 2019-08-05 LAB — C DIFFICILE QUICK SCREEN W PCR REFLEX
C Diff antigen: NEGATIVE
C Diff interpretation: NOT DETECTED
C Diff toxin: NEGATIVE

## 2019-08-05 MED ORDER — CARBAMIDE PEROXIDE 6.5 % OT SOLN
5.0000 [drp] | Freq: Two times a day (BID) | OTIC | Status: AC
  Administered 2019-08-05 – 2019-08-06 (×4): 5 [drp] via OTIC
  Filled 2019-08-05: qty 15

## 2019-08-05 MED ORDER — ALUM & MAG HYDROXIDE-SIMETH 200-200-20 MG/5ML PO SUSP
30.0000 mL | ORAL | Status: DC | PRN
  Administered 2019-08-05: 30 mL via ORAL
  Filled 2019-08-05: qty 30

## 2019-08-05 MED ORDER — POTASSIUM CHLORIDE CRYS ER 20 MEQ PO TBCR
40.0000 meq | EXTENDED_RELEASE_TABLET | Freq: Once | ORAL | Status: AC
  Administered 2019-08-05: 40 meq via ORAL
  Filled 2019-08-05: qty 2

## 2019-08-05 MED ORDER — ALBUTEROL SULFATE (2.5 MG/3ML) 0.083% IN NEBU
3.0000 mL | INHALATION_SOLUTION | Freq: Two times a day (BID) | RESPIRATORY_TRACT | Status: DC
  Administered 2019-08-06 (×2): 3 mL via RESPIRATORY_TRACT
  Filled 2019-08-05 (×4): qty 3

## 2019-08-05 MED ORDER — LEVETIRACETAM 500 MG PO TABS
500.0000 mg | ORAL_TABLET | Freq: Every day | ORAL | Status: DC
  Administered 2019-08-05 – 2019-08-06 (×2): 500 mg via ORAL
  Filled 2019-08-05 (×2): qty 1

## 2019-08-05 NOTE — Progress Notes (Signed)
[facility] - [place] at [facility]                                                                                                                                                                                  Patient Demographics   Nicole Pittman, is a 68 y.o. female, DOB - Jun 29, 1951, ZOX:[id number]  Admit date - 08/04/2019   Admitting Physician Auburn BilberryShreyang Breckyn Ticas, MD  Outpatient Primary MD for the patient is Osei-Bonsu, Greggory StallionGeorge, MD   LOS - 1  Subjective: Patient feeling better diarrhea is improved    Review of Systems:   CONSTITUTIONAL: No documented fever. No fatigue, weakness. No weight gain, no weight loss.  EYES: No blurry or double vision.  ENT: No tinnitus. No postnasal drip. No redness of the oropharynx.  RESPIRATORY: No cough, no wheeze, no hemoptysis. No dyspnea.  CARDIOVASCULAR: No chest pain. No orthopnea. No palpitations. No syncope.  GASTROINTESTINAL: No nausea, no vomiting or positive diarrhea. No abdominal pain. No melena or hematochezia.  GENITOURINARY: No dysuria or hematuria.  ENDOCRINE: No polyuria or nocturia. No heat or cold intolerance.  HEMATOLOGY: No anemia. No bruising. No bleeding.  INTEGUMENTARY: No rashes. No lesions.  MUSCULOSKELETAL: No arthritis. No swelling. No gout.  NEUROLOGIC: No numbness, tingling, or ataxia. No seizure-type activity.  PSYCHIATRIC: No anxiety. No insomnia. No ADD.    Vitals:   Vitals:   08/05/19 0434 08/05/19 0900 08/05/19 0912 08/05/19 1147  BP: (!) 135/51  (!) 168/64 (!) 142/56  Pulse: 80  73 74  Resp: 20   18  Temp: 98.2 F (36.8 C)   98.9 F (37.2 C)  TempSrc: Oral   Oral  SpO2: 96% 95%  96%  Weight:      Height:        Wt Readings from Last 3 Encounters:  08/04/19 58.5 kg  08/02/19 58.5 kg  09/17/18 57.1 kg     Intake/Output Summary (Last 24 hours) at 08/05/2019 1300 Last data filed at 08/05/2019 1209 Gross per 24 hour  Intake 1830.4 ml  Output 2500 ml  Net -669.6 ml    Physical  Exam:   GENERAL: Pleasant-appearing in no apparent distress.  HEAD, EYES, EARS, NOSE AND THROAT: Atraumatic, normocephalic. Extraocular muscles are intact. Pupils equal and reactive to light. Sclerae anicteric. No conjunctival injection. No oro-pharyngeal erythema.  NECK: Supple. There is no jugular venous distention. No bruits, no lymphadenopathy, no thyromegaly.  HEART: Regular rate and rhythm,. No murmurs, no rubs, no clicks.  LUNGS: Clear to auscultation bilaterally. No rales or rhonchi. No wheezes.  ABDOMEN: Soft, flat, nontender, nondistended. Has good bowel sounds. No hepatosplenomegaly appreciated.  EXTREMITIES: No evidence of any cyanosis, clubbing,  or peripheral edema.  +2 pedal and radial pulses bilaterally.  NEUROLOGIC: The patient is alert, awake, and oriented x3 with no focal motor or sensory deficits appreciated bilaterally.  SKIN: Moist and warm with no rashes appreciated.  Psych: Not anxious, depressed LN: No inguinal LN enlargement    Antibiotics   Anti-infectives (From admission, onward)   Start     Dose/Rate Route Frequency Ordered Stop   08/05/19 2200  ciprofloxacin (CIPRO) IVPB 400 mg     400 mg 200 mL/hr over 60 Minutes Intravenous Every 12 hours 08/04/19 1059     08/04/19 1100  metroNIDAZOLE (FLAGYL) IVPB 500 mg     500 mg 100 mL/hr over 60 Minutes Intravenous Every 8 hours 08/04/19 1059     08/04/19 0830  ciprofloxacin (CIPRO) IVPB 400 mg     400 mg 200 mL/hr over 60 Minutes Intravenous  Once 08/04/19 0815 08/04/19 1143   08/04/19 0830  metroNIDAZOLE (FLAGYL) IVPB 500 mg     500 mg 100 mL/hr over 60 Minutes Intravenous  Once 08/04/19 0815 08/04/19 1016      Medications   Scheduled Meds: . albuterol  3 mL Inhalation BID  . amitriptyline  25 mg Oral QHS  . aspirin EC  81 mg Oral Q supper  . atorvastatin  40 mg Oral QHS  . baclofen  10 mg Oral BID  . carbamide peroxide  5 drop Both EARS BID  . cholecalciferol   Oral Daily  . enoxaparin (LOVENOX)  injection  40 mg Subcutaneous Q24H  . fenofibrate  54 mg Oral Daily  . insulin aspart  0-5 Units Subcutaneous QHS  . insulin aspart  0-9 Units Subcutaneous TID WC  . levETIRAcetam  500 mg Oral QAC supper  . metoCLOPramide  10 mg Oral QID  . metoprolol tartrate  25 mg Oral BID  . oxyCODONE  5 mg Oral BID  . oxyCODONE  20 mg Oral Q supper  . quinapril  20 mg Oral Daily  . sodium chloride flush  3 mL Intravenous Q12H  . triamterene-hydrochlorothiazide  1 tablet Oral Daily  . zolpidem  5 mg Oral QHS   Continuous Infusions: . sodium chloride    . sodium chloride Stopped (08/05/19 1053)  . ciprofloxacin    . lactated ringers 100 mL/hr at 08/05/19 1100  . metronidazole 500 mg (08/05/19 0922)   PRN Meds:.sodium chloride, sodium chloride, acetaminophen **OR** acetaminophen, alum & mag hydroxide-simeth, hydrALAZINE, ondansetron **OR** ondansetron (ZOFRAN) IV, sodium chloride flush, zolpidem   Data Review:   Micro Results Recent Results (from the past 240 hour(s))  SARS Coronavirus 2 [facility] order, Performed in [facility] hospital lab) Nasopharyngeal Nasopharyngeal Swab     Status: None   Collection Time: 08/02/19  9:59 AM   Specimen: Nasopharyngeal Swab  Result Value Ref Range Status   SARS Coronavirus 2 NEGATIVE NEGATIVE Final    Comment: (NOTE) If result is NEGATIVE SARS-CoV-2 target nucleic acids are NOT DETECTED. The SARS-CoV-2 RNA is generally detectable in upper and lower  respiratory specimens during the acute phase of infection. The lowest  concentration of SARS-CoV-2 viral copies this assay can detect is 250  copies / mL. A negative result does not preclude SARS-CoV-2 infection  and should not be used as the sole basis for treatment or other  patient management decisions.  A negative result may occur with  improper specimen collection / handling, submission of specimen other  than nasopharyngeal swab, presence of viral mutation(s) within the  areas targeted  by this  assay, and inadequate number of viral copies  (<250 copies / mL). A negative result must be combined with clinical  observations, patient history, and epidemiological information. If result is POSITIVE SARS-CoV-2 target nucleic acids are DETECTED. The SARS-CoV-2 RNA is generally detectable in upper and lower  respiratory specimens dur ing the acute phase of infection.  Positive  results are indicative of active infection with SARS-CoV-2.  Clinical  correlation with patient history and other diagnostic information is  necessary to determine patient infection status.  Positive results do  not rule out bacterial infection or co-infection with other viruses. If result is PRESUMPTIVE POSTIVE SARS-CoV-2 nucleic acids MAY BE PRESENT.   A presumptive positive result was obtained on the submitted specimen  and confirmed on repeat testing.  While 2019 novel coronavirus  (SARS-CoV-2) nucleic acids may be present in the submitted sample  additional confirmatory testing may be necessary for epidemiological  and / or clinical management purposes  to differentiate between  SARS-CoV-2 and other Sarbecovirus currently known to infect humans.  If clinically indicated additional testing with an alternate test  methodology [phone]) is advised. The SARS-CoV-2 RNA is generally  detectable in upper and lower respiratory sp ecimens during the acute  phase of infection. The expected result is Negative. Fact Sheet for Patients:  StrictlyIdeas.no Fact Sheet for Healthcare Providers: BankingDealers.co.za This test is not yet approved or cleared by the [place] FDA and has been authorized for detection and/or diagnosis of SARS-CoV-2 by FDA under an Emergency Use Authorization (EUA).  This EUA will remain in effect (meaning this test can be used) for the duration of the COVID-19 declaration under Section 564(b)(1) of the Act, 21 U.S.C. section 360bbb-3(b)(1),  unless the authorization is terminated or revoked sooner. Performed at [facility], [place] [address] [place], [place] [address]   SARS CORONAVIRUS 2 (TAT 6-24 HRS) Nasopharyngeal Nasopharyngeal Swab     Status: None   Collection Time: 08/04/19 11:44 AM   Specimen: Nasopharyngeal Swab  Result Value Ref Range Status   SARS Coronavirus 2 NEGATIVE NEGATIVE Final    Comment: (NOTE) SARS-CoV-2 target nucleic acids are NOT DETECTED. The SARS-CoV-2 RNA is generally detectable in upper and lower respiratory specimens during the acute phase of infection. Negative results do not preclude SARS-CoV-2 infection, do not rule out co-infections with other pathogens, and should not be used as the sole basis for treatment or other patient management decisions. Negative results must be combined with clinical observations, patient history, and epidemiological information. The expected result is Negative. Fact Sheet for Patients: SugarRoll.be Fact Sheet for Healthcare Providers: https://www.woods-mathews.com/ This test is not yet approved or cleared by the [place] FDA and  has been authorized for detection and/or diagnosis of SARS-CoV-2 by FDA under an Emergency Use Authorization (EUA). This EUA will remain  in effect (meaning this test can be used) for the duration of the COVID-19 declaration under Section 56 4(b)(1) of the Act, 21 U.S.C. section 360bbb-3(b)(1), unless the authorization is terminated or revoked sooner. Performed at [facility], [place] [address], [place], [place] [address]     Radiology Reports Ct Abdomen Pelvis W Contrast  Result Date: 08/02/2019 CLINICAL DATA:  Abdominal pain.  Evaluate for diverticulitis. EXAM: CT ABDOMEN AND PELVIS WITH CONTRAST TECHNIQUE: Multidetector CT imaging of the abdomen and pelvis was performed using the standard protocol following bolus administration of intravenous contrast.  CONTRAST:  192mL OMNIPAQUE IOHEXOL 300 MG/ML  SOLN COMPARISON:  None. FINDINGS: Lower  chest: No acute abnormality. Hepatobiliary: Hepatic steatosis. Previous cholecystectomy. No biliary dilatation. Pancreas: Unremarkable. No pancreatic ductal dilatation or surrounding inflammatory changes. Spleen: Normal in size without focal abnormality. Adrenals/Urinary Tract: Normal appearance of the adrenal glands. No kidney stone or hydronephrosis. Urinary bladder appears normal. Stomach/Bowel: Small hiatal hernia. Normal appearance of the small bowel loops. No small bowel wall thickening, inflammation or distension. Mild diffuse colonic wall thickening is identified. No significant diverticular disease identified. No acute diverticulitis noted. Vascular/Lymphatic: Aortic atherosclerosis. No abdominopelvic adenopathy. Reproductive: The uterus and adnexal structures are unremarkable. Other: No free fluid, or fluid collections. Musculoskeletal: Spondylosis identified. No aggressive lytic or sclerotic bone lesions. IMPRESSION: 1. Mild diffuse colonic wall thickening concerning for uncomplicated colitis. No findings to suggest diverticulitis. 2. Small hiatal hernia. 3.  Aortic Atherosclerosis (ICD10-I70.0). Electronically Signed   By: Signa Kell M.D.   On: 08/02/2019 12:13     CBC Recent Labs  Lab 08/02/19 0956 08/04/19 0819 08/05/19 0449  WBC 12.1* 15.1* 10.0  HGB 13.1 13.7 11.3*  HCT 39.3 40.8 33.1*  PLT 301 378 245  MCV 89.9 88.3 87.8  MCH 30.0 29.7 30.0  MCHC 33.3 33.6 34.1  RDW 12.7 12.6 12.6  LYMPHSABS 2.3 2.8  --   MONOABS 0.7 1.1*  --   EOSABS 0.2 0.1  --   BASOSABS 0.0 0.1  --     Chemistries  Recent Labs  Lab 08/02/19 0956 08/04/19 0819 08/05/19 0449  NA 132* 136 137  K 3.4* 3.5 3.1*  CL 96* 101 103  CO2 22 17* 25  GLUCOSE 159* 135* 101*  BUN 21 21 11   CREATININE 0.89 0.93 0.77  CALCIUM 9.7 9.4 8.4*  AST 18 18  --   ALT 30 25  --   ALKPHOS 75 78  --   BILITOT 0.7 1.2  --     ------------------------------------------------------------------------------------------------------------------ estimated creatinine clearance is 54.9 mL/min (by C-G formula based on SCr of 0.77 mg/dL). ------------------------------------------------------------------------------------------------------------------ No results for input(s): HGBA1C in the last 72 hours. ------------------------------------------------------------------------------------------------------------------ No results for input(s): CHOL, HDL, LDLCALC, TRIG, CHOLHDL, LDLDIRECT in the last 72 hours. ------------------------------------------------------------------------------------------------------------------ No results for input(s): TSH, T4TOTAL, T3FREE, THYROIDAB in the last 72 hours.  Invalid input(s): FREET3 ------------------------------------------------------------------------------------------------------------------ No results for input(s): VITAMINB12, FOLATE, FERRITIN, TIBC, IRON, RETICCTPCT in the last 72 hours.  Coagulation profile No results for input(s): INR, PROTIME in the last 168 hours.  No results for input(s): DDIMER in the last 72 hours.  Cardiac Enzymes No results for input(s): CKMB, TROPONINI, MYOGLOBIN in the last 168 hours.  Invalid input(s): CK ------------------------------------------------------------------------------------------------------------------ Invalid input(s): POCBNP    Assessment & Plan   IMPRESSION AND PLAN: Patient is 68 year old white female with COPD presenting with diarrhea noted to have colitis  1.  Acute colitis we will check stool for C. difficile and stool PCR Continue IV Cipro and Flagyl Continue IV fluids  2.  Accelerated hypertension continue home meds with PRN IV hydralazine  3.  COPD without exasperation use PRN nebs continue inhalers as doing at home  4.  Diabetes type 2 i continue sliding-scale  5.  Hyperlipidemia continue Lipitor  and fenofibrate  6.    Hypokalemia replace K+  7.  Misc: lovenox     Code Status Orders  (From admission, onward)         Start     Ordered   08/04/19 1429  Full code  Continuous     08/04/19 1428        Code Status  History    Date Active Date Inactive Code Status Order ID Comments User Context   07/08/2018 2216 07/09/2018 2107 Full Code [id number]  Pearson GrippeKim, James, MD ED   Advance Care Planning Activity           Consults none DVT Prophylaxis  Lovenox   Lab Results  Component Value Date   PLT 245 08/05/2019     Time Spent in minutes  35min  Greater than 50% of time spent in care coordination and counseling patient regarding the condition and plan of care.   Auburn BilberryShreyang Harriet Bollen M.D on 08/05/2019 at 1:00 PM  Between 7am to 6pm - Pager - [phone]  After 6pm go to [email or web address] - Social research officer, governmentpassword [email or web address]  [facility]   Office  [phone]

## 2019-08-05 NOTE — Progress Notes (Signed)
cdiff discontinued in error by me earlier. Order replaced per Dr Ena Dawley request

## 2019-08-06 LAB — GLUCOSE, CAPILLARY
Glucose-Capillary: 93 mg/dL (ref 70–99)
Glucose-Capillary: 195 mg/dL — ABNORMAL HIGH (ref 70–99)
Glucose-Capillary: 93 mg/dL (ref 70–99)

## 2019-08-06 LAB — BASIC METABOLIC PANEL
Anion gap: 9 (ref 5–15)
BUN: 12 mg/dL (ref 8–23)
CO2: 26 mmol/L (ref 22–32)
Calcium: 8.9 mg/dL (ref 8.9–10.3)
Chloride: 99 mmol/L (ref 98–111)
Creatinine, Ser: 0.81 mg/dL (ref 0.44–1.00)
GFR calc Af Amer: >60 mL/min (ref >60)
GFR calc non Af Amer: >60 mL/min (ref >60)
Glucose, Bld: 160 mg/dL — ABNORMAL HIGH (ref 70–99)
Potassium: 3.4 mmol/L — ABNORMAL LOW (ref 3.5–5.1)
Sodium: 134 mmol/L — ABNORMAL LOW (ref 135–145)

## 2019-08-06 LAB — HIV ANTIBODY (ROUTINE TESTING W REFLEX): HIV Screen 4th Generation wRfx: NONREACTIVE

## 2019-08-06 LAB — HEMOGLOBIN A1C
Hgb A1c MFr Bld: 7 % — ABNORMAL HIGH (ref 4.8–5.6)
Mean Plasma Glucose: 154 mg/dL

## 2019-08-06 MED ORDER — POTASSIUM CHLORIDE CRYS ER 20 MEQ PO TBCR
20.0000 meq | EXTENDED_RELEASE_TABLET | Freq: Once | ORAL | Status: AC
  Administered 2019-08-06: 20 meq via ORAL
  Filled 2019-08-06: qty 1

## 2019-08-06 MED ORDER — LOPERAMIDE HCL 2 MG PO CAPS
4.0000 mg | ORAL_CAPSULE | Freq: Four times a day (QID) | ORAL | Status: AC
  Administered 2019-08-06 (×2): 4 mg via ORAL
  Filled 2019-08-06 (×2): qty 2

## 2019-08-06 NOTE — Care Management Important Message (Signed)
Important Message  Patient Details  Name: Nicole Pittman MRN: [id number] Date of Birth: 01-13-1951   Medicare Important Message Given:  Yes     Dannette Barbara 08/06/2019, 10:30 AM

## 2019-08-06 NOTE — Progress Notes (Signed)
[employer] - [place] [facility]                                                                                                                                                                                  Patient Demographics   Nicole Pittman, is a 68 y.o. female, DOB - 1951/01/31, TXM:[id number]  Admit date - 08/04/2019   Admitting Physician Auburn Bilberry, MD  Outpatient Primary MD for the patient is Osei-Bonsu, Greggory Stallion, MD   LOS - 2  Subjective: Still having diarrhea    Review of Systems:   CONSTITUTIONAL: No documented fever. No fatigue, weakness. No weight gain, no weight loss.  EYES: No blurry or double vision.  ENT: No tinnitus. No postnasal drip. No redness of the oropharynx.  RESPIRATORY: No cough, no wheeze, no hemoptysis. No dyspnea.  CARDIOVASCULAR: No chest pain. No orthopnea. No palpitations. No syncope.  GASTROINTESTINAL: No nausea, no vomiting or positive diarrhea. No abdominal pain. No melena or hematochezia.  GENITOURINARY: No dysuria or hematuria.  ENDOCRINE: No polyuria or nocturia. No heat or cold intolerance.  HEMATOLOGY: No anemia. No bruising. No bleeding.  INTEGUMENTARY: No rashes. No lesions.  MUSCULOSKELETAL: No arthritis. No swelling. No gout.  NEUROLOGIC: No numbness, tingling, or ataxia. No seizure-type activity.  PSYCHIATRIC: No anxiety. No insomnia. No ADD.    Vitals:   Vitals:   08/05/19 1147 08/05/19 2031 08/06/19 0417 08/06/19 0727  BP: (!) 142/56 (!) 126/96 (!) 159/73   Pulse: 74 99 76   Resp: 18 20 18    Temp: 98.9 F (37.2 C) 98.6 F (37 C) 98.6 F (37 C)   TempSrc: Oral Oral Oral   SpO2: 96% 97% 96% 99%  Weight:      Height:        Wt Readings from Last 3 Encounters:  08/04/19 58.5 kg  08/02/19 58.5 kg  09/17/18 57.1 kg     Intake/Output Summary (Last 24 hours) at 08/06/2019 1220 Last data filed at 08/06/2019 1026 Gross per 24 hour  Intake 1323.55 ml  Output 300 ml  Net 1023.55 ml    Physical  Exam:   GENERAL: Pleasant-appearing in no apparent distress.  HEAD, EYES, EARS, NOSE AND THROAT: Atraumatic, normocephalic. Extraocular muscles are intact. Pupils equal and reactive to light. Sclerae anicteric. No conjunctival injection. No oro-pharyngeal erythema.  NECK: Supple. There is no jugular venous distention. No bruits, no lymphadenopathy, no thyromegaly.  HEART: Regular rate and rhythm,. No murmurs, no rubs, no clicks.  LUNGS: Clear to auscultation bilaterally. No rales or rhonchi. No wheezes.  ABDOMEN: Soft, flat, nontender, nondistended. Has good bowel sounds. No hepatosplenomegaly appreciated.  EXTREMITIES: No evidence of any cyanosis, clubbing,  or peripheral edema.  +2 pedal and radial pulses bilaterally.  NEUROLOGIC: The patient is alert, awake, and oriented x3 with no focal motor or sensory deficits appreciated bilaterally.  SKIN: Moist and warm with no rashes appreciated.  Psych: Not anxious, depressed LN: No inguinal LN enlargement    Antibiotics   Anti-infectives (From admission, onward)   Start     Dose/Rate Route Frequency Ordered Stop   08/05/19 2200  ciprofloxacin (CIPRO) IVPB 400 mg     400 mg 200 mL/hr over 60 Minutes Intravenous Every 12 hours 08/04/19 1059     08/04/19 1100  metroNIDAZOLE (FLAGYL) IVPB 500 mg     500 mg 100 mL/hr over 60 Minutes Intravenous Every 8 hours 08/04/19 1059     08/04/19 0830  ciprofloxacin (CIPRO) IVPB 400 mg     400 mg 200 mL/hr over 60 Minutes Intravenous  Once 08/04/19 0815 08/04/19 1143   08/04/19 0830  metroNIDAZOLE (FLAGYL) IVPB 500 mg     500 mg 100 mL/hr over 60 Minutes Intravenous  Once 08/04/19 0815 08/04/19 1016      Medications   Scheduled Meds: . albuterol  3 mL Inhalation BID  . amitriptyline  25 mg Oral QHS  . aspirin EC  81 mg Oral Q supper  . atorvastatin  40 mg Oral QHS  . baclofen  10 mg Oral BID  . carbamide peroxide  5 drop Both EARS BID  . cholecalciferol   Oral Daily  . enoxaparin (LOVENOX)  injection  40 mg Subcutaneous Q24H  . fenofibrate  54 mg Oral Daily  . insulin aspart  0-5 Units Subcutaneous QHS  . insulin aspart  0-9 Units Subcutaneous TID WC  . levETIRAcetam  500 mg Oral QAC supper  . loperamide  4 mg Oral Q6H  . metoCLOPramide  10 mg Oral QID  . metoprolol tartrate  25 mg Oral BID  . oxyCODONE  5 mg Oral BID  . oxyCODONE  20 mg Oral Q supper  . quinapril  20 mg Oral Daily  . sodium chloride flush  3 mL Intravenous Q12H  . triamterene-hydrochlorothiazide  1 tablet Oral Daily  . zolpidem  5 mg Oral QHS   Continuous Infusions: . sodium chloride    . sodium chloride Stopped (08/05/19 1805)  . ciprofloxacin 400 mg (08/06/19 1158)  . metronidazole 500 mg (08/06/19 0246)   PRN Meds:.sodium chloride, sodium chloride, acetaminophen **OR** acetaminophen, alum & mag hydroxide-simeth, hydrALAZINE, ondansetron **OR** ondansetron (ZOFRAN) IV, sodium chloride flush, zolpidem   Data Review:   Micro Results Recent Results (from the past 240 hour(s))  SARS Coronavirus 2 [facility](Hospital order, Performed in [facility] hospital lab) Nasopharyngeal Nasopharyngeal Swab     Status: None   Collection Time: 08/02/19  9:59 AM   Specimen: Nasopharyngeal Swab  Result Value Ref Range Status   SARS Coronavirus 2 NEGATIVE NEGATIVE Final    Comment: (NOTE) If result is NEGATIVE SARS-CoV-2 target nucleic acids are NOT DETECTED. The SARS-CoV-2 RNA is generally detectable in upper and lower  respiratory specimens during the acute phase of infection. The lowest  concentration of SARS-CoV-2 viral copies this assay can detect is 250  copies / mL. A negative result does not preclude SARS-CoV-2 infection  and should not be used as the sole basis for treatment or other  patient management decisions.  A negative result may occur with  improper specimen collection / handling, submission of specimen other  than nasopharyngeal swab, presence of viral mutation(s) within the  areas  targeted by this  assay, and inadequate number of viral copies  (<250 copies / mL). A negative result must be combined with clinical  observations, patient history, and epidemiological information. If result is POSITIVE SARS-CoV-2 target nucleic acids are DETECTED. The SARS-CoV-2 RNA is generally detectable in upper and lower  respiratory specimens dur ing the acute phase of infection.  Positive  results are indicative of active infection with SARS-CoV-2.  Clinical  correlation with patient history and other diagnostic information is  necessary to determine patient infection status.  Positive results do  not rule out bacterial infection or co-infection with other viruses. If result is PRESUMPTIVE POSTIVE SARS-CoV-2 nucleic acids MAY BE PRESENT.   A presumptive positive result was obtained on the submitted specimen  and confirmed on repeat testing.  While 2019 novel coronavirus  (SARS-CoV-2) nucleic acids may be present in the submitted sample  additional confirmatory testing may be necessary for epidemiological  and / or clinical management purposes  to differentiate between  SARS-CoV-2 and other Sarbecovirus currently known to infect humans.  If clinically indicated additional testing with an alternate test  methodology [phone] is advised. The SARS-CoV-2 RNA is generally  detectable in upper and lower respiratory sp ecimens during the acute  phase of infection. The expected result is Negative. Fact Sheet for Patients:  BoilerBrush.com.cyhttps://www.fda.gov/media/136312/download Fact Sheet for Healthcare Providers: https://pope.com/https://www.fda.gov/media/136313/download This test is not yet approved or cleared by the [place] FDA and has been authorized for detection and/or diagnosis of SARS-CoV-2 by FDA under an Emergency Use Authorization (EUA).  This EUA will remain in effect (meaning this test can be used) for the duration of the COVID-19 declaration under Section 564(b)(1) of the Act, 21 U.S.C. section 360bbb-3(b)(1),  unless the authorization is terminated or revoked sooner. Performed at [facility], [address] [place], [place] [address]   SARS CORONAVIRUS 2 (TAT 6-24 HRS) Nasopharyngeal Nasopharyngeal Swab     Status: None   Collection Time: 08/04/19 11:44 AM   Specimen: Nasopharyngeal Swab  Result Value Ref Range Status   SARS Coronavirus 2 NEGATIVE NEGATIVE Final    Comment: (NOTE) SARS-CoV-2 target nucleic acids are NOT DETECTED. The SARS-CoV-2 RNA is generally detectable in upper and lower respiratory specimens during the acute phase of infection. Negative results do not preclude SARS-CoV-2 infection, do not rule out co-infections with other pathogens, and should not be used as the sole basis for treatment or other patient management decisions. Negative results must be combined with clinical observations, patient history, and epidemiological information. The expected result is Negative. Fact Sheet for Patients: HairSlick.nohttps://www.fda.gov/media/138098/download Fact Sheet for Healthcare Providers: quierodirigir.comhttps://www.fda.gov/media/138095/download This test is not yet approved or cleared by the [place] FDA and  has been authorized for detection and/or diagnosis of SARS-CoV-2 by FDA under an Emergency Use Authorization (EUA). This EUA will remain  in effect (meaning this test can be used) for the duration of the COVID-19 declaration under Section 56 4(b)(1) of the Act, 21 U.S.C. section 360bbb-3(b)(1), unless the authorization is terminated or revoked sooner. Performed at [facility], [address] [place], [place] [address]   C difficile quick scan w PCR reflex     Status: None   Collection Time: 08/05/19  6:46 PM   Specimen: STOOL  Result Value Ref Range Status   C Diff antigen NEGATIVE NEGATIVE Final   C Diff toxin NEGATIVE NEGATIVE Final   C Diff interpretation No C. difficile detected.  Final    Comment: Performed at  [facility], [address] [place], [place] [address]  Gastrointestinal Panel by PCR , Stool     Status: None   Collection Time: 08/05/19  6:46 PM   Specimen: STOOL  Result Value Ref Range Status   Campylobacter species NOT DETECTED NOT DETECTED Final   Plesimonas shigelloides NOT DETECTED NOT DETECTED Final   Salmonella species NOT DETECTED NOT DETECTED Final   Yersinia enterocolitica NOT DETECTED NOT DETECTED Final   Vibrio species NOT DETECTED NOT DETECTED Final   Vibrio cholerae NOT DETECTED NOT DETECTED Final   Enteroaggregative E coli (EAEC) NOT DETECTED NOT DETECTED Final   Enteropathogenic E coli (EPEC) NOT DETECTED NOT DETECTED Final   Enterotoxigenic E coli (ETEC) NOT DETECTED NOT DETECTED Final   Shiga like toxin producing E coli (STEC) NOT DETECTED NOT DETECTED Final   Shigella/Enteroinvasive E coli (EIEC) NOT DETECTED NOT DETECTED Final   Cryptosporidium NOT DETECTED NOT DETECTED Final   Cyclospora cayetanensis NOT DETECTED NOT DETECTED Final   Entamoeba histolytica NOT DETECTED NOT DETECTED Final   Giardia lamblia NOT DETECTED NOT DETECTED Final   Adenovirus F40/41 NOT DETECTED NOT DETECTED Final   Astrovirus NOT DETECTED NOT DETECTED Final   Norovirus GI/GII NOT DETECTED NOT DETECTED Final   Rotavirus A NOT DETECTED NOT DETECTED Final   Sapovirus (I, II, IV, and V) NOT DETECTED NOT DETECTED Final    Comment: Performed at [facility], [address] [place], [place] [address]    Radiology Reports Ct Abdomen Pelvis W Contrast  Result Date: 08/02/2019 CLINICAL DATA:  Abdominal pain.  Evaluate for diverticulitis. EXAM: CT ABDOMEN AND PELVIS WITH CONTRAST TECHNIQUE: Multidetector CT imaging of the abdomen and pelvis was performed using the standard protocol following bolus administration of intravenous contrast. CONTRAST:  180mL OMNIPAQUE IOHEXOL 300 MG/ML  SOLN COMPARISON:  None. FINDINGS: Lower chest: No acute abnormality. Hepatobiliary: Hepatic steatosis. Previous cholecystectomy. No  biliary dilatation. Pancreas: Unremarkable. No pancreatic ductal dilatation or surrounding inflammatory changes. Spleen: Normal in size without focal abnormality. Adrenals/Urinary Tract: Normal appearance of the adrenal glands. No kidney stone or hydronephrosis. Urinary bladder appears normal. Stomach/Bowel: Small hiatal hernia. Normal appearance of the small bowel loops. No small bowel wall thickening, inflammation or distension. Mild diffuse colonic wall thickening is identified. No significant diverticular disease identified. No acute diverticulitis noted. Vascular/Lymphatic: Aortic atherosclerosis. No abdominopelvic adenopathy. Reproductive: The uterus and adnexal structures are unremarkable. Other: No free fluid, or fluid collections. Musculoskeletal: Spondylosis identified. No aggressive lytic or sclerotic bone lesions. IMPRESSION: 1. Mild diffuse colonic wall thickening concerning for uncomplicated colitis. No findings to suggest diverticulitis. 2. Small hiatal hernia. 3.  Aortic Atherosclerosis (ICD10-I70.0). Electronically Signed   By: Kerby Moors M.D.   On: 08/02/2019 12:13     CBC Recent Labs  Lab 08/02/19 0956 08/04/19 0819 08/05/19 0449  WBC 12.1* 15.1* 10.0  HGB 13.1 13.7 11.3*  HCT 39.3 40.8 33.1*  PLT 301 378 245  MCV 89.9 88.3 87.8  MCH 30.0 29.7 30.0  MCHC 33.3 33.6 34.1  RDW 12.7 12.6 12.6  LYMPHSABS 2.3 2.8  --   MONOABS 0.7 1.1*  --   EOSABS 0.2 0.1  --   BASOSABS 0.0 0.1  --     Chemistries  Recent Labs  Lab 08/02/19 0956 08/04/19 0819 08/05/19 0449  NA 132* 136 137  K 3.4* 3.5 3.1*  CL 96* 101 103  CO2 22 17* 25  GLUCOSE 159* 135* 101*  BUN 21 21 11   CREATININE  0.89 0.93 0.77  CALCIUM 9.7 9.4 8.4*  AST 18 18  --   ALT 30 25  --   ALKPHOS 75 78  --   BILITOT 0.7 1.2  --    ------------------------------------------------------------------------------------------------------------------ estimated creatinine clearance is 54.9 mL/min (by C-G formula  based on SCr of 0.77 mg/dL). ------------------------------------------------------------------------------------------------------------------ Recent Labs    08/04/19 0819  HGBA1C 7.0*   ------------------------------------------------------------------------------------------------------------------ No results for input(s): CHOL, HDL, LDLCALC, TRIG, CHOLHDL, LDLDIRECT in the last 72 hours. ------------------------------------------------------------------------------------------------------------------ No results for input(s): TSH, T4TOTAL, T3FREE, THYROIDAB in the last 72 hours.  Invalid input(s): FREET3 ------------------------------------------------------------------------------------------------------------------ No results for input(s): VITAMINB12, FOLATE, FERRITIN, TIBC, IRON, RETICCTPCT in the last 72 hours.  Coagulation profile No results for input(s): INR, PROTIME in the last 168 hours.  No results for input(s): DDIMER in the last 72 hours.  Cardiac Enzymes No results for input(s): CKMB, TROPONINI, MYOGLOBIN in the last 168 hours.  Invalid input(s): CK ------------------------------------------------------------------------------------------------------------------ Invalid input(s): POCBNP    Assessment & Plan   IMPRESSION AND PLAN: Patient is 68 year old white female with COPD presenting with diarrhea noted to have colitis  1.  Acute colitis  Continue IV Cipro and Flagyl Stool c diff neg and stool cx negative  2.  Accelerated hypertension continue home meds with PRN IV hydralazine  3.  COPD without exasperation use PRN nebs continue inhalers as doing at home  4.  Diabetes type 2   continue sliding-scale  5.  Hyperlipidemia continue Lipitor and fenofibrate  6.    Hypokalemia replace K+  7.  Misc: lovenox     Code Status Orders  (From admission, onward)         Start     Ordered   08/04/19 1429  Full code  Continuous     08/04/19 1428         Code Status History    Date Active Date Inactive Code Status Order ID Comments User Context   07/08/2018 2216 07/09/2018 2107 Full Code [id number]  Pearson Grippe, MD ED   Advance Care Planning Activity           Consults none DVT Prophylaxis  Lovenox   Lab Results  Component Value Date   PLT 245 08/05/2019     Time Spent in minutes   Greater than 50% of time spent in care coordination and counseling patient regarding the condition and plan of care.   Auburn Bilberry M.D on 08/06/2019 at 12:20 PM  Between 7am to 6pm - Pager - [phone]  After 6pm go to [email or web address] - Social research officer, government  [employer]   Office  [phone]

## 2019-08-07 LAB — BASIC METABOLIC PANEL
Anion gap: 9 (ref 5–15)
BUN: 14 mg/dL (ref 8–23)
CO2: 25 mmol/L (ref 22–32)
Calcium: 8.8 mg/dL — ABNORMAL LOW (ref 8.9–10.3)
Chloride: 103 mmol/L (ref 98–111)
Creatinine, Ser: 0.8 mg/dL (ref 0.44–1.00)
GFR calc Af Amer: >60 mL/min (ref >60)
GFR calc non Af Amer: >60 mL/min (ref >60)
Glucose, Bld: 102 mg/dL — ABNORMAL HIGH (ref 70–99)
Potassium: 3.2 mmol/L — ABNORMAL LOW (ref 3.5–5.1)
Sodium: 137 mmol/L (ref 135–145)

## 2019-08-07 LAB — GLUCOSE, CAPILLARY
Glucose-Capillary: 144 mg/dL — ABNORMAL HIGH (ref 70–99)
Glucose-Capillary: 94 mg/dL (ref 70–99)
Glucose-Capillary: 156 mg/dL — ABNORMAL HIGH (ref 70–99)

## 2019-08-07 LAB — MAGNESIUM: Magnesium: 1.9 mg/dL (ref 1.7–2.4)

## 2019-08-07 MED ORDER — POTASSIUM CHLORIDE CRYS ER 20 MEQ PO TBCR
40.0000 meq | EXTENDED_RELEASE_TABLET | Freq: Once | ORAL | Status: AC
  Administered 2019-08-07: 40 meq via ORAL
  Filled 2019-08-07: qty 2

## 2019-08-07 MED ORDER — LOPERAMIDE HCL 2 MG PO CAPS: 4.0000 mg | ORAL_CAPSULE | Freq: Four times a day (QID) | ORAL | 0 refills | Status: DC

## 2019-08-07 MED ORDER — CIPROFLOXACIN HCL 500 MG PO TABS: 500.0000 mg | ORAL_TABLET | Freq: Two times a day (BID) | ORAL | 0 refills | Status: AC

## 2019-08-07 MED ORDER — ONDANSETRON HCL 4 MG PO TABS: 4.0000 mg | ORAL_TABLET | Freq: Four times a day (QID) | ORAL | 0 refills | Status: DC | PRN

## 2019-08-07 NOTE — Discharge Instructions (Signed)
It was so nice to meet you during this hospitalization!  You came into the hospital with nausea, vomiting, and diarrhea. We found that you had an infection of your intestines.  I have prescribed the following medications: 1. Take Flagyl 500mg  three times a day, starting this evening and continuing for 3 more days- this is one of your antibiotics. 2. Take Ciprofloxacin 500mg  twice a day, starting this evening and continuing for 3 more days- this is your other antibiotic 3. You can take Immodium 4mg  every 6 hours as needed for diarrhea 4. You can take Zofran 4mg  every 6 hours as needed for nausea  Take care, Dr. Brett Albino

## 2019-08-07 NOTE — Discharge Summary (Signed)
[facility] [place] at [facility]   PATIENT NAME: Nicole CuminsDelores Pittman    MR#:  [id number]  DATE OF BIRTH:  1951/06/18  DATE OF ADMISSION:  08/04/2019   ADMITTING PHYSICIAN: Auburn BilberryShreyang Patel, MD  DATE OF DISCHARGE: 08/07/19  PRIMARY CARE PHYSICIAN: Jackie Plumsei-Bonsu, George, MD   ADMISSION DIAGNOSIS:  Dehydration [E86.0] Colitis [K52.9] DISCHARGE DIAGNOSIS:  Active Problems:   Colitis  SECONDARY DIAGNOSIS:   Past Medical History:  Diagnosis Date  . Adhesive arachnoiditis   . Arthritis   . Back problem    spurs  . COPD (chronic obstructive pulmonary disease) (HCC)   . Diabetes (HCC)   . High cholesterol   . Hypertension   . Neck problem    bad disc   HOSPITAL COURSE:   Nicole Pittman is a 68 year old female who presented to the ED with nausea, vomiting, and diarrhea.  She had been [facility] a few days prior and had a CT scan that showed colitis.  She was discharged home on ciprofloxacin and Flagyl.  She continued having diarrhea, so she was admitted for further management.  Acute colitis- vomiting has resolved. Diarrhea has greatly improved.  -GI pathogen panel and C. difficile testing was negative -Discharged home on cipro and flagyl for a total 7 day course -Also discharged home with Imodium and Zofran as needed  Hypertension  -Home BP meds were continued  COPD-no signs of acute exacerbation -Home inhalers were continued  Type 2 diabetes -Home diabetes medicines were continued on discharge  Hyperlipidemia -Lipitor and fenofibrate were continued  Hypokalemia-due to diarrhea -Potassium was replaced -Recheck BMP as an outpatient  DISCHARGE CONDITIONS:  Acute colitis Hypertension COPD Type 2 diabetes Hyperlipidemia Hypokalemia CONSULTS OBTAINED:  None DRUG ALLERGIES:   Allergies  Allergen Reactions  . Fentanyl Itching and Rash    PATCHES  . Codeine Hives  . Morphine And Related Hives  . Symbicort [Budesonide-Formoterol  Fumarate] Other (See Comments)    Made patient's chest "hurt" and affected vision (blurred)  . Zanaflex [Tizanidine] Palpitations and Other (See Comments)    And worsened insomina   DISCHARGE MEDICATIONS:   Allergies as of 08/07/2019      Reactions   Fentanyl Itching, Rash   PATCHES   Codeine Hives   Morphine And Related Hives   Symbicort [budesonide-formoterol Fumarate] Other (See Comments)   Made patient's chest "hurt" and affected vision (blurred)   Zanaflex [tizanidine] Palpitations, Other (See Comments)   And worsened insomina      Medication List    TAKE these medications   amitriptyline 25 MG tablet Commonly known as: ELAVIL Take 25 mg by mouth at bedtime.   aspirin 81 MG tablet Take 81 mg by mouth daily with supper.   atorvastatin 40 MG tablet Commonly known as: LIPITOR Take 40 mg by mouth at bedtime.   baclofen 10 MG tablet Commonly known as: LIORESAL Take 10 mg by mouth 2 (two) times daily.   ciprofloxacin 500 MG tablet Commonly known as: Cipro Take 1 tablet (500 mg total) by mouth 2 (two) times daily for 4 days.   Easy Comfort Lancets Misc 4 (four) times daily. for testing   fenofibrate 48 MG tablet Commonly known as: TRICOR Take 48 mg by mouth at bedtime.   FREESTYLE LITE test strip Generic drug: glucose blood 4 (four) times daily. for testing   glipiZIDE-metformin 5-500 MG tablet Commonly known as: METAGLIP Take 1 tablet by mouth 2 (two) times daily.   levETIRAcetam 500 MG  tablet Commonly known as: KEPPRA Take 500 mg by mouth daily.   loperamide 2 MG capsule Commonly known as: IMODIUM Take 2 capsules (4 mg total) by mouth every 6 (six) hours. For diarrhea   metoCLOPramide 10 MG tablet Commonly known as: REGLAN Take 10 mg by mouth 4 (four) times daily.   metoprolol tartrate 25 MG tablet Commonly known as: LOPRESSOR Take 1 tablet (25 mg total) by mouth 2 (two) times daily.   metroNIDAZOLE 500 MG tablet Commonly known as: FLAGYL Take  1 tablet (500 mg total) by mouth 3 (three) times daily for 7 days.   nitroGLYCERIN 0.4 MG SL tablet Commonly known as: NITROSTAT Place 1 tablet (0.4 mg total) under the tongue every 5 (five) minutes as needed for chest pain. MAX 3 doses in 15 minutes   ondansetron 4 MG tablet Commonly known as: ZOFRAN Take 1 tablet (4 mg total) by mouth every 6 (six) hours as needed for nausea. What changed:  when to take this reasons to take this   OxyCONTIN 20 mg 12 hr tablet Generic drug: oxyCODONE Take 20 mg by mouth daily with supper.   oxyCODONE 5 MG immediate release tablet Commonly known as: Oxy IR/ROXICODONE Take 5 mg by mouth 2 (two) times daily.   ProAir HFA 108 (90 Base) MCG/ACT inhaler Generic drug: albuterol Inhale 2 puffs into the lungs 2 (two) times daily.   quinapril 20 MG tablet Commonly known as: ACCUPRIL Take 20 mg by mouth daily.   triamterene-hydrochlorothiazide 37.5-25 MG tablet Commonly known as: MAXZIDE-25 Take 1 tablet by mouth daily.   VITAMIN D-3 PO Take 1 capsule by mouth daily.   zolpidem 10 MG tablet Commonly known as: AMBIEN Take 10 mg by mouth daily.        DISCHARGE INSTRUCTIONS:  1.  Follow-up with PCP in 5 days 2.  Take Cipro and Flagyl as prescribed for a total 7-day course 3.  Can take Imodium and Zofran as needed DIET:  Cardiac diet and Diabetic diet DISCHARGE CONDITION:  Stable ACTIVITY:  Activity as tolerated OXYGEN:  Home Oxygen: No.  Oxygen Delivery: room air DISCHARGE LOCATION:  home   If you experience worsening of your admission symptoms, develop shortness of breath, life threatening emergency, suicidal or homicidal thoughts you must seek medical attention immediately by calling 911 or calling your MD immediately  if symptoms less severe.  You Must read complete instructions/literature along with all the possible adverse reactions/side effects for all the Medicines you take and that have been prescribed to you. Take any new  Medicines after you have completely understood and accpet all the possible adverse reactions/side effects.   Please note  You were cared for by a hospitalist during your hospital stay. If you have any questions about your discharge medications or the care you received while you were in the hospital after you are discharged, you can call the unit and asked to speak with the hospitalist on call if the hospitalist that took care of you is not available. Once you are discharged, your primary care physician will handle any further medical issues. Please note that NO REFILLS for any discharge medications will be authorized once you are discharged, as it is imperative that you return to your primary care physician (or establish a relationship with a primary care physician if you do not have one) for your aftercare needs so that they can reassess your need for medications and monitor your lab values.    On the day of Discharge:  VITAL SIGNS:  Blood pressure (!) 116/53, pulse 68, temperature 98.9 F (37.2 C), temperature source Oral, resp. rate 20, height 5\' 3"  (1.6 m), weight 58.5 kg, SpO2 99 %. PHYSICAL EXAMINATION:  GENERAL:  68 y.o.-year-old patient lying in the bed with no acute distress.  EYES: Pupils equal, round, reactive to light and accommodation. No scleral icterus. Extraocular muscles intact.  HEENT: Head atraumatic, normocephalic. Oropharynx and nasopharynx clear.  NECK:  Supple, no jugular venous distention. No thyroid enlargement, no tenderness.  LUNGS: Normal breath sounds bilaterally, no wheezing, rales,rhonchi or crepitation. No use of accessory muscles of respiration.  CARDIOVASCULAR: RRR, S1, S2 normal. No murmurs, rubs, or gallops.  ABDOMEN: Soft, non-tender, non-distended. Bowel sounds present. No organomegaly or mass.  EXTREMITIES: No pedal edema, cyanosis, or clubbing.  NEUROLOGIC: Cranial nerves II through XII are intact. Muscle strength 5/5 in all extremities. Sensation intact.  Gait not checked.  PSYCHIATRIC: The patient is alert and oriented x 3.  SKIN: No obvious rash, lesion, or ulcer.  DATA REVIEW:   CBC Recent Labs  Lab 08/05/19 0449  WBC 10.0  HGB 11.3*  HCT 33.1*  PLT 245    Chemistries  Recent Labs  Lab 08/04/19 0819  08/07/19 0519  NA 136   < > 137  K 3.5   < > 3.2*  CL 101   < > 103  CO2 17*   < > 25  GLUCOSE 135*   < > 102*  BUN 21   < > 14  CREATININE 0.93   < > 0.80  CALCIUM 9.4   < > 8.8*  AST 18  --   --   ALT 25  --   --   ALKPHOS 78  --   --   BILITOT 1.2  --   --    < > = values in this interval not displayed.     Microbiology Results  Results for orders placed or performed during the hospital encounter of 08/04/19  SARS CORONAVIRUS 2 (TAT 6-24 HRS) Nasopharyngeal Nasopharyngeal Swab     Status: None   Collection Time: 08/04/19 11:44 AM   Specimen: Nasopharyngeal Swab  Result Value Ref Range Status   SARS Coronavirus 2 NEGATIVE NEGATIVE Final    Comment: (NOTE) SARS-CoV-2 target nucleic acids are NOT DETECTED. The SARS-CoV-2 RNA is generally detectable in upper and lower respiratory specimens during the acute phase of infection. Negative results do not preclude SARS-CoV-2 infection, do not rule out co-infections with other pathogens, and should not be used as the sole basis for treatment or other patient management decisions. Negative results must be combined with clinical observations, patient history, and epidemiological information. The expected result is Negative. Fact Sheet for Patients: HairSlick.no Fact Sheet for Healthcare Providers: quierodirigir.com This test is not yet approved or cleared by the [place] FDA and  has been authorized for detection and/or diagnosis of SARS-CoV-2 by FDA under an Emergency Use Authorization (EUA). This EUA will remain  in effect (meaning this test can be used) for the duration of the COVID-19 declaration under  Section 56 4(b)(1) of the Act, 21 U.S.C. section 360bbb-3(b)(1), unless the authorization is terminated or revoked sooner. Performed at [facility], [address] [place], [place] [address]   C difficile quick scan w PCR reflex     Status: None   Collection Time: 08/05/19  6:46 PM   Specimen: STOOL  Result Value Ref Range Status   C Diff antigen NEGATIVE NEGATIVE Final  C Diff toxin NEGATIVE NEGATIVE Final   C Diff interpretation No C. difficile detected.  Final    Comment: Performed at [facility], [address] [place], [place] [address]  Gastrointestinal Panel by PCR , Stool     Status: None   Collection Time: 08/05/19  6:46 PM   Specimen: STOOL  Result Value Ref Range Status   Campylobacter species NOT DETECTED NOT DETECTED Final   Plesimonas shigelloides NOT DETECTED NOT DETECTED Final   Salmonella species NOT DETECTED NOT DETECTED Final   Yersinia enterocolitica NOT DETECTED NOT DETECTED Final   Vibrio species NOT DETECTED NOT DETECTED Final   Vibrio cholerae NOT DETECTED NOT DETECTED Final   Enteroaggregative E coli (EAEC) NOT DETECTED NOT DETECTED Final   Enteropathogenic E coli (EPEC) NOT DETECTED NOT DETECTED Final   Enterotoxigenic E coli (ETEC) NOT DETECTED NOT DETECTED Final   Shiga like toxin producing E coli (STEC) NOT DETECTED NOT DETECTED Final   Shigella/Enteroinvasive E coli (EIEC) NOT DETECTED NOT DETECTED Final   Cryptosporidium NOT DETECTED NOT DETECTED Final   Cyclospora cayetanensis NOT DETECTED NOT DETECTED Final   Entamoeba histolytica NOT DETECTED NOT DETECTED Final   Giardia lamblia NOT DETECTED NOT DETECTED Final   Adenovirus F40/41 NOT DETECTED NOT DETECTED Final   Astrovirus NOT DETECTED NOT DETECTED Final   Norovirus GI/GII NOT DETECTED NOT DETECTED Final   Rotavirus A NOT DETECTED NOT DETECTED Final   Sapovirus (I, II, IV, and V) NOT DETECTED NOT DETECTED Final    Comment: Performed at [facility], [address] [place], [place] [address]    RADIOLOGY:  No results found.   Management plans discussed with the patient, family and they are in agreement.  CODE STATUS: Full Code   TOTAL TIME TAKING CARE OF THIS PATIENT: 40 minutes.    Jinny BlossomKaty D Mayo M.D on 08/07/2019 at 8:28 AM  Between 7am to 6pm - Pager - [phone]  After 6pm go to [email or web address] - Social research officer, governmentpassword [email or web address]  [facility] [place] [facility]  Office  [phone]  CC: Primary care physician; Jackie Plumsei-Bonsu, George, MD   Note: This dictation was prepared with Dragon dictation along with smaller phrase technology. Any transcriptional errors that result from this process are unintentional.

## 2019-08-11 DIAGNOSIS — E782 Mixed hyperlipidemia: Secondary | ICD-10-CM | POA: Diagnosis not present

## 2019-08-11 DIAGNOSIS — J449 Chronic obstructive pulmonary disease, unspecified: Secondary | ICD-10-CM | POA: Diagnosis not present

## 2019-08-11 DIAGNOSIS — I4891 Unspecified atrial fibrillation: Secondary | ICD-10-CM | POA: Diagnosis not present

## 2019-08-11 DIAGNOSIS — G250 Essential tremor: Secondary | ICD-10-CM | POA: Diagnosis not present

## 2019-08-11 DIAGNOSIS — I10 Essential (primary) hypertension: Secondary | ICD-10-CM | POA: Diagnosis not present

## 2019-08-11 DIAGNOSIS — E119 Type 2 diabetes mellitus without complications: Secondary | ICD-10-CM | POA: Diagnosis not present

## 2019-08-11 DIAGNOSIS — R569 Unspecified convulsions: Secondary | ICD-10-CM | POA: Diagnosis not present

## 2019-08-11 DIAGNOSIS — Z87891 Personal history of nicotine dependence: Secondary | ICD-10-CM | POA: Diagnosis not present

## 2019-08-31 DIAGNOSIS — M47816 Spondylosis without myelopathy or radiculopathy, lumbar region: Secondary | ICD-10-CM | POA: Diagnosis not present

## 2019-08-31 DIAGNOSIS — Z79891 Long term (current) use of opiate analgesic: Secondary | ICD-10-CM | POA: Diagnosis not present

## 2019-08-31 DIAGNOSIS — G894 Chronic pain syndrome: Secondary | ICD-10-CM | POA: Diagnosis not present

## 2019-08-31 DIAGNOSIS — Z5181 Encounter for therapeutic drug level monitoring: Secondary | ICD-10-CM | POA: Diagnosis not present

## 2019-08-31 DIAGNOSIS — M545 Low back pain: Secondary | ICD-10-CM | POA: Diagnosis not present

## 2019-08-31 DIAGNOSIS — M961 Postlaminectomy syndrome, not elsewhere classified: Secondary | ICD-10-CM | POA: Diagnosis not present

## 2019-08-31 DIAGNOSIS — M1288 Other specific arthropathies, not elsewhere classified, other specified site: Secondary | ICD-10-CM | POA: Diagnosis not present

## 2019-09-01 DIAGNOSIS — I4891 Unspecified atrial fibrillation: Secondary | ICD-10-CM | POA: Diagnosis not present

## 2019-09-01 DIAGNOSIS — E782 Mixed hyperlipidemia: Secondary | ICD-10-CM | POA: Diagnosis not present

## 2019-09-01 DIAGNOSIS — I10 Essential (primary) hypertension: Secondary | ICD-10-CM | POA: Diagnosis not present

## 2019-09-01 DIAGNOSIS — Z87891 Personal history of nicotine dependence: Secondary | ICD-10-CM | POA: Diagnosis not present

## 2019-09-01 DIAGNOSIS — G250 Essential tremor: Secondary | ICD-10-CM | POA: Diagnosis not present

## 2019-09-01 DIAGNOSIS — E119 Type 2 diabetes mellitus without complications: Secondary | ICD-10-CM | POA: Diagnosis not present

## 2019-09-01 DIAGNOSIS — R569 Unspecified convulsions: Secondary | ICD-10-CM | POA: Diagnosis not present

## 2019-09-01 DIAGNOSIS — J449 Chronic obstructive pulmonary disease, unspecified: Secondary | ICD-10-CM | POA: Diagnosis not present

## 2019-10-14 ENCOUNTER — Ambulatory Visit (INDEPENDENT_AMBULATORY_CARE_PROVIDER_SITE_OTHER): Payer: PPO | Admitting: Internal Medicine

## 2019-10-14 ENCOUNTER — Encounter: Payer: Self-pay | Admitting: Internal Medicine

## 2019-10-14 ENCOUNTER — Other Ambulatory Visit: Payer: Self-pay

## 2019-10-14 VITALS — BP 154/65 | HR 64 | Temp 97.0°F | Ht 63.0 in | Wt 131.0 lb

## 2019-10-14 DIAGNOSIS — I480 Paroxysmal atrial fibrillation: Secondary | ICD-10-CM

## 2019-10-14 DIAGNOSIS — R1310 Dysphagia, unspecified: Secondary | ICD-10-CM | POA: Diagnosis not present

## 2019-10-14 DIAGNOSIS — R079 Chest pain, unspecified: Secondary | ICD-10-CM | POA: Diagnosis not present

## 2019-10-14 NOTE — Progress Notes (Signed)
OFFICE NOTE  Chief Complaint:  Follow-up  Primary Care Physician: Benito Mccreedy, MD  HPI:  Nicole Pittman is a 68 y.o. female with a past medial history significant for recent hospitalization for chest pain and mild dyspnea.  There was concern for A. fib and she was referred to the hospital by her PCP for this however no EKG or evidence of A. fib was found.  Her metoprolol was increased to 25 mg twice daily.  An echocardiogram showed normal EF with normal size atria.  Subsequently she was discharged and reports that she has had periods of rapid irregular heartbeat.  She was placed on a monitor by Jory Sims, DNP, who saw her in follow-up.  This showed predominantly sinus rhythm with infrequent PVCs occurring in bigeminal and trigeminal patterns.  I personally reviewed the study.  There was no evidence for atrial fibrillation.  She also underwent coronary artery CT scanning which showed a calcium score of 12 and minimal nonobstructive coronary disease.  I shared those findings with her today and she reports her chest pain has largely resolved.  She still feels some palpitations.  She is also been having issues with swallowing.  She says at times she feels that food may get stuck in her chest and she has had some relief in her chest discomfort with nitroglycerin suggesting perhaps esophageal stricture.  She is on medications for gastric motility and was previously seen by a gastroenterologist for chronic problems in [place], [place].  He has not established with a GI doctor here.  10/14/2019  Nicole Pittman returns today for follow-up.  Overall she reports feeling well.  She denies any chest pain or worsening shortness of breath.  Blood pressure is elevated today however is better controlled at home between 016 010 systolic.  I had referred her to GI for further evaluation however she reports that she never got a call about the appointment.  She did not follow-up with [place].  She  continues to have some left-sided chest discomfort.  She does have a known thoracic radiculopathy which could be part of it.  She also has a history of some gastric motility disorder and was previously seeing a gastroenterologist in [place].  She is not established here and we will try to refer her back to GI.  PMHx:  Past Medical History:  Diagnosis Date  . Adhesive arachnoiditis   . Arthritis   . Back problem    spurs  . COPD (chronic obstructive pulmonary disease) ([place])   . Diabetes ([place])   . High cholesterol   . Hypertension   . Neck problem    bad disc    Past Surgical History:  Procedure Laterality Date  . BACK SURGERY  1978   L5  . CHOLECYSTECTOMY  1992  . KNEE SURGERY Bilateral 1993  . OVARIAN CYST REMOVAL  1985  . TONSILLECTOMY  1959  . WRIST SURGERY Bilateral 2003    FAMHx:  Family History  Problem Relation Age of Onset  . Rheum arthritis Mother   . Hypertension Mother   . Parkinson's disease Mother   . Parkinsonism Mother   . Diabetes Father   . Breast cancer Maternal Aunt   . Lupus Maternal Aunt     SOCHx:   reports that she has been smoking cigarettes. She has been smoking about 0.50 packs per day. She has never used smokeless tobacco. She reports that she does not drink alcohol or use drugs.  ALLERGIES:  Allergies  Allergen  Reactions  . Fentanyl Itching and Rash    PATCHES  . Codeine Hives  . Morphine And Related Hives  . Symbicort [Budesonide-Formoterol Fumarate] Other (See Comments)    Made patient's chest "hurt" and affected vision (blurred)  . Zanaflex [Tizanidine] Palpitations and Other (See Comments)    And worsened insomina    ROS: Pertinent items noted in HPI and remainder of comprehensive ROS otherwise negative.  HOME MEDS: Current Outpatient Medications on File Prior to Visit  Medication Sig Dispense Refill  . amitriptyline (ELAVIL) 25 MG tablet Take 25 mg by mouth at bedtime.    Marland Kitchen aspirin 81 MG tablet Take 81 mg by mouth daily  with supper.     Marland Kitchen atorvastatin (LIPITOR) 40 MG tablet Take 40 mg by mouth at bedtime.     . baclofen (LIORESAL) 10 MG tablet Take 10 mg by mouth 2 (two) times daily.     . Cholecalciferol (VITAMIN D-3 PO) Take 1 capsule by mouth daily.    Marland Kitchen EASY COMFORT LANCETS MISC 4 (four) times daily. for testing  99  . fenofibrate (TRICOR) 48 MG tablet Take 48 mg by mouth at bedtime.     Marland Kitchen FREESTYLE LITE test strip 4 (four) times daily. for testing  99  . glipiZIDE-metformin (METAGLIP) 5-500 MG tablet Take 1 tablet by mouth 2 (two) times daily.   0  . levETIRAcetam (KEPPRA) 500 MG tablet Take 500 mg by mouth daily.    Marland Kitchen loperamide (IMODIUM) 2 MG capsule Take 2 capsules (4 mg total) by mouth every 6 (six) hours. For diarrhea 30 capsule 0  . metoCLOPramide (REGLAN) 10 MG tablet Take 10 mg by mouth 4 (four) times daily.    . metoprolol tartrate (LOPRESSOR) 25 MG tablet Take 1 tablet (25 mg total) by mouth 2 (two) times daily. (Patient taking differently: Take 100 mg by mouth 2 (two) times daily. ) 60 tablet 11  . nitroGLYCERIN (NITROSTAT) 0.4 MG SL tablet Place 1 tablet (0.4 mg total) under the tongue every 5 (five) minutes as needed for chest pain. MAX 3 doses in 15 minutes 25 tablet 3  . ondansetron (ZOFRAN) 4 MG tablet Take 1 tablet (4 mg total) by mouth every 6 (six) hours as needed for nausea. 20 tablet 0  . oxyCODONE (OXY IR/ROXICODONE) 5 MG immediate release tablet Take 5 mg by mouth 2 (two) times daily.     . OXYCONTIN 20 MG 12 hr tablet Take 20 mg by mouth daily with supper.     Marland Kitchen PROAIR HFA 108 (90 Base) MCG/ACT inhaler Inhale 2 puffs into the lungs 2 (two) times daily.   5  . quinapril (ACCUPRIL) 20 MG tablet Take 20 mg by mouth daily.  1  . triamterene-hydrochlorothiazide (MAXZIDE-25) 37.5-25 MG tablet Take 1 tablet by mouth daily.  1  . zolpidem (AMBIEN) 10 MG tablet Take 10 mg by mouth daily.     No current facility-administered medications on file prior to visit.     LABS/IMAGING: No results  found for this or any previous visit (from the past 48 hour(s)). No results found.  LIPID PANEL:    Component Value Date/Time   CHOL 141 07/09/2018 0019   TRIG 150 (H) 07/09/2018 0019   HDL 48 07/09/2018 0019   CHOLHDL 2.9 07/09/2018 0019   VLDL 30 07/09/2018 0019   LDLCALC 63 07/09/2018 0019     WEIGHTS: Wt Readings from Last 3 Encounters:  10/14/19 131 lb (59.4 kg)  08/04/19 129 lb (58.5 kg)  08/02/19 129 lb (58.5 kg)    VITALS: BP (!) 154/65   Pulse 64   Temp (!) 97 F (36.1 C)   Ht 5\' 3"  (1.6 m)   Wt 131 lb (59.4 kg)   SpO2 98%   BMI 23.21 kg/m   EXAM: General appearance: alert and no distress Neck: no carotid bruit, no JVD and thyroid not enlarged, symmetric, no tenderness/mass/nodules Lungs: clear to auscultation bilaterally Heart: regular rate and rhythm, S1, S2 normal, no murmur, click, rub or gallop Abdomen: soft, non-tender; bowel sounds normal; no masses,  no organomegaly Extremities: extremities normal, atraumatic, no cyanosis or edema Pulses: 2+ and symmetric Skin: Skin color, texture, turgor normal. No rashes or lesions Neurologic: Grossly normal Psych: Pleasant  EKG: Sinus rhythm at 64-personally reviewed  ASSESSMENT: 1. Atypical chest pain-low coronary artery calcium score of 12, minimal nonobstructive coronary disease (08/2018-by coronary artery CT scan) 2. Normal LVEF by echo (08/2018) 3. No evidence of atrial fibrillation  PLAN: 1.   Nicole Pittman has chest discomfort which is likely noncardiac.  She has a long-standing GI history and is on a motility agent.  She is never had a previous esophageal dilatation however reports some difficulty with food getting stuck in pills when she swallows.  I like to refer her to gastroenterology for further evaluation of this.  The nitroglycerin she is taking may be helpful as it relaxes the smooth muscle of the esophagus as well.   Follow-up annually.  Chrystie NoseKenneth C. Hilty, MD, [facility] LL[facility]ACC, FACP  [place]  [facility]  Medical Director of the Advanced Lipid Disorders &  Cardiovascular Risk Reduction Clinic Diplomate of the American Board of Clinical Lipidology Attending Cardiologist  Direct Dial: [phone]  Fax: [phone]  Website:  [email or web address]   Kenneth C Hilty 10/14/2019, 5:25 PM

## 2019-10-14 NOTE — Patient Instructions (Signed)
Medication Instructions:  Your physician recommends that you continue on your current medications as directed. Please refer to the Current Medication list given to you today.  *If you need a refill on your cardiac medications before your next appointment, please call your pharmacy*  Lab Work: NONE If you have labs (blood work) drawn today and your tests are completely normal, you will receive your results only by: Marland Kitchen MyChart Message (if you have MyChart) OR . A paper copy in the mail If you have any lab test that is abnormal or we need to change your treatment, we will call you to review the results.  Testing/Procedures: NONE  Follow-Up: At [facility], you and your health needs are our priority.  As part of our continuing mission to provide you with exceptional heart care, we have created designated Provider Care Teams.  These Care Teams include your primary Cardiologist (physician) and Advanced Practice Providers (APPs -  Physician Assistants and Nurse Practitioners) who all work together to provide you with the care you need, when you need it.  Your next appointment:   12 month(s)  The format for your next appointment:   In Person  Provider:   You may see Pixie Casino, MD or one of the following Advanced Practice Providers on your designated Care Team:    Almyra Deforest, PA-C  Fabian Sharp, PA-C or   Roby Lofts, [place]   Other Instructions You have been referred to Dr. [facility]

## 2019-10-14 NOTE — Addendum Note (Signed)
Addended by: Pixie Casino on: 10/14/2019 05:44 PM   Modules accepted: Level of Service

## 2019-10-15 ENCOUNTER — Encounter: Payer: Self-pay | Admitting: Gastroenterology

## 2019-10-26 ENCOUNTER — Other Ambulatory Visit: Payer: Self-pay | Admitting: Internal Medicine

## 2019-10-26 DIAGNOSIS — Z1231 Encounter for screening mammogram for malignant neoplasm of breast: Secondary | ICD-10-CM

## 2019-10-28 DIAGNOSIS — Z5181 Encounter for therapeutic drug level monitoring: Secondary | ICD-10-CM | POA: Diagnosis not present

## 2019-10-28 DIAGNOSIS — G894 Chronic pain syndrome: Secondary | ICD-10-CM | POA: Diagnosis not present

## 2019-10-28 DIAGNOSIS — M961 Postlaminectomy syndrome, not elsewhere classified: Secondary | ICD-10-CM | POA: Diagnosis not present

## 2019-10-28 DIAGNOSIS — M545 Low back pain: Secondary | ICD-10-CM | POA: Diagnosis not present

## 2019-10-29 DIAGNOSIS — I10 Essential (primary) hypertension: Secondary | ICD-10-CM | POA: Diagnosis not present

## 2019-10-29 DIAGNOSIS — Z1389 Encounter for screening for other disorder: Secondary | ICD-10-CM | POA: Diagnosis not present

## 2019-10-29 DIAGNOSIS — Z0100 Encounter for examination of eyes and vision without abnormal findings: Secondary | ICD-10-CM | POA: Diagnosis not present

## 2019-10-29 DIAGNOSIS — Z1211 Encounter for screening for malignant neoplasm of colon: Secondary | ICD-10-CM | POA: Diagnosis not present

## 2019-10-29 DIAGNOSIS — E119 Type 2 diabetes mellitus without complications: Secondary | ICD-10-CM | POA: Diagnosis not present

## 2019-10-29 DIAGNOSIS — R569 Unspecified convulsions: Secondary | ICD-10-CM | POA: Diagnosis not present

## 2019-10-29 DIAGNOSIS — Z87891 Personal history of nicotine dependence: Secondary | ICD-10-CM | POA: Diagnosis not present

## 2019-10-29 DIAGNOSIS — Z0001 Encounter for general adult medical examination with abnormal findings: Secondary | ICD-10-CM | POA: Diagnosis not present

## 2019-10-29 DIAGNOSIS — J449 Chronic obstructive pulmonary disease, unspecified: Secondary | ICD-10-CM | POA: Diagnosis not present

## 2019-10-29 DIAGNOSIS — I4891 Unspecified atrial fibrillation: Secondary | ICD-10-CM | POA: Diagnosis not present

## 2019-10-29 DIAGNOSIS — G250 Essential tremor: Secondary | ICD-10-CM | POA: Diagnosis not present

## 2019-10-29 DIAGNOSIS — E782 Mixed hyperlipidemia: Secondary | ICD-10-CM | POA: Diagnosis not present

## 2019-11-29 ENCOUNTER — Ambulatory Visit: Payer: PPO | Admitting: Gastroenterology

## 2019-12-10 DIAGNOSIS — E782 Mixed hyperlipidemia: Secondary | ICD-10-CM | POA: Diagnosis not present

## 2019-12-10 DIAGNOSIS — I4891 Unspecified atrial fibrillation: Secondary | ICD-10-CM | POA: Diagnosis not present

## 2019-12-10 DIAGNOSIS — G250 Essential tremor: Secondary | ICD-10-CM | POA: Diagnosis not present

## 2019-12-10 DIAGNOSIS — J449 Chronic obstructive pulmonary disease, unspecified: Secondary | ICD-10-CM | POA: Diagnosis not present

## 2019-12-10 DIAGNOSIS — E119 Type 2 diabetes mellitus without complications: Secondary | ICD-10-CM | POA: Diagnosis not present

## 2019-12-10 DIAGNOSIS — Z87891 Personal history of nicotine dependence: Secondary | ICD-10-CM | POA: Diagnosis not present

## 2019-12-10 DIAGNOSIS — I10 Essential (primary) hypertension: Secondary | ICD-10-CM | POA: Diagnosis not present

## 2019-12-10 DIAGNOSIS — K3184 Gastroparesis: Secondary | ICD-10-CM | POA: Diagnosis not present

## 2019-12-10 DIAGNOSIS — R569 Unspecified convulsions: Secondary | ICD-10-CM | POA: Diagnosis not present

## 2019-12-23 DIAGNOSIS — M545 Low back pain: Secondary | ICD-10-CM | POA: Diagnosis not present

## 2019-12-23 DIAGNOSIS — Z5181 Encounter for therapeutic drug level monitoring: Secondary | ICD-10-CM | POA: Diagnosis not present

## 2019-12-23 DIAGNOSIS — M961 Postlaminectomy syndrome, not elsewhere classified: Secondary | ICD-10-CM | POA: Diagnosis not present

## 2019-12-23 DIAGNOSIS — G894 Chronic pain syndrome: Secondary | ICD-10-CM | POA: Diagnosis not present

## 2020-01-12 ENCOUNTER — Ambulatory Visit: Payer: PPO

## 2020-01-23 ENCOUNTER — Emergency Department: Payer: PPO

## 2020-01-23 ENCOUNTER — Other Ambulatory Visit: Payer: Self-pay

## 2020-01-23 ENCOUNTER — Observation Stay
Admission: EM | Admit: 2020-01-23 | Discharge: 2020-01-24 | Disposition: A | Discharge status: Home / Self Care | Payer: PPO | Attending: Internal Medicine | Admitting: Internal Medicine

## 2020-01-23 DIAGNOSIS — R748 Abnormal levels of other serum enzymes: Secondary | ICD-10-CM | POA: Diagnosis not present

## 2020-01-23 DIAGNOSIS — I160 Hypertensive urgency: Principal | ICD-10-CM | POA: Diagnosis present

## 2020-01-23 DIAGNOSIS — R0789 Other chest pain: Secondary | ICD-10-CM | POA: Diagnosis not present

## 2020-01-23 DIAGNOSIS — I480 Paroxysmal atrial fibrillation: Secondary | ICD-10-CM | POA: Diagnosis not present

## 2020-01-23 DIAGNOSIS — E1122 Type 2 diabetes mellitus with diabetic chronic kidney disease: Secondary | ICD-10-CM | POA: Diagnosis not present

## 2020-01-23 DIAGNOSIS — R079 Chest pain, unspecified: Secondary | ICD-10-CM | POA: Diagnosis not present

## 2020-01-23 DIAGNOSIS — R0902 Hypoxemia: Secondary | ICD-10-CM | POA: Diagnosis not present

## 2020-01-23 DIAGNOSIS — R000 Tachycardia, unspecified: Secondary | ICD-10-CM | POA: Insufficient documentation

## 2020-01-23 DIAGNOSIS — Z7982 Long term (current) use of aspirin: Secondary | ICD-10-CM | POA: Diagnosis not present

## 2020-01-23 DIAGNOSIS — E876 Hypokalemia: Secondary | ICD-10-CM | POA: Diagnosis not present

## 2020-01-23 DIAGNOSIS — N179 Acute kidney failure, unspecified: Secondary | ICD-10-CM | POA: Diagnosis present

## 2020-01-23 DIAGNOSIS — E119 Type 2 diabetes mellitus without complications: Secondary | ICD-10-CM | POA: Diagnosis not present

## 2020-01-23 DIAGNOSIS — R002 Palpitations: Secondary | ICD-10-CM | POA: Diagnosis not present

## 2020-01-23 DIAGNOSIS — I248 Other forms of acute ischemic heart disease: Secondary | ICD-10-CM | POA: Insufficient documentation

## 2020-01-23 DIAGNOSIS — I129 Hypertensive chronic kidney disease with stage 1 through stage 4 chronic kidney disease, or unspecified chronic kidney disease: Secondary | ICD-10-CM | POA: Insufficient documentation

## 2020-01-23 DIAGNOSIS — Z87891 Personal history of nicotine dependence: Secondary | ICD-10-CM | POA: Diagnosis not present

## 2020-01-23 DIAGNOSIS — R778 Other specified abnormalities of plasma proteins: Secondary | ICD-10-CM

## 2020-01-23 DIAGNOSIS — I2489 Other forms of acute ischemic heart disease: Secondary | ICD-10-CM

## 2020-01-23 DIAGNOSIS — E785 Hyperlipidemia, unspecified: Secondary | ICD-10-CM | POA: Diagnosis not present

## 2020-01-23 DIAGNOSIS — G2581 Restless legs syndrome: Secondary | ICD-10-CM | POA: Diagnosis not present

## 2020-01-23 DIAGNOSIS — Z20822 Contact with and (suspected) exposure to covid-19: Secondary | ICD-10-CM | POA: Diagnosis not present

## 2020-01-23 DIAGNOSIS — Z79891 Long term (current) use of opiate analgesic: Secondary | ICD-10-CM | POA: Insufficient documentation

## 2020-01-23 DIAGNOSIS — Z79899 Other long term (current) drug therapy: Secondary | ICD-10-CM | POA: Insufficient documentation

## 2020-01-23 DIAGNOSIS — N189 Chronic kidney disease, unspecified: Secondary | ICD-10-CM | POA: Diagnosis not present

## 2020-01-23 DIAGNOSIS — Z7984 Long term (current) use of oral hypoglycemic drugs: Secondary | ICD-10-CM | POA: Insufficient documentation

## 2020-01-23 DIAGNOSIS — J449 Chronic obstructive pulmonary disease, unspecified: Secondary | ICD-10-CM | POA: Diagnosis present

## 2020-01-23 DIAGNOSIS — R7989 Other specified abnormal findings of blood chemistry: Secondary | ICD-10-CM | POA: Diagnosis present

## 2020-01-23 DIAGNOSIS — F419 Anxiety disorder, unspecified: Secondary | ICD-10-CM | POA: Diagnosis not present

## 2020-01-23 DIAGNOSIS — I10 Essential (primary) hypertension: Secondary | ICD-10-CM | POA: Diagnosis not present

## 2020-01-23 DIAGNOSIS — E7800 Pure hypercholesterolemia, unspecified: Secondary | ICD-10-CM | POA: Diagnosis not present

## 2020-01-23 LAB — SARS CORONAVIRUS 2 (TAT 6-24 HRS): SARS Coronavirus 2: NEGATIVE

## 2020-01-23 LAB — BASIC METABOLIC PANEL
Anion gap: 11 (ref 5–15)
BUN: 31 mg/dL — ABNORMAL HIGH (ref 8–23)
CO2: 22 mmol/L (ref 22–32)
Calcium: 9.3 mg/dL (ref 8.9–10.3)
Chloride: 100 mmol/L (ref 98–111)
Creatinine, Ser: 1.38 mg/dL — ABNORMAL HIGH (ref 0.44–1.00)
GFR calc Af Amer: 45 mL/min — ABNORMAL LOW (ref >60)
GFR calc non Af Amer: 39 mL/min — ABNORMAL LOW (ref >60)
Glucose, Bld: 130 mg/dL — ABNORMAL HIGH (ref 70–99)
Potassium: 4.2 mmol/L (ref 3.5–5.1)
Sodium: 133 mmol/L — ABNORMAL LOW (ref 135–145)

## 2020-01-23 LAB — URINE DRUG SCREEN, QUALITATIVE (ARMC ONLY)
Amphetamines, Ur Screen: NOT DETECTED
Barbiturates, Ur Screen: NOT DETECTED
Benzodiazepine, Ur Scrn: NOT DETECTED
Cannabinoid 50 Ng, Ur ~~LOC~~: NOT DETECTED
Cocaine Metabolite,Ur ~~LOC~~: NOT DETECTED
MDMA (Ecstasy)Ur Screen: NOT DETECTED
Methadone Scn, Ur: NOT DETECTED
Opiate, Ur Screen: NOT DETECTED
Phencyclidine (PCP) Ur S: NOT DETECTED
Tricyclic, Ur Screen: POSITIVE — AB

## 2020-01-23 LAB — TROPONIN I (HIGH SENSITIVITY)
Troponin I (High Sensitivity): 9 ng/L (ref <18)
Troponin I (High Sensitivity): 48 ng/L — ABNORMAL HIGH (ref <18)
Troponin I (High Sensitivity): 201 ng/L (ref <18)
Troponin I (High Sensitivity): 253 ng/L (ref <18)
Troponin I (High Sensitivity): 301 ng/L (ref <18)

## 2020-01-23 LAB — CBC
HCT: 33.3 % — ABNORMAL LOW (ref 36.0–46.0)
Hemoglobin: 10.7 g/dL — ABNORMAL LOW (ref 12.0–15.0)
MCH: 29.4 pg (ref 26.0–34.0)
MCHC: 32.1 g/dL (ref 30.0–36.0)
MCV: 91.5 fL (ref 80.0–100.0)
Platelets: 239 10*3/uL (ref 150–400)
RBC: 3.64 MIL/uL — ABNORMAL LOW (ref 3.87–5.11)
RDW: 12.1 % (ref 11.5–15.5)
WBC: 8.6 10*3/uL (ref 4.0–10.5)
nRBC: 0 % (ref 0.0–0.2)

## 2020-01-23 LAB — GLUCOSE, CAPILLARY
Glucose-Capillary: 158 mg/dL — ABNORMAL HIGH (ref 70–99)
Glucose-Capillary: 203 mg/dL — ABNORMAL HIGH (ref 70–99)
Glucose-Capillary: 205 mg/dL — ABNORMAL HIGH (ref 70–99)

## 2020-01-23 LAB — BRAIN NATRIURETIC PEPTIDE: B Natriuretic Peptide: 38 pg/mL (ref 0.0–100.0)

## 2020-01-23 LAB — HEMOGLOBIN A1C
Hgb A1c MFr Bld: 7.1 % — ABNORMAL HIGH (ref 4.8–5.6)
Mean Plasma Glucose: 157.07 mg/dL

## 2020-01-23 MED ORDER — BACLOFEN 10 MG PO TABS
10.0000 mg | ORAL_TABLET | Freq: Two times a day (BID) | ORAL | Status: DC
  Administered 2020-01-23 – 2020-01-24 (×3): 10 mg via ORAL
  Filled 2020-01-23 (×4): qty 1

## 2020-01-23 MED ORDER — HYDRALAZINE HCL 20 MG/ML IJ SOLN: 5.0000 mg | INTRAMUSCULAR | Status: DC | PRN

## 2020-01-23 MED ORDER — AMLODIPINE BESYLATE 10 MG PO TABS
10.0000 mg | ORAL_TABLET | Freq: Every day | ORAL | Status: DC
  Administered 2020-01-24: 09:00:00 10 mg via ORAL
  Filled 2020-01-23: qty 1

## 2020-01-23 MED ORDER — AMLODIPINE BESYLATE 5 MG PO TABS
5.0000 mg | ORAL_TABLET | Freq: Once | ORAL | Status: AC
  Administered 2020-01-23: 06:00:00 5 mg via ORAL
  Filled 2020-01-23: qty 1

## 2020-01-23 MED ORDER — QUINAPRIL HCL 10 MG PO TABS
20.0000 mg | ORAL_TABLET | Freq: Every day | ORAL | Status: DC
  Filled 2020-01-23: qty 2

## 2020-01-23 MED ORDER — ZOLPIDEM TARTRATE 5 MG PO TABS: 5.0000 mg | ORAL_TABLET | Freq: Every evening | ORAL | Status: DC | PRN

## 2020-01-23 MED ORDER — TRIAMTERENE-HCTZ 37.5-25 MG PO TABS
1.0000 | ORAL_TABLET | Freq: Every day | ORAL | Status: DC
  Administered 2020-01-23: 07:00:00 1 via ORAL
  Filled 2020-01-23: qty 1

## 2020-01-23 MED ORDER — NITROGLYCERIN 0.4 MG SL SUBL: 0.4000 mg | SUBLINGUAL_TABLET | SUBLINGUAL | Status: DC | PRN

## 2020-01-23 MED ORDER — SODIUM CHLORIDE 0.9 % IV SOLN: INTRAVENOUS | Status: DC

## 2020-01-23 MED ORDER — AMLODIPINE BESYLATE 5 MG PO TABS
5.0000 mg | ORAL_TABLET | Freq: Once | ORAL | Status: DC
  Filled 2020-01-23: qty 1

## 2020-01-23 MED ORDER — FENOFIBRATE 54 MG PO TABS
54.0000 mg | ORAL_TABLET | Freq: Every day | ORAL | Status: DC
  Administered 2020-01-23 – 2020-01-24 (×2): 54 mg via ORAL
  Filled 2020-01-23 (×2): qty 1

## 2020-01-23 MED ORDER — ONDANSETRON HCL 4 MG/2ML IJ SOLN
4.0000 mg | Freq: Three times a day (TID) | INTRAMUSCULAR | Status: DC | PRN
  Administered 2020-01-23 – 2020-01-24 (×3): 4 mg via INTRAVENOUS
  Filled 2020-01-23 (×3): qty 2

## 2020-01-23 MED ORDER — ATORVASTATIN CALCIUM 20 MG PO TABS
40.0000 mg | ORAL_TABLET | Freq: Every day | ORAL | Status: DC
  Administered 2020-01-23: 40 mg via ORAL
  Filled 2020-01-23: qty 2

## 2020-01-23 MED ORDER — DM-GUAIFENESIN ER 30-600 MG PO TB12
1.0000 | ORAL_TABLET | Freq: Two times a day (BID) | ORAL | Status: DC
  Administered 2020-01-23 – 2020-01-24 (×2): 1 via ORAL
  Filled 2020-01-23 (×3): qty 1

## 2020-01-23 MED ORDER — ALPRAZOLAM 0.25 MG PO TABS
0.2500 mg | ORAL_TABLET | Freq: Three times a day (TID) | ORAL | Status: DC | PRN
  Administered 2020-01-23 – 2020-01-24 (×2): 0.25 mg via ORAL
  Filled 2020-01-23 (×2): qty 1

## 2020-01-23 MED ORDER — METOPROLOL TARTRATE 50 MG PO TABS
100.0000 mg | ORAL_TABLET | Freq: Two times a day (BID) | ORAL | Status: DC
  Administered 2020-01-23 – 2020-01-24 (×3): 100 mg via ORAL
  Filled 2020-01-23 (×3): qty 2

## 2020-01-23 MED ORDER — ASPIRIN 81 MG PO CHEW
81.0000 mg | CHEWABLE_TABLET | Freq: Every day | ORAL | Status: DC
  Administered 2020-01-23: 81 mg via ORAL
  Filled 2020-01-23 (×2): qty 1

## 2020-01-23 MED ORDER — VITAMIN D3 25 MCG (1000 UNIT) PO TABS
1000.0000 [IU] | ORAL_TABLET | Freq: Every day | ORAL | Status: DC
  Administered 2020-01-23 – 2020-01-24 (×2): 1000 [IU] via ORAL
  Filled 2020-01-23 (×4): qty 1

## 2020-01-23 MED ORDER — ACETAMINOPHEN 325 MG PO TABS: 650.0000 mg | ORAL_TABLET | Freq: Four times a day (QID) | ORAL | Status: DC | PRN

## 2020-01-23 MED ORDER — AMITRIPTYLINE HCL 25 MG PO TABS
25.0000 mg | ORAL_TABLET | Freq: Every day | ORAL | Status: DC
  Administered 2020-01-23: 25 mg via ORAL
  Filled 2020-01-23: qty 1

## 2020-01-23 MED ORDER — ALBUTEROL SULFATE (2.5 MG/3ML) 0.083% IN NEBU: 2.5000 mg | INHALATION_SOLUTION | Freq: Two times a day (BID) | RESPIRATORY_TRACT | Status: DC | PRN

## 2020-01-23 MED ORDER — HYDRALAZINE HCL 50 MG PO TABS
50.0000 mg | ORAL_TABLET | Freq: Three times a day (TID) | ORAL | Status: DC
  Administered 2020-01-23 – 2020-01-24 (×5): 50 mg via ORAL
  Filled 2020-01-23 (×5): qty 1

## 2020-01-23 MED ORDER — SODIUM CHLORIDE 0.9 % IV BOLUS
500.0000 mL | Freq: Once | INTRAVENOUS | Status: AC
  Administered 2020-01-23: 06:00:00 500 mL via INTRAVENOUS

## 2020-01-23 MED ORDER — INSULIN ASPART 100 UNIT/ML ~~LOC~~ SOLN
0.0000 [IU] | Freq: Every day | SUBCUTANEOUS | Status: DC
  Administered 2020-01-23: 21:00:00 2 [IU] via SUBCUTANEOUS
  Filled 2020-01-23: qty 1

## 2020-01-23 MED ORDER — ENOXAPARIN SODIUM 40 MG/0.4ML ~~LOC~~ SOLN
40.0000 mg | SUBCUTANEOUS | Status: DC
  Administered 2020-01-23 – 2020-01-24 (×2): 40 mg via SUBCUTANEOUS
  Filled 2020-01-23 (×2): qty 0.4

## 2020-01-23 MED ORDER — OXYCODONE-ACETAMINOPHEN 5-325 MG PO TABS
1.0000 | ORAL_TABLET | ORAL | Status: DC | PRN
  Administered 2020-01-23 – 2020-01-24 (×4): 1 via ORAL
  Filled 2020-01-23 (×4): qty 1

## 2020-01-23 MED ORDER — OXYCODONE HCL ER 10 MG PO T12A
20.0000 mg | EXTENDED_RELEASE_TABLET | Freq: Every day | ORAL | Status: DC
  Administered 2020-01-23: 20 mg via ORAL
  Filled 2020-01-23: qty 2

## 2020-01-23 MED ORDER — INSULIN ASPART 100 UNIT/ML ~~LOC~~ SOLN
0.0000 [IU] | Freq: Three times a day (TID) | SUBCUTANEOUS | Status: DC
  Administered 2020-01-23: 3 [IU] via SUBCUTANEOUS
  Administered 2020-01-23: 2 [IU] via SUBCUTANEOUS
  Administered 2020-01-24 (×2): 1 [IU] via SUBCUTANEOUS
  Administered 2020-01-24: 2 [IU] via SUBCUTANEOUS
  Filled 2020-01-23 (×5): qty 1

## 2020-01-23 MED ORDER — LEVETIRACETAM 500 MG PO TABS
500.0000 mg | ORAL_TABLET | Freq: Every day | ORAL | Status: DC
  Administered 2020-01-23: 500 mg via ORAL
  Filled 2020-01-23 (×2): qty 1

## 2020-01-23 NOTE — H&P (Addendum)
History and Physical    Nicole Pittman YWV:[id number] DOB: 1951-02-28 DOA: 01/23/2020  Referring MD/NP/PA:   PCP: Benito Mccreedy, MD   Patient coming from:  The patient is coming from home.  At baseline, pt is independent for most of ADL.        Chief Complaint: chest pain and elevated blood pressure  HPI: Nicole Pittman is a 69 y.o. female with medical history significant of hypertension, hyperlipidemia, diabetes mellitus, COPD, depression, back pain, PAF not on anticoagulants, colitis, RLS, who presents with chest pain and elevated blood pressure.  Patient states that her chest pain started at about 1:30 AM today.  It is located substernal area and left side of chest, pressure-like, moderate, nonradiating. Her chest pain lasted for about 10 minutes and subsided spontaneously. Her blood pressure is also elevated at 180s. Her Bp is192/61 at arrival to the ED.  Patient denies cough, shortness of breath, fever or chills.  She has nausea, no vomiting, diarrhea or abdominal pain.  No symptoms of UTI or unilateral weakness.  No facial droop or slurred speech.  ED Course: pt was found to have troponin 9 -->48, WBC 8.6, pending COVID-19 PCR, AKI with creatinine 1.38, BUN 31, temperature normal, blood pressure 192/61 --> 181/79 --> 167/67, negative chest x-ray.  Patient is placed on telemetry bed for observation.  Review of Systems:   General: no fevers, chills, no body weight gain, has poor appetite, has fatigue HEENT: no blurry vision, hearing changes or sore throat Respiratory: no dyspnea, coughing, wheezing CV: has chest pain, no palpitations GI: no nausea, vomiting, abdominal pain, diarrhea, constipation GU: no dysuria, burning on urination, increased urinary frequency, hematuria  Ext: no leg edema Neuro: no unilateral weakness, numbness, or tingling, no vision change or hearing loss Skin: no rash, no skin tear. MSK: No muscle spasm, no deformity, no limitation of range of movement  in spin Heme: No easy bruising.  Travel history: No recent long distant travel.  Allergy:  Allergies  Allergen Reactions  . Fentanyl Itching and Rash    PATCHES  . Codeine Hives  . Morphine And Related Hives  . Symbicort [Budesonide-Formoterol Fumarate] Other (See Comments)    Made patient's chest "hurt" and affected vision (blurred)  . Zanaflex [Tizanidine] Palpitations and Other (See Comments)    And worsened insomina    Past Medical History:  Diagnosis Date  . Adhesive arachnoiditis   . Arthritis   . Back problem    spurs  . COPD (chronic obstructive pulmonary disease) ([place])   . Diabetes ([place])   . High cholesterol   . Hypertension   . Neck problem    bad disc    Past Surgical History:  Procedure Laterality Date  . BACK SURGERY  1978   L5  . CHOLECYSTECTOMY  1992  . KNEE SURGERY Bilateral 1993  . OVARIAN CYST REMOVAL  1985  . TONSILLECTOMY  1959  . WRIST SURGERY Bilateral 2003    Social History:  reports that she has quit smoking. Her smoking use included cigarettes. She smoked 0.50 packs per day. She has never used smokeless tobacco. She reports that she does not drink alcohol or use drugs.  Family History:  Family History  Problem Relation Age of Onset  . Rheum arthritis Mother   . Hypertension Mother   . Parkinson's disease Mother   . Parkinsonism Mother   . Diabetes Father   . Breast cancer Maternal Aunt   . Lupus Maternal Aunt  Prior to Admission medications   Medication Sig Start Date End Date Taking? Authorizing Provider  amitriptyline (ELAVIL) 25 MG tablet Take 25 mg by mouth at bedtime.    [provider]  aspirin 81 MG tablet Take 81 mg by mouth daily with supper.     [provider]  atorvastatin (LIPITOR) 40 MG tablet Take 40 mg by mouth at bedtime.     [provider]  baclofen (LIORESAL) 10 MG tablet Take 10 mg by mouth 2 (two) times daily.  01/01/16   [provider]  Cholecalciferol (VITAMIN D-3 PO)  Take 1 capsule by mouth daily.    [provider]  EASY COMFORT LANCETS MISC 4 (four) times daily. for testing 12/14/15   [provider]  fenofibrate (TRICOR) 48 MG tablet Take 48 mg by mouth at bedtime.     [provider]  FREESTYLE LITE test strip 4 (four) times daily. for testing 12/14/15   [provider]  glipiZIDE-metformin (METAGLIP) 5-500 MG tablet Take 1 tablet by mouth 2 (two) times daily.  12/25/15   [provider]  levETIRAcetam (KEPPRA) 500 MG tablet Take 500 mg by mouth daily.    [provider]  loperamide (IMODIUM) 2 MG capsule Take 2 capsules (4 mg total) by mouth every 6 (six) hours. For diarrhea 08/07/19   Mayo, Allyn Kenner, MD  metoCLOPramide (REGLAN) 10 MG tablet Take 10 mg by mouth 4 (four) times daily.    [provider]  metoprolol tartrate (LOPRESSOR) 25 MG tablet Take 1 tablet (25 mg total) by mouth 2 (two) times daily. Patient taking differently: Take 100 mg by mouth 2 (two) times daily.  08/04/18   Jodelle Gross, NP  nitroGLYCERIN (NITROSTAT) 0.4 MG SL tablet Place 1 tablet (0.4 mg total) under the tongue every 5 (five) minutes as needed for chest pain. MAX 3 doses in 15 minutes 08/04/18 10/14/19  Jodelle Gross, NP  ondansetron (ZOFRAN) 4 MG tablet Take 1 tablet (4 mg total) by mouth every 6 (six) hours as needed for nausea. 08/07/19   Mayo, Allyn Kenner, MD  oxyCODONE (OXY IR/ROXICODONE) 5 MG immediate release tablet Take 5 mg by mouth 2 (two) times daily.  01/02/16   [provider]  OXYCONTIN 20 MG 12 hr tablet Take 20 mg by mouth daily with supper.  12/19/15   [provider]  PROAIR HFA 108 (90 Base) MCG/ACT inhaler Inhale 2 puffs into the lungs 2 (two) times daily.  01/03/16   [provider]  quinapril (ACCUPRIL) 20 MG tablet Take 20 mg by mouth daily. 01/12/16   [provider]  triamterene-hydrochlorothiazide (MAXZIDE-25) 37.5-25 MG tablet Take 1 tablet by mouth daily.  01/06/16   [provider]  zolpidem (AMBIEN) 10 MG tablet Take 10 mg by mouth daily. 12/29/15   [provider]    Physical Exam: Vitals:   01/23/20 0715 01/23/20 0730 01/23/20 0800 01/23/20 0846  BP: (!) 171/69 (!) 166/68 (!) 167/67 (!) 178/73  Pulse: 79 79 77 84  Resp: 16 15 16 16   Temp:    98.5 F (36.9 C)  TempSrc:    Oral  SpO2: 96% 94% 94% 98%  Weight:      Height:       General: Not in acute distress HEENT:       Eyes: PERRL, EOMI, no scleral icterus.       ENT: No discharge from the ears and nose, no pharynx injection, no tonsillar enlargement.  Neck: No JVD, no bruit, no mass felt. Heme: No neck lymph node enlargement. Cardiac: S1/S2, RRR, No murmurs, No gallops or rubs. Respiratory:  No rales, wheezing, rhonchi or rubs. GI: Soft, nondistended, nontender, no rebound pain, no organomegaly, BS present. GU: No hematuria Ext: No pitting leg edema bilaterally. 2+DP/PT pulse bilaterally. Musculoskeletal: No joint deformities, No joint redness or warmth, no limitation of ROM in spin. Skin: No rashes.  Neuro: Alert, oriented X3, cranial nerves II-XII grossly intact, moves all extremities normally.  Psych: Patient is not psychotic, no suicidal or hemocidal ideation.  Labs on Admission: I have personally reviewed following labs and imaging studies  CBC: Recent Labs  Lab 01/23/20 0319  WBC 8.6  HGB 10.7*  HCT 33.3*  MCV 91.5  PLT 239   Basic Metabolic Panel: Recent Labs  Lab 01/23/20 0319  NA 133*  K 4.2  CL 100  CO2 22  GLUCOSE 130*  BUN 31*  CREATININE 1.38*  CALCIUM 9.3   GFR: Estimated Creatinine Clearance: 31.4 mL/min (A) (by C-G formula based on SCr of 1.38 mg/dL (H)). Liver Function Tests: No results for input(s): AST, ALT, ALKPHOS, BILITOT, PROT, ALBUMIN in the last 168 hours. No results for input(s): LIPASE, AMYLASE in the last 168 hours. No results for input(s): AMMONIA in the last 168 hours. Coagulation Profile: No  results for input(s): INR, PROTIME in the last 168 hours. Cardiac Enzymes: No results for input(s): CKTOTAL, CKMB, CKMBINDEX, TROPONINI in the last 168 hours. BNP (last 3 results) No results for input(s): PROBNP in the last 8760 hours. HbA1C: No results for input(s): HGBA1C in the last 72 hours. CBG: No results for input(s): GLUCAP in the last 168 hours. Lipid Profile: No results for input(s): CHOL, HDL, LDLCALC, TRIG, CHOLHDL, LDLDIRECT in the last 72 hours. Thyroid Function Tests: No results for input(s): TSH, T4TOTAL, FREET4, T3FREE, THYROIDAB in the last 72 hours. Anemia Panel: No results for input(s): VITAMINB12, FOLATE, FERRITIN, TIBC, IRON, RETICCTPCT in the last 72 hours. Urine analysis:    Component Value Date/Time   COLORURINE YELLOW (A) 08/04/2019 0815   APPEARANCEUR HAZY (A) 08/04/2019 0815   LABSPEC 1.017 08/04/2019 0815   PHURINE 6.0 08/04/2019 0815   GLUCOSEU NEGATIVE 08/04/2019 0815   HGBUR NEGATIVE 08/04/2019 0815   BILIRUBINUR NEGATIVE 08/04/2019 0815   KETONESUR 20 (A) 08/04/2019 0815   PROTEINUR NEGATIVE 08/04/2019 0815   NITRITE NEGATIVE 08/04/2019 0815   LEUKOCYTESUR SMALL (A) 08/04/2019 0815   Sepsis Labs: @LABRCNTIP (procalcitonin:4,lacticidven:4) )No results found for this or any previous visit (from the past 240 hour(s)).   Radiological Exams on Admission: DG Chest 2 View  Result Date: 01/23/2020 CLINICAL DATA:  Hypertension, chest pain EXAM: CHEST - 2 VIEW COMPARISON:  07/08/2018 FINDINGS: Frontal and lateral views of the chest demonstrate an unremarkable cardiac silhouette. No airspace disease, effusion, or pneumothorax. No acute bony abnormality. IMPRESSION: 1. No acute intrathoracic process. Electronically Signed   By: 07/10/2018 M.D.   On: 01/23/2020 03:40     EKG: Independently reviewed.  Sinus rhythm, QTC 437, low voltage, poor R wave progression   Assessment/Plan Principal Problem:   Hypertensive urgency Active Problems:   HTN  (hypertension)   HLD (hyperlipidemia)   COPD (chronic obstructive pulmonary disease) (HCC)   Paroxysmal atrial fibrillation (HCC)   Diabetes mellitus without complication (HCC)   AKI (acute kidney injury) (HCC)   Chest pain   RLS (restless legs syndrome)   Elevated troponin   Hypertensive urgency: blood pressure 192/61 --> 181/79 -->  167/67. Pt was started home meds in ED, including amlodipine, metoprolol, quinapril, Maxzide.  Due to AKI, will discontinue quinapril and Maxide.    -will place on tele bed for obs -Will increase the dose of amlodipine from 5 to 10 mg daily and add hydralazine 50 mg 3 times daily -hold quinapril and Maxide -Continue metoprolol -As needed hydralazine IV  Addendum: trop is trending up from 9 -->48 -->201 -consulted Card, Dr. Mariah Milling  HTN (hypertension): -see above  Elevated trop and chest pain: trop 31. Chest pain has resolved.  Likely due to demand ischemia secondary to hypertensive urgency -Trend troponin -Check A1c, FLP, UDS -Get 2D echo -Repeat EKG in morning -If troponin is trending up, will consult cardiology -Aspirin, Lipitor, metoprolol -As needed nitroglycerin and Percocet  HLD (hyperlipidemia): -lipitor  COPD (chronic obstructive pulmonary disease) (HCC): stable -Bronchodilators  Paroxysmal atrial fibrillation (HCC): No on anticoagulants at home.  Heart rate well controlled, 81 currently -Continue metoprolol  Diabetes mellitus without complication (HCC): Most recent A1c 7.0. Patient is taking Metaglip at home -SSI  AKI (acute kidney injury) [facility]): Cre 1.38 and BUN 31. Likely due to dehydration and continuation of ARB, diuretics - IVF: 500 cc of NS, then 75 cc/h - Follow up renal function by BMP - Avoid using renal toxic medications, hypotension and contrast dye (or carefully use) - Hold quinapril and Maxzide  RLS (restless legs syndrome) -keppra    DVT ppx: SQ Lovenox Code Status: Full code Family Communication: not done,  no family member is at bed side.     Disposition Plan:  Anticipate discharge back to previous home environment Consults called:  Card, Dr.Gollan Admission status:  Tele bed for obs  Date of Service 01/23/2020    Lorretta Harp [facility]   If 7PM-7AM, please contact night-coverage [email or web address] 01/23/2020, 8:47 AM

## 2020-01-23 NOTE — ED Triage Notes (Signed)
Patient coming [facility] from home for hypertension and chest pain. Chest pain has since resolved. Patient hypertensive for EMS 190/90. Patient has hx heart block and afib.  190/90, 72 bpm, 18RR, 99% on RA, 98.4 temp.

## 2020-01-23 NOTE — Consult Note (Signed)
Cardiology Consultation:   Patient ID: Nicole Pittman MRN: [id number]; DOB: 1951/06/22  Admit date: 01/23/2020 Date of Consult: 01/23/2020  Primary Care Provider: Jackie Plum, MD Primary Cardiologist: Nicole Nose, MD , [facility] Physician requesting consult: Dr. Lorretta Harp Reason for consult: Chest pain, elevated troponin, hypertension   Patient Profile:   Nicole Pittman is a 69 y.o. female with a hx of  COPD, diabetes, hypertension, hyperlipidemia , gastric motility disorder , previous esophageal dilatation, who presents the hospital with brief chest discomfort, hypertension   History of Present Illness:   [name] reports history of anxiety/panic attacks, prior episodes of chest pain, labile hypertension  Reports that yesterday evening she developed a throbbing in her ear and she took her blood pressure and it was in the 180s Typically her baseline blood pressure is well controlled  Reports that she had some chest pain on the left side of her chest that was constant, minimal in nature, got better on its own and went away completely after about 10 minutes.   Typically does not have chest pain on exertion  Bp is192/61 at arrival to the ED. TNT 9, 8, up to 200 on repeat  Reports having similar symptoms in 2019, had thorough work-up at that time including echocardiogram, cardiac CTA Similar spike in blood pressure  coronary artery CT scanning 09/2018 1. Coronary calcium score of 12. This was 24 percentile for age and sex matched control. 2. Normal coronary origin with right dominance. 3. Minimal non-obstructive CAD. Aggressive risk factor modification is recommended.  CT ABD 07/2019 Mild to moderate Aortic atherosclerosis  Normal LVEF by echo (08/2018)    Heart Pathway Score:     Past Medical History:  Diagnosis Date  . Adhesive arachnoiditis   . Arthritis   . Back problem    spurs  . COPD (chronic obstructive pulmonary disease) (HCC)   . Diabetes (HCC)     . High cholesterol   . Hypertension   . Neck problem    bad disc    Past Surgical History:  Procedure Laterality Date  . BACK SURGERY  1978   L5  . CHOLECYSTECTOMY  1992  . KNEE SURGERY Bilateral 1993  . OVARIAN CYST REMOVAL  1985  . TONSILLECTOMY  1959  . WRIST SURGERY Bilateral 2003     Home Medications:  Prior to Admission medications   Medication Sig Start Date End Date Taking? Authorizing Provider  amitriptyline (ELAVIL) 25 MG tablet Take 25 mg by mouth at bedtime.   Yes [provider]  amLODipine (NORVASC) 5 MG tablet Take 5 mg by mouth daily. 01/20/20  Yes [provider]  aspirin 81 MG tablet Take 81 mg by mouth daily with supper.    Yes [provider]  atorvastatin (LIPITOR) 40 MG tablet Take 40 mg by mouth at bedtime.    Yes [provider]  baclofen (LIORESAL) 10 MG tablet Take 10 mg by mouth 2 (two) times daily.  01/01/16  Yes [provider]  Cholecalciferol (VITAMIN D-3 PO) Take 1 capsule by mouth daily.   Yes [provider]  fenofibrate (TRICOR) 48 MG tablet Take 48 mg by mouth at bedtime.    Yes [provider]  glipiZIDE-metformin (METAGLIP) 5-500 MG tablet Take 1 tablet by mouth 2 (two) times daily.  12/25/15  Yes [provider]  levETIRAcetam (KEPPRA) 500 MG tablet Take 500 mg by mouth daily.   Yes [provider]  metoprolol tartrate (LOPRESSOR) 100 MG tablet  Take 100 mg by mouth 2 (two) times daily. 11/08/19  Yes [provider]  oxyCODONE (OXY IR/ROXICODONE) 5 MG immediate release tablet Take 5 mg by mouth 2 (two) times daily.  01/02/16  Yes [provider]  OXYCONTIN 20 MG 12 hr tablet Take 20 mg by mouth daily with supper.  12/19/15  Yes [provider]  quinapril (ACCUPRIL) 20 MG tablet Take 20 mg by mouth daily. 01/12/16  Yes [provider]  triamterene-hydrochlorothiazide (MAXZIDE-25) 37.5-25 MG tablet Take 1 tablet by mouth daily. 01/06/16   Yes [provider]  zolpidem (AMBIEN) 10 MG tablet Take 10 mg by mouth daily. 12/29/15  Yes [provider]  EASY COMFORT LANCETS MISC 4 (four) times daily. for testing 12/14/15   [provider]  FREESTYLE LITE test strip 4 (four) times daily. for testing 12/14/15   [provider]  loperamide (IMODIUM) 2 MG capsule Take 2 capsules (4 mg total) by mouth every 6 (six) hours. For diarrhea 08/07/19   Mayo, Pete Pelt, MD  metoCLOPramide (REGLAN) 10 MG tablet Take 10 mg by mouth 4 (four) times daily as needed for nausea or vomiting.     [provider]  nitroGLYCERIN (NITROSTAT) 0.4 MG SL tablet Place 1 tablet (0.4 mg total) under the tongue every 5 (five) minutes as needed for chest pain. MAX 3 doses in 15 minutes 08/04/18 10/14/19  Lendon Colonel, NP  ondansetron (ZOFRAN) 4 MG tablet Take 1 tablet (4 mg total) by mouth every 6 (six) hours as needed for nausea. 08/07/19   Mayo, Pete Pelt, MD  PROAIR HFA 108 [phone] Base) MCG/ACT inhaler Inhale 2 puffs into the lungs 2 (two) times daily as needed for wheezing or shortness of breath.  01/03/16   [provider]    Inpatient Medications: Scheduled Meds: . [START ON 01/24/2020] amLODipine  10 mg Oral Daily  . amLODipine  5 mg Oral Once  . dextromethorphan-guaiFENesin  1 tablet Oral BID  . enoxaparin (LOVENOX) injection  40 mg Subcutaneous Q24H  . hydrALAZINE  50 mg Oral Q8H  . insulin aspart  0-5 Units Subcutaneous QHS  . insulin aspart  0-9 Units Subcutaneous TID WC  . metoprolol tartrate  100 mg Oral BID   Continuous Infusions: . sodium chloride     PRN Meds: acetaminophen, hydrALAZINE, nitroGLYCERIN, ondansetron (ZOFRAN) IV, oxyCODONE-acetaminophen  Allergies:    Allergies  Allergen Reactions  . Fentanyl Itching and Rash    PATCHES  . Codeine Hives  . Morphine And Related Hives  . Symbicort [Budesonide-Formoterol Fumarate] Other (See Comments)    Made patient's chest "hurt" and affected  vision (blurred)  . Zanaflex [Tizanidine] Palpitations and Other (See Comments)    And worsened insomina    Social History:   Social History   Socioeconomic History  . Marital status: Married    Spouse name: Kasandra Knudsen  . Number of children: 4  . Years of education: 70  . Highest education level: Not on file  Occupational History  . Occupation: Retired  Tobacco Use  . Smoking status: Former Smoker    Packs/day: 0.50    Types: Cigarettes  . Smokeless tobacco: Never Used  Substance and Sexual Activity  . Alcohol use: No    Alcohol/week: 0.0 standard drinks  . Drug use: No  . Sexual activity: Not on file  Other Topics Concern  . Not on file  Social History Narrative   Lives w/ husband and sons   Caffeine use: none  Social Determinants of Health   Financial Resource Strain:   . Difficulty of Paying Living Expenses: Not on file  Food Insecurity:   . Worried About Programme researcher, broadcasting/film/video in the Last Year: Not on file  . Ran Out of Food in the Last Year: Not on file  Transportation Needs:   . Lack of Transportation (Medical): Not on file  . Lack of Transportation (Non-Medical): Not on file  Physical Activity:   . Days of Exercise per Week: Not on file  . Minutes of Exercise per Session: Not on file  Stress:   . Feeling of Stress : Not on file  Social Connections:   . Frequency of Communication with Friends and Family: Not on file  . Frequency of Social Gatherings with Friends and Family: Not on file  . Attends Religious Services: Not on file  . Active Member of Clubs or Organizations: Not on file  . Attends Banker Meetings: Not on file  . Marital Status: Not on file  Intimate Partner Violence:   . Fear of Current or Ex-Partner: Not on file  . Emotionally Abused: Not on file  . Physically Abused: Not on file  . Sexually Abused: Not on file    Family History:    Family History  Problem Relation Age of Onset  . Rheum arthritis Mother   . Hypertension  Mother   . Parkinson's disease Mother   . Parkinsonism Mother   . Diabetes Father   . Breast cancer Maternal Aunt   . Lupus Maternal Aunt      ROS:  Please see the history of present illness.  Review of Systems  Constitutional: Negative.   HENT: Negative.   Respiratory: Negative.   Cardiovascular: Positive for chest pain.  Gastrointestinal: Negative.   Musculoskeletal: Negative.   Neurological: Negative.   Psychiatric/Behavioral: The patient is nervous/anxious.   All other systems reviewed and are negative.   Physical Exam/Data:   Vitals:   01/23/20 0730 01/23/20 0800 01/23/20 0846 01/23/20 1210  BP: (!) 166/68 (!) 167/67 (!) 178/73 (!) 144/62  Pulse: 79 77 84 79  Resp: 15 16 16 16   Temp:   98.5 F (36.9 C) 98.3 F (36.8 C)  TempSrc:   Oral Oral  SpO2: 94% 94% 98% 96%  Weight:      Height:        Intake/Output Summary (Last 24 hours) at 01/23/2020 1254 Last data filed at 01/23/2020 1048 Gross per 24 hour  Intake 500 ml  Output 500 ml  Net 0 ml   Last 3 Weights 01/23/2020 10/14/2019 08/04/2019  Weight (lbs) 129 lb 131 lb 129 lb  Weight (kg) 58.514 kg 59.421 kg 58.514 kg     Body mass index is 22.85 kg/m.  General:  Well nourished, well developed, in no acute distress HEENT: normal Lymph: no adenopathy Neck: no JVD Endocrine:  No thryomegaly Vascular: No carotid bruits; FA pulses 2+ bilaterally without bruits  Cardiac:  normal S1, S2; RRR; no murmur  Lungs:  clear to auscultation bilaterally, no wheezing, rhonchi or rales  Abd: soft, nontender, no hepatomegaly  Ext: no edema Musculoskeletal:  No deformities, BUE and BLE strength normal and equal Skin: warm and dry  Neuro:  CNs 2-12 intact, no focal abnormalities noted Psych:  Normal affect   EKG:  The EKG was personally reviewed and demonstrates:   Normal sinus rhythm rate 84 bpm no significant ST-T wave changes  Telemetry:  Telemetry was personally reviewed and demonstrates:  Normal sinus  rhythm  Relevant CV Studies: Echocardiogram pending  Laboratory Data:  High Sensitivity Troponin:   Recent Labs  Lab 01/23/20 0319 01/23/20 0516 01/23/20 0848 01/23/20 1033  TROPONINIHS 9 48* 201* 253*     Chemistry Recent Labs  Lab 01/23/20 0319  NA 133*  K 4.2  CL 100  CO2 22  GLUCOSE 130*  BUN 31*  CREATININE 1.38*  CALCIUM 9.3  GFRNONAA 39*  GFRAA 45*  ANIONGAP 11    No results for input(s): PROT, ALBUMIN, AST, ALT, ALKPHOS, BILITOT in the last 168 hours. Hematology Recent Labs  Lab 01/23/20 0319  WBC 8.6  RBC 3.64*  HGB 10.7*  HCT 33.3*  MCV 91.5  MCH 29.4  MCHC 32.1  RDW 12.1  PLT 239   BNP Recent Labs  Lab 01/23/20 0319  BNP 38.0    DDimer No results for input(s): DDIMER in the last 168 hours.   Radiology/Studies:  DG Chest 2 View  Result Date: 01/23/2020 CLINICAL DATA:  Hypertension, chest pain EXAM: CHEST - 2 VIEW COMPARISON:  07/08/2018 FINDINGS: Frontal and lateral views of the chest demonstrate an unremarkable cardiac silhouette. No airspace disease, effusion, or pneumothorax. No acute bony abnormality. IMPRESSION: 1. No acute intrathoracic process. Electronically Signed   By: Sharlet Salina M.D.   On: 01/23/2020 03:40   {   Assessment and Plan:   1. Chest pain/demand ischemia Atypical in nature, possibly exacerbated by anxiety/panic attack Similar episode 2019 with thorough work-up at that time including echocardiogram and cardiac CTA, calcium score 12, minimal coronary calcification that time -Symptoms lasting 10 minutes, presenting at rest, resolved on their own without intervention Troponin up to 200 in the setting of hypertensive urgency -Echocardiogram has been ordered, pending --No further ischemic work-up at this time  2.  Labile hypertension Reports typically well controlled Rarely will have spikes in her pressure as seen in 2019, similar to this admission -Felt thumping in her ear, blood pressure elevated, she reports  got up to over 200 systolic -Typically well controlled 120 systolic -She may benefit from Xanax to take as needed, discussion with primary care concerning her anxiety/panic attacks ----Plan for acute hypertensive urgency in the future, she could take sublingual nitroglycerin, extra amlodipine  3.  Anxiety/history of panic attacks Likely leading to spikes in her hypertension Plan as above, recommend follow-up with primary care, she may benefit from Xanax as needed  4.  Acute renal insufficiency Creatinine 1.38, BUN 31 Suspect secondary to HCTZ Currently on IV fluids, Once renal function normalized would restart her ACE/quinapril  5.  Diabetes type 2 Hemoglobin A1c 7.1 Continue outpatient medications   Total encounter time more than 110 minutes  Greater than 50% was spent in counseling and coordination of care with the patient   For questions or updates, please contact [facility] Please consult [email or web address] for contact info under     Signed, Julien Nordmann, MD  01/23/2020 12:54 PM

## 2020-01-23 NOTE — Progress Notes (Signed)
Cardiology approves heart healthy carb mod diet. I will continue to assess.

## 2020-01-23 NOTE — ED Provider Notes (Signed)
[facility] Emergency Department Provider Note  ____________________________________________   First MD Initiated Contact with Patient 01/23/20 [phone]     (approximate)  I have reviewed the triage vital signs and the nursing notes.   HISTORY  Chief Complaint Chest Pain and Hypertension    HPI Nicole Pittman is a 69 y.o. female with COPD, diabetes, hypertension, hyperlipidemia who comes in with chest pain and high blood pressure.  Patient states that she started feeling a throbbing in her ear and she took her blood pressure and it was in the 180s which is elevated for her.  Patient states that she is been compliant with all of her blood pressure medications although they are now due.  Patient states that when the blood pressure was high she had some chest pain on the left side of her chest that was constant, minimal in nature, got better on its own and went away completely after about 10 minutes.  Denies any chest pain or shortness of breath at this time.  Denies any leg swelling.  Feels close to her baseline at this time.              Past Medical History:  Diagnosis Date  . Adhesive arachnoiditis   . Arthritis   . Back problem    spurs  . COPD (chronic obstructive pulmonary disease) (HCC)   . Diabetes (HCC)   . High cholesterol   . Hypertension   . Neck problem    bad disc    Patient Active Problem List   Diagnosis Date Noted  . Colitis 08/04/2019  . Paroxysmal atrial fibrillation (HCC)   . Chest pain, precordial 07/08/2018  . Tremor of face and hands 01/21/2016  . HTN (hypertension) 01/21/2016  . HLD (hyperlipidemia) 01/21/2016  . Diabetes (HCC) 01/21/2016  . COPD (chronic obstructive pulmonary disease) (HCC) 01/21/2016    Past Surgical History:  Procedure Laterality Date  . BACK SURGERY  1978   L5  . CHOLECYSTECTOMY  1992  . KNEE SURGERY Bilateral 1993  . OVARIAN CYST REMOVAL  1985  . TONSILLECTOMY  1959  . WRIST SURGERY  Bilateral 2003    Prior to Admission medications   Medication Sig Start Date End Date Taking? Authorizing Provider  amitriptyline (ELAVIL) 25 MG tablet Take 25 mg by mouth at bedtime.    [provider]  aspirin 81 MG tablet Take 81 mg by mouth daily with supper.     [provider]  atorvastatin (LIPITOR) 40 MG tablet Take 40 mg by mouth at bedtime.     [provider]  baclofen (LIORESAL) 10 MG tablet Take 10 mg by mouth 2 (two) times daily.  01/01/16   [provider]  Cholecalciferol (VITAMIN D-3 PO) Take 1 capsule by mouth daily.    [provider]  EASY COMFORT LANCETS MISC 4 (four) times daily. for testing 12/14/15   [provider]  fenofibrate (TRICOR) 48 MG tablet Take 48 mg by mouth at bedtime.     [provider]  FREESTYLE LITE test strip 4 (four) times daily. for testing 12/14/15   [provider]  glipiZIDE-metformin (METAGLIP) 5-500 MG tablet Take 1 tablet by mouth 2 (two) times daily.  12/25/15   [provider]  levETIRAcetam (KEPPRA) 500 MG tablet Take 500 mg by mouth daily.    [provider]  loperamide (IMODIUM) 2 MG capsule Take 2 capsules (4 mg total) by mouth every 6 (six) hours. For diarrhea 08/07/19  Mayo, Allyn Kenner, MD  metoCLOPramide (REGLAN) 10 MG tablet Take 10 mg by mouth 4 (four) times daily.    [provider]  metoprolol tartrate (LOPRESSOR) 25 MG tablet Take 1 tablet (25 mg total) by mouth 2 (two) times daily. Patient taking differently: Take 100 mg by mouth 2 (two) times daily.  08/04/18   Jodelle Gross, NP  nitroGLYCERIN (NITROSTAT) 0.4 MG SL tablet Place 1 tablet (0.4 mg total) under the tongue every 5 (five) minutes as needed for chest pain. MAX 3 doses in 15 minutes 08/04/18 10/14/19  Jodelle Gross, NP  ondansetron (ZOFRAN) 4 MG tablet Take 1 tablet (4 mg total) by mouth every 6 (six) hours as needed for nausea. 08/07/19   Mayo, Allyn Kenner, MD  oxyCODONE  (OXY IR/ROXICODONE) 5 MG immediate release tablet Take 5 mg by mouth 2 (two) times daily.  01/02/16   [provider]  OXYCONTIN 20 MG 12 hr tablet Take 20 mg by mouth daily with supper.  12/19/15   [provider]  PROAIR HFA 108 (90 Base) MCG/ACT inhaler Inhale 2 puffs into the lungs 2 (two) times daily.  01/03/16   [provider]  quinapril (ACCUPRIL) 20 MG tablet Take 20 mg by mouth daily. 01/12/16   [provider]  triamterene-hydrochlorothiazide (MAXZIDE-25) 37.5-25 MG tablet Take 1 tablet by mouth daily. 01/06/16   [provider]  zolpidem (AMBIEN) 10 MG tablet Take 10 mg by mouth daily. 12/29/15   [provider]    Allergies Fentanyl, Codeine, Morphine and related, Symbicort [budesonide-formoterol fumarate], and Zanaflex [tizanidine]  Family History  Problem Relation Age of Onset  . Rheum arthritis Mother   . Hypertension Mother   . Parkinson's disease Mother   . Parkinsonism Mother   . Diabetes Father   . Breast cancer Maternal Aunt   . Lupus Maternal Aunt     Social History Social History   Tobacco Use  . Smoking status: Former Smoker    Packs/day: 0.50    Types: Cigarettes  . Smokeless tobacco: Never Used  Substance Use Topics  . Alcohol use: No    Alcohol/week: 0.0 standard drinks  . Drug use: No      Review of Systems Constitutional: No fever/chills Eyes: No visual changes. ENT: No sore throat. Cardiovascular: Positive chest pain Respiratory: Denies shortness of breath. Gastrointestinal: No abdominal pain.  No nausea, no vomiting.  No diarrhea.  No constipation. Genitourinary: Negative for dysuria. Musculoskeletal: Negative for back pain. Skin: Negative for rash. Neurological: Negative for headaches, focal weakness or numbness. All other ROS negative ____________________________________________   PHYSICAL EXAM:  VITAL SIGNS: ED Triage Vitals  Enc Vitals Group     BP 01/23/20 0316 (!) 192/61      Pulse Rate 01/23/20 0316 84     Resp 01/23/20 0316 19     Temp 01/23/20 0316 98.8 F (37.1 C)     Temp Source 01/23/20 0517 Oral     SpO2 01/23/20 0316 97 %     Weight 01/23/20 0317 129 lb (58.5 kg)     Height 01/23/20 0317 5\' 3"  (1.6 m)     Head Circumference --      Peak Flow --      Pain Score 01/23/20 0316 0     Pain Loc --      Pain Edu? --      Excl. in GC? --     Constitutional: Alert and oriented. Well appearing and in no acute  distress. Eyes: Conjunctivae are normal. EOMI. Head: Atraumatic. Nose: No congestion/rhinnorhea. Mouth/Throat: Mucous membranes are moist.   Neck: No stridor. Trachea Midline. FROM Cardiovascular: Normal rate, regular rhythm. Grossly normal heart sounds.  Good peripheral circulation. Respiratory: Normal respiratory effort.  No retractions. Lungs CTAB. Gastrointestinal: Soft and nontender. No distention. No abdominal bruits.  Musculoskeletal: No lower extremity tenderness nor edema.  No joint effusions. Neurologic:  Normal speech and language. No gross focal neurologic deficits are appreciated.  Skin:  Skin is warm, dry and intact. No rash noted. Psychiatric: Mood and affect are normal. Speech and behavior are normal. GU: Deferred   ____________________________________________   LABS (all labs ordered are listed, but only abnormal results are displayed)  Labs Reviewed  BASIC METABOLIC PANEL - Abnormal; Notable for the following components:      Result Value   Sodium 133 (*)    Glucose, Bld 130 (*)    BUN 31 (*)    Creatinine, Ser 1.38 (*)    GFR calc non Af Amer 39 (*)    GFR calc Af Amer 45 (*)    All other components within normal limits  CBC - Abnormal; Notable for the following components:   RBC 3.64 (*)    Hemoglobin 10.7 (*)    HCT 33.3 (*)    All other components within normal limits  TROPONIN I (HIGH SENSITIVITY) - Abnormal; Notable for the following components:   Troponin I (High Sensitivity) 48 (*)    All other components  within normal limits  TROPONIN I (HIGH SENSITIVITY)   ____________________________________________   ED ECG REPORT I, Concha Se, the attending physician, personally viewed and interpreted this ECG.  EKG is normal sinus rate of 84, no ST elevation, no T wave inversions, normal intervals ____________________________________________  RADIOLOGY Vela Prose, personally viewed and evaluated these images (plain radiographs) as part of my medical decision making, as well as reviewing the written report by the radiologist.  ED MD interpretation: No pneumonia  Official radiology report(s): DG Chest 2 View  Result Date: 01/23/2020 CLINICAL DATA:  Hypertension, chest pain EXAM: CHEST - 2 VIEW COMPARISON:  07/08/2018 FINDINGS: Frontal and lateral views of the chest demonstrate an unremarkable cardiac silhouette. No airspace disease, effusion, or pneumothorax. No acute bony abnormality. IMPRESSION: 1. No acute intrathoracic process. Electronically Signed   By: Sharlet Salina M.D.   On: 01/23/2020 03:40    ____________________________________________   PROCEDURES  Procedure(s) performed (including Critical Care):  Procedures   ____________________________________________   INITIAL IMPRESSION / ASSESSMENT AND PLAN / ED COURSE   Nicole Pittman was evaluated in Emergency Department on 01/23/2020 for the symptoms described in the history of present illness. She was evaluated in the context of the global COVID-19 pandemic, which necessitated consideration that the patient might be at risk for infection with the SARS-CoV-2 virus that causes COVID-19. Institutional protocols and algorithms that pertain to the evaluation of patients at risk for COVID-19 are in a state of rapid change based on information released by regulatory bodies including the CDC and federal and state organizations. These policies and algorithms were followed during the patient's care in the ED.    Most Likely DDx:    -MSK (atypical chest pain) but will get cardiac markers to evaluate for ACS given risk factors/age   DDx that was also considered d/t potential to cause harm, but was found less likely based on history and physical (as detailed above): -PNA (no fevers, cough but CXR to evaluate) -  PNX (reassured with equal b/l breath sounds, CXR to evaluate) -Symptomatic anemia (will get H&H) -Pulmonary embolism as no sob at rest, not pleuritic in nature, no hypoxia -Aortic Dissection as no tearing pain and no radiation to the mid back, pulses equal -Pericarditis no rub on exam, EKG changes or hx to suggest dx -Tamponade (no notable SOB, tachycardic, hypotensive) -Esophageal rupture (no h/o diffuse vomitting/no crepitus)  Kidney function is 1.3 a up from her baseline.  Will give some fluids.  Hemoglobin is around her baseline.  May be slightly lower but patient denies any GI bleeding symptoms  No white count elevation.  Chest x-ray negative for pneumonia   Troponin went from 9-48.  Discussed with patient that this rise could be from her elevated blood pressure and that with the elevated kidney function that it would be best to admit her to the hospital for trending these and further evaluation.  Patient was comfortable with this plan  We will give her her home morning medications that she is due for this morning.       ____________________________________________   FINAL CLINICAL IMPRESSION(S) / ED DIAGNOSES   Final diagnoses:  AKI (acute kidney injury) ([place])  Hypertensive urgency  Troponin level elevated     MEDICATIONS GIVEN DURING THIS VISIT:  Medications  sodium chloride 0.9 % bolus 500 mL (has no administration in time range)  metoprolol tartrate (LOPRESSOR) tablet 100 mg (100 mg Oral Given 01/23/20 0609)  quinapril (ACCUPRIL) tablet 20 mg (has no administration in time range)  triamterene-hydrochlorothiazide (MAXZIDE-25) 37.5-25 MG per tablet 1 tablet (has no administration in  time range)  amLODipine (NORVASC) tablet 5 mg (5 mg Oral Given 01/23/20 2353)     ED Discharge Orders    None       Note:  This document was prepared using Dragon voice recognition software and may include unintentional dictation errors.   Vanessa Nicolaus, MD 01/23/20 [id number]

## 2020-01-23 NOTE — ED Notes (Signed)
Primary RN notified of pt arrival in room. Call bell provided to pt.

## 2020-01-23 NOTE — Progress Notes (Signed)
Pts troponin level is elevated. MD notified. Cardiology consult placed. I will continue to assess.

## 2020-01-23 NOTE — ED Notes (Signed)
Pt sitting in lobby in wheelchair in no acute distress.  

## 2020-01-24 ENCOUNTER — Observation Stay (HOSPITAL_BASED_OUTPATIENT_CLINIC_OR_DEPARTMENT_OTHER)
Admit: 2020-01-24 | Discharge: 2020-01-24 | Disposition: A | Discharge status: Home / Self Care | Payer: PPO | Attending: Cardiovascular Disease | Admitting: Cardiovascular Disease

## 2020-01-24 ENCOUNTER — Other Ambulatory Visit: Payer: Self-pay

## 2020-01-24 DIAGNOSIS — R079 Chest pain, unspecified: Secondary | ICD-10-CM | POA: Diagnosis not present

## 2020-01-24 DIAGNOSIS — I248 Other forms of acute ischemic heart disease: Secondary | ICD-10-CM | POA: Diagnosis not present

## 2020-01-24 DIAGNOSIS — I160 Hypertensive urgency: Secondary | ICD-10-CM | POA: Diagnosis not present

## 2020-01-24 LAB — BASIC METABOLIC PANEL
Anion gap: 11 (ref 5–15)
BUN: 20 mg/dL (ref 8–23)
CO2: 23 mmol/L (ref 22–32)
Calcium: 9.3 mg/dL (ref 8.9–10.3)
Chloride: 103 mmol/L (ref 98–111)
Creatinine, Ser: 1.06 mg/dL — ABNORMAL HIGH (ref 0.44–1.00)
GFR calc Af Amer: >60 mL/min (ref >60)
GFR calc non Af Amer: 53 mL/min — ABNORMAL LOW (ref >60)
Glucose, Bld: 154 mg/dL — ABNORMAL HIGH (ref 70–99)
Potassium: 3.5 mmol/L (ref 3.5–5.1)
Sodium: 137 mmol/L (ref 135–145)

## 2020-01-24 LAB — CBC
HCT: 37 % (ref 36.0–46.0)
Hemoglobin: 12.1 g/dL (ref 12.0–15.0)
MCH: 29.4 pg (ref 26.0–34.0)
MCHC: 32.7 g/dL (ref 30.0–36.0)
MCV: 89.8 fL (ref 80.0–100.0)
Platelets: 294 10*3/uL (ref 150–400)
RBC: 4.12 MIL/uL (ref 3.87–5.11)
RDW: 12.4 % (ref 11.5–15.5)
WBC: 9.2 10*3/uL (ref 4.0–10.5)
nRBC: 0 % (ref 0.0–0.2)

## 2020-01-24 LAB — GLUCOSE, CAPILLARY
Glucose-Capillary: 163 mg/dL — ABNORMAL HIGH (ref 70–99)
Glucose-Capillary: 144 mg/dL — ABNORMAL HIGH (ref 70–99)
Glucose-Capillary: 151 mg/dL — ABNORMAL HIGH (ref 70–99)

## 2020-01-24 LAB — ECHOCARDIOGRAM COMPLETE
Height: 63 in
Weight: 2070.4 oz

## 2020-01-24 LAB — LIPID PANEL
Cholesterol: 156 mg/dL (ref 0–200)
HDL: 63 mg/dL (ref >40)
LDL Cholesterol: 61 mg/dL (ref 0–99)
Total CHOL/HDL Ratio: 2.5 RATIO
Triglycerides: 160 mg/dL — ABNORMAL HIGH (ref <150)
VLDL: 32 mg/dL (ref 0–40)

## 2020-01-24 LAB — TSH: TSH: 0.866 u[IU]/mL (ref 0.350–4.500)

## 2020-01-24 MED ORDER — AMLODIPINE BESYLATE 10 MG PO TABS: 10.0000 mg | ORAL_TABLET | Freq: Every day | ORAL | 0 refills | Status: DC

## 2020-01-24 MED ORDER — POTASSIUM CHLORIDE CRYS ER 20 MEQ PO TBCR
20.0000 meq | EXTENDED_RELEASE_TABLET | Freq: Two times a day (BID) | ORAL | Status: DC
  Administered 2020-01-24: 20 meq via ORAL
  Filled 2020-01-24: qty 1

## 2020-01-24 MED ORDER — HYDRALAZINE HCL 50 MG PO TABS: 50.0000 mg | ORAL_TABLET | Freq: Three times a day (TID) | ORAL | 0 refills | Status: DC

## 2020-01-24 MED ORDER — LISINOPRIL 20 MG PO TABS
20.0000 mg | ORAL_TABLET | Freq: Every day | ORAL | Status: DC
  Administered 2020-01-24: 20 mg via ORAL
  Filled 2020-01-24: qty 1

## 2020-01-24 MED ORDER — METOPROLOL TARTRATE 100 MG PO TABS: 100.0000 mg | ORAL_TABLET | Freq: Two times a day (BID) | ORAL | 0 refills | Status: DC

## 2020-01-24 NOTE — Progress Notes (Addendum)
I have seen and examined the patient. I agree with the above note with the addition of : No further chest pain or shortness of breath.  Blood pressure seems to be more controlled.  By physical exam, heart is regular with no murmurs.  Lungs are clear to auscultation with no significant leg edema.  I personally reviewed her EKG which showed sinus rhythm with no significant ST changes. Echocardiogram showed normal LV systolic function.  Recommendations: Atypical chest pain and mildly elevated troponin likely supply demand ischemia in the setting of severely elevated blood pressure.  The patient had previous cardiac CTA in 2019 which showed minimal nonobstructive coronary artery disease.  Echocardiogram during this admission showed normal LV systolic function and wall motion.  No plans for further ischemic cardiac evaluation.  Blood pressure continues to be mildly elevated but more controlled.  Continue adjusting antihypertensive medications and consider outpatient renal artery duplex to ensure no underlying renal artery stenosis causing uncontrolled hypertension.    Lorine Bears MD, [facility] 01/24/2020 6:12 PM        Progress Note  Patient Name: Nicole Pittman Date of Encounter: 01/24/2020  Primary Cardiologist: Chrystie Nose, MD   Subjective   She denies any chest pain.  She reports dyspnea on exertion with ambulation of the bathroom, as well as racing heart rate.  She occasionally feels palpitations, which is not new for her.  She reports improvement in her chest pounding, except when she ambulates to the bathroom.  She is noted improvement in her lower extremity edema as well.  She reports that she lost 6 pounds overnight and had adequate urine output.   Inpatient Medications    Scheduled Meds: . amitriptyline  25 mg Oral QHS  . amLODipine  10 mg Oral Daily  . aspirin  81 mg Oral Q supper  . atorvastatin  40 mg Oral QHS  . baclofen  10 mg Oral BID  . cholecalciferol   1,000 Units Oral Daily  . dextromethorphan-guaiFENesin  1 tablet Oral BID  . enoxaparin (LOVENOX) injection  40 mg Subcutaneous Q24H  . fenofibrate  54 mg Oral Daily  . hydrALAZINE  50 mg Oral Q8H  . insulin aspart  0-5 Units Subcutaneous QHS  . insulin aspart  0-9 Units Subcutaneous TID WC  . levETIRAcetam  500 mg Oral Daily  . lisinopril  20 mg Oral Daily  . metoprolol tartrate  100 mg Oral BID  . oxyCODONE  20 mg Oral Q supper   Continuous Infusions:  PRN Meds: acetaminophen, albuterol, ALPRAZolam, hydrALAZINE, nitroGLYCERIN, ondansetron (ZOFRAN) IV, oxyCODONE-acetaminophen, zolpidem   Vital Signs    Vitals:   01/23/20 1617 01/23/20 1931 01/24/20 0451 01/24/20 0816  BP: (!) 155/69 (!) 154/62 (!) 154/67 (!) 158/60  Pulse: 83 (!) 104 81 78  Resp: 18 20 20 17   Temp: 98.2 F (36.8 C) 98.4 F (36.9 C) 98.4 F (36.9 C) 98.5 F (36.9 C)  TempSrc:  Oral Oral Oral  SpO2: 96% 96% 98% 97%  Weight:   58.7 kg   Height:        Intake/Output Summary (Last 24 hours) at 01/24/2020 1348 Last data filed at 01/24/2020 1003 Gross per 24 hour  Intake 1566.48 ml  Output 900 ml  Net 666.48 ml   Last 3 Weights 01/24/2020 01/23/2020 10/14/2019  Weight (lbs) 129 lb 6.4 oz 129 lb 131 lb  Weight (kg) 58.695 kg 58.514 kg 59.421 kg      Telemetry    Sinus tachycardia  and SR with rates 60s to 116 bpm- Personally Reviewed  ECG    No new tracingts - Personally Reviewed  Physical Exam   GEN: No acute distress.  Lying in bed watching TV. Neck: No JVD Cardiac: RRR, 1/6 systolic murmur.  No rubs, or gallops.  Respiratory:  Right lower lobe with trace crackles otherwise clear to auscultation bilaterally GI: Soft, nontender, non-distended  MS: No edema; No deformity. Neuro:  Nonfocal  Psych: Normal affect   Labs    High Sensitivity Troponin:   Recent Labs  Lab 01/23/20 0319 01/23/20 0516 01/23/20 0848 01/23/20 1033 01/23/20 1255  TROPONINIHS 9 48* 201* 253* 301*       Chemistry Recent Labs  Lab 01/23/20 0319 01/24/20 0618  NA 133* 137  K 4.2 3.5  CL 100 103  CO2 22 23  GLUCOSE 130* 154*  BUN 31* 20  CREATININE 1.38* 1.06*  CALCIUM 9.3 9.3  GFRNONAA 39* 53*  GFRAA 45* >60  ANIONGAP 11 11     Hematology Recent Labs  Lab 01/23/20 0319 01/24/20 0618  WBC 8.6 9.2  RBC 3.64* 4.12  HGB 10.7* 12.1  HCT 33.3* 37.0  MCV 91.5 89.8  MCH 29.4 29.4  MCHC 32.1 32.7  RDW 12.1 12.4  PLT 239 294    BNP Recent Labs  Lab 01/23/20 0319  BNP 38.0     DDimer No results for input(s): DDIMER in the last 168 hours.   Radiology    DG Chest 2 View  Result Date: 01/23/2020 CLINICAL DATA:  Hypertension, chest pain EXAM: CHEST - 2 VIEW COMPARISON:  07/08/2018 FINDINGS: Frontal and lateral views of the chest demonstrate an unremarkable cardiac silhouette. No airspace disease, effusion, or pneumothorax. No acute bony abnormality. IMPRESSION: 1. No acute intrathoracic process. Electronically Signed   By: Randa Ngo M.D.   On: 01/23/2020 03:40    Cardiac Studies   Pending updated echo  Echo 07/09/2018 - Left ventricle: The cavity size was normal. There was mild  concentric hypertrophy. Systolic function was normal. The  estimated ejection fraction was in the range of 55% to 60%. Wall  motion was normal; there were no regional wall motion  abnormalities. Doppler parameters are consistent with abnormal  left ventricular relaxation (grade 1 diastolic dysfunction).  Doppler parameters are consistent with elevated ventricular  end-diastolic filling pressure.  - Aortic valve: Trileaflet; normal thickness leaflets. There was no  regurgitation.  - Mitral valve: There was trivial regurgitation.  - Left atrium: The atrium was normal in size.  - Right ventricle: Systolic function was normal.  - Right atrium: The atrium was normal in size.  - Tricuspid valve: There was mild regurgitation.  - Pulmonary arteries: Systolic pressure was  at the upper limits of  normal.  - Inferior vena cava: The vessel was normal in size.  - Pericardium, extracardiac: There was no pericardial effusion.   3 to 7-day Holter monitor 08/11/2018 Predominantly NSR with infrequent PVCs and occasional bigeminy and trigeminy.  Patient Profile     69 y.o. female with a history of COPD, DM 2, hypertension, hyperlipidemia, gastric motility disorder, previous esophageal dilation, and who is being evaluated for chest discomfort and elevated blood pressure/poorly controlled hypertension.  Assessment & Plan    Atypical chest pain, suspected supply demand ischemia --No current chest pain.  Previously reported chest pain noted to be atypical and not likely 2/2 anxiety and a panic attack.  She reportedly had a similar episode in 2019 with work-up at that  time including echo and cardiac CTA with CAC score 12 and minimal coronary artery calcium.  Symptoms reportedly last 10 minutes and at present at rest, resolving on their own.  High-sensitivity troponin peaked at 200  300 and recommend continue to cycle at this time until peaked and down trending.  EKG without acute ST/T changes. Not consistent with ACS and suspect HS Tn elevation 2/2 supply demand ischemia in the setting of elevated pressures at this time. Updated echo pending.  No further ischemic work-up at this time unless echo shows reduced EF, wall motion abnormality, or acute structural changes. Continue medical management.  Labile hypertension --Blood pressure improved from admission but still poorly controlled at 158/60. Currently on amlodipine 10 mg, metoprolol 100 mg twice daily, hydralazine, and lisinopril 20 mg.  IVF could be contributing to elevated pressures. Could also consider renal artery stenosis work-up, given she is on multiple medications with BP still elevated.  Diastolic dysfunction -Reports DOE but breathing status improved.  Previous echo above with nl EF and borderline PASP and pending  updated echo. S/p IVF with subsequent volume overload and reportedly diuresed overnight per patient. Relatively euvolemic on exam today with trace right sided crackles. Pending updated echo. BP control recommended. Consider RAS workup as above.  Continue to monitor renal function electrolytes with current creatinine 1.06 and BUN 20.  Daily weights with most recent weight 58.5 kg  58.7 kg and stable.  Continue to monitor I's/O's with net +666.5 cc for the admission. Continue current medications.   Sinus tachycardia -Reports racing heart rate with exertion, as well as palpitations which are chronic finding for her.  Previous 2019 cardiac monitoring as above.  Could consider outpatient ZIO monitor with continued elevated rates and palpitations to rule out arrhythmia not captured on EKG or telemetry. Also continue to monitor electrolytes (as below). Will check TSH.  Hypokalemia  --Hypokalemic with potassium 3.5.  Ordered potassium supplementation with goal K 4.0.  Check magnesium with goal 2.0 and replete if needed.  Daily BMET/9 to monitor renal function electrolytes.  CKD --Creatinine reportedly elevated secondary to HCTZ.  She is s/p IV fluids for this bump in renal function. Daily BMET.  For questions or updates, please contact [facility] Please consult [email or web address] for contact info under        Signed, Lennon Alstrom, PA-C  01/24/2020, 1:48 PM

## 2020-01-24 NOTE — Progress Notes (Signed)
*  PRELIMINARY RESULTS* Echocardiogram 2D Echocardiogram has been performed.  Nicole Pittman 01/24/2020, 11:33 AM

## 2020-01-24 NOTE — Discharge Instructions (Signed)

## 2020-01-24 NOTE — Discharge Summary (Signed)
Physician Discharge Summary  [name] OAC:[id number] DOB: November 16, 1951 DOA: 01/23/2020  PCP: Jackie Plum, MD  Admit date: 01/23/2020 Discharge date: 01/24/2020  Admitted From: Home Disposition: Home  Recommendations for Outpatient Follow-up:  1. Follow up with PCP in 1-2 weeks 2. Follow-up with cardiology as directed 3. Inquire PCP work-up for Secondary causes of hypertension  Home Health: No Equipment/Devices: None Discharge Condition: Stable CODE STATUS: Full Diet recommendation: Heart Healthy  Brief/Interim Summary: HPI: Nicole Pittman is a 69 y.o. female with medical history significant of hypertension, hyperlipidemia, diabetes mellitus, COPD, depression, back pain, PAF not on anticoagulants, colitis, RLS, who presents with chest pain and elevated blood pressure.  Patient states that her chest pain started at about 1:30 AM today.  It is located substernal area and left side of chest, pressure-like, moderate, nonradiating. Her chest pain lasted for about 10 minutes and subsided spontaneously. Her blood pressure is also elevated at 180s. Her Bp is192/61 at arrival to the ED.  Patient denies cough, shortness of breath, fever or chills.  She has nausea, no vomiting, diarrhea or abdominal pain.  No symptoms of UTI or unilateral weakness.  No facial droop or slurred speech.  3/1: Patient seen and examined.  Troponin mildly trending.  Chest pain resolved.  Echocardiogram reviewed by cardiology.  Appears normal.  Recommendation for outpatient titration of blood pressure medications and investigation of possibly secondary causes of hypertension for example renovascular hypertension.  At this time patient stable for discharge from the hospital.  Changes to home antihypertensive regimen as below Discharge Diagnoses:  Principal Problem:   Hypertensive urgency Active Problems:   HTN (hypertension)   HLD (hyperlipidemia)   COPD (chronic obstructive pulmonary disease) (HCC)    Diabetes mellitus without complication (HCC)   AKI (acute kidney injury) (HCC)   Chest pain   RLS (restless legs syndrome)   Elevated troponin   Demand ischemia (HCC)  Hypertensive urgency Troponin elevation secondary to demand ischemia in setting of hypertensive urgency blood pressure 192/61 --> 181/79 --> 167/67.  Pt was started home meds in ED, including amlodipine, metoprolol, quinapril, Maxzide.  Due to AKI, will discontinue quinapril and Maxide.   Metoprolol 100 mg twice daily Add amlodipine 10 mg daily Add hydralazine 50 mg 3 times daily Continue quinapril Continue Maxide Follow-up with outpatient PCP No further inpatient ischemic work-up recommended  HTN (hypertension): -see above  HLD (hyperlipidemia): -lipitor  COPD (chronic obstructive pulmonary disease) (HCC): stable -Bronchodilators  Paroxysmal atrial fibrillation (HCC): No on anticoagulants at home.  Heart rate well controlled, 81 currently -Continue metoprolol  Diabetes mellitus without complication (HCC): Most recent A1c 7.0. Patient is taking Metaglip at home   Discharge Instructions  Discharge Instructions    Diet - low sodium heart healthy   Complete by: As directed    Discharge instructions   Complete by: As directed    Resume home medication regimen.  Please see your primary care doctor in 1 week   Increase activity slowly   Complete by: As directed      Allergies as of 01/24/2020      Reactions   Fentanyl Itching, Rash   PATCHES   Codeine Hives   Morphine And Related Hives   Symbicort [budesonide-formoterol Fumarate] Other (See Comments)   Made patient's chest "hurt" and affected vision (blurred)   Zanaflex [tizanidine] Palpitations, Other (See Comments)   And worsened insomina      Medication List    TAKE these medications   amitriptyline 25 MG tablet  Commonly known as: ELAVIL Take 25 mg by mouth at bedtime.   amLODipine 5 MG tablet Commonly known as: NORVASC Take 5 mg by  mouth daily.   aspirin 81 MG tablet Take 81 mg by mouth daily with supper.   atorvastatin 40 MG tablet Commonly known as: LIPITOR Take 40 mg by mouth at bedtime.   baclofen 10 MG tablet Commonly known as: LIORESAL Take 10 mg by mouth 2 (two) times daily.   Easy Comfort Lancets Misc 4 (four) times daily. for testing   fenofibrate 48 MG tablet Commonly known as: TRICOR Take 48 mg by mouth at bedtime.   FREESTYLE LITE test strip Generic drug: glucose blood 4 (four) times daily. for testing   glipiZIDE-metformin 5-500 MG tablet Commonly known as: METAGLIP Take 1 tablet by mouth 2 (two) times daily.   levETIRAcetam 500 MG tablet Commonly known as: KEPPRA Take 500 mg by mouth daily.   loperamide 2 MG capsule Commonly known as: IMODIUM Take 2 capsules (4 mg total) by mouth every 6 (six) hours. For diarrhea   metoCLOPramide 10 MG tablet Commonly known as: REGLAN Take 10 mg by mouth 4 (four) times daily as needed for nausea or vomiting.   metoprolol tartrate 100 MG tablet Commonly known as: LOPRESSOR Take 100 mg by mouth 2 (two) times daily. What changed: Another medication with the same name was changed. Make sure you understand how and when to take each.   metoprolol tartrate 100 MG tablet Commonly known as: LOPRESSOR Take 1 tablet (100 mg total) by mouth 2 (two) times daily. What changed:   medication strength  how much to take   nitroGLYCERIN 0.4 MG SL tablet Commonly known as: NITROSTAT Place 1 tablet (0.4 mg total) under the tongue every 5 (five) minutes as needed for chest pain. MAX 3 doses in 15 minutes   ondansetron 4 MG tablet Commonly known as: ZOFRAN Take 1 tablet (4 mg total) by mouth every 6 (six) hours as needed for nausea.   OxyCONTIN 20 mg 12 hr tablet Generic drug: oxyCODONE Take 20 mg by mouth daily with supper.   oxyCODONE 5 MG immediate release tablet Commonly known as: Oxy IR/ROXICODONE Take 5 mg by mouth 2 (two) times daily.   ProAir  HFA 108 (90 Base) MCG/ACT inhaler Generic drug: albuterol Inhale 2 puffs into the lungs 2 (two) times daily as needed for wheezing or shortness of breath.   quinapril 20 MG tablet Commonly known as: ACCUPRIL Take 20 mg by mouth daily.   triamterene-hydrochlorothiazide 37.5-25 MG tablet Commonly known as: MAXZIDE-25 Take 1 tablet by mouth daily.   VITAMIN D-3 PO Take 1 capsule by mouth daily.   zolpidem 10 MG tablet Commonly known as: AMBIEN Take 10 mg by mouth daily.      Follow-up Information    Osei-Bonsu, Iona Beard, MD. Schedule an appointment as soon as possible for a visit in 1 week(s).   Specialty: Internal Medicine Contact information: [address] [place] [address] [phone]        Pixie Casino, MD .   Specialty: Cardiology Contact information: [address] [place] [address] [phone]          Allergies  Allergen Reactions  . Fentanyl Itching and Rash    PATCHES  . Codeine Hives  . Morphine And Related Hives  . Symbicort [Budesonide-Formoterol Fumarate] Other (See Comments)    Made patient's chest "hurt" and affected vision (blurred)  . Zanaflex [Tizanidine] Palpitations  and Other (See Comments)    And worsened insomina    Consultations:  Cardiology-[facility]   Procedures/Studies: DG Chest 2 View  Result Date: 01/23/2020 CLINICAL DATA:  Hypertension, chest pain EXAM: CHEST - 2 VIEW COMPARISON:  07/08/2018 FINDINGS: Frontal and lateral views of the chest demonstrate an unremarkable cardiac silhouette. No airspace disease, effusion, or pneumothorax. No acute bony abnormality. IMPRESSION: 1. No acute intrathoracic process. Electronically Signed   By: Sharlet Salina M.D.   On: 01/23/2020 03:40   ECHOCARDIOGRAM COMPLETE  Result Date: 01/24/2020    ECHOCARDIOGRAM REPORT   Patient Name:   Nicole Pittman Date of Exam: 01/24/2020 Medical Rec #:  [id number]        Height:       63.0 in Accession #:    [id number]        Weight:       129.4 lb Date of Birth:  February 11, 1951        BSA:          1.607 m Patient Age:    69 years         BP:           158/60 mmHg Patient Gender: F                HR:           66 bpm. Exam Location:  [facility] Procedure: 2D Echo, Color Doppler and Cardiac Doppler Indications:     R07.9 Chest Pain  History:         Patient has prior history of Echocardiogram examinations. COPD;                  Risk Factors:Hypertension, Diabetes and HCL.  Sonographer:     Humphrey Rolls RDCS (AE) Referring Phys:  [id number] [address] Iba Diagnosing Phys: Debbe Odea MD  Sonographer Comments: Suboptimal subcostal window. IMPRESSIONS  1. Left ventricular ejection fraction, by estimation, is 60 to 65%. The left ventricle has normal function. The left ventricle has no regional wall motion abnormalities. Left ventricular diastolic parameters were normal.  2. Right ventricular systolic function is normal. The right ventricular size is normal.  3. The mitral valve is normal in structure and function. No evidence of mitral valve regurgitation.  4. The aortic valve is grossly normal. Aortic valve regurgitation is not visualized. FINDINGS  Left Ventricle: Left ventricular ejection fraction, by estimation, is 60 to 65%. The left ventricle has normal function. The left ventricle has no regional wall motion abnormalities. The left ventricular internal cavity size was normal in size. There is  no left ventricular hypertrophy. Left ventricular diastolic parameters were normal. Right Ventricle: The right ventricular size is normal. No increase in right ventricular wall thickness. Right ventricular systolic function is normal. Left Atrium: Left atrial size was normal in size. Right Atrium: Right atrial size was normal in size. Pericardium: There is no evidence of pericardial effusion. Mitral Valve: The mitral valve is normal in structure and function. No evidence of mitral valve regurgitation. MV peak gradient, 3.5 mmHg. The mean mitral valve  gradient is 1.0 mmHg. Tricuspid Valve: The tricuspid valve is normal in structure. Tricuspid valve regurgitation is not demonstrated. Aortic Valve: The aortic valve is grossly normal. Aortic valve regurgitation is not visualized. Aortic valve mean gradient measures 3.0 mmHg. Aortic valve peak gradient measures 4.9 mmHg. Aortic valve area, by VTI measures 2.28 cm. Pulmonic Valve: The pulmonic valve was normal in structure. Pulmonic valve regurgitation is trivial. Aorta: The  aortic root is normal in size and structure. Venous: The inferior vena cava was not well visualized. IAS/Shunts: No atrial level shunt detected by color flow Doppler.  LEFT VENTRICLE PLAX 2D LVIDd:         4.54 cm  Diastology LVIDs:         3.31 cm  LV e' lateral:   10.10 cm/s LV PW:         1.02 cm  LV E/e' lateral: 8.1 LV IVS:        0.76 cm  LV e' medial:    6.31 cm/s LVOT diam:     2.00 cm  LV E/e' medial:  13.0 LV SV:         56 LV SV Index:   35 LVOT Area:     3.14 cm  LEFT ATRIUM             Index LA diam:        2.90 cm 1.80 cm/m LA Vol (A2C):   32.1 ml 19.98 ml/m LA Vol (A4C):   43.5 ml 27.07 ml/m LA Biplane Vol: 38.1 ml 23.71 ml/m  AORTIC VALVE                   PULMONIC VALVE AV Area (Vmax):    2.22 cm    PV Vmax:       0.98 m/s AV Area (Vmean):   1.95 cm    PV Vmean:      67.600 cm/s AV Area (VTI):     2.28 cm    PV VTI:        0.226 m AV Vmax:           111.00 cm/s PV Peak grad:  3.8 mmHg AV Vmean:          83.500 cm/s PV Mean grad:  2.0 mmHg AV VTI:            0.247 m AV Peak Grad:      4.9 mmHg AV Mean Grad:      3.0 mmHg LVOT Vmax:         78.30 cm/s LVOT Vmean:        51.900 cm/s LVOT VTI:          0.179 m LVOT/AV VTI ratio: 0.72  AORTA Ao Root diam: 2.50 cm MITRAL VALVE MV Area (PHT): 3.42 cm    SHUNTS MV Peak grad:  3.5 mmHg    Systemic VTI:  0.18 m MV Mean grad:  1.0 mmHg    Systemic Diam: 2.00 cm MV Vmax:       0.93 m/s MV Vmean:      56.9 cm/s MV Decel Time: 222 msec MV E velocity: 82.00 cm/s MV A velocity: 75.70  cm/s MV E/A ratio:  1.08 Debbe Odea MD Electronically signed by Debbe Odea MD Signature Date/Time: 01/24/2020/5:15:47 PM    Final     (Echo, Carotid, EGD, Colonoscopy, ERCP)    Subjective: Seen and examined on the day of discharge No complaints, chest pain resolved Blood pressure improved but remains elevated  Discharge Exam: Vitals:   01/24/20 1508 01/24/20 1531  BP: 118/63 (!) 148/58  Pulse: 68 75  Resp:  18  Temp:  98.7 F (37.1 C)  SpO2:  98%   Vitals:   01/24/20 0451 01/24/20 0816 01/24/20 1508 01/24/20 1531  BP: (!) 154/67 (!) 158/60 118/63 (!) 148/58  Pulse: 81 78 68 75  Resp: 20 17  18   Temp: 98.4 F (  36.9 C) 98.5 F (36.9 C)  98.7 F (37.1 C)  TempSrc: Oral Oral  Oral  SpO2: 98% 97%  98%  Weight: 58.7 kg     Height:        General: Pt is alert, awake, not in acute distress Cardiovascular: RRR, S1/S2 +, no rubs, no gallops Respiratory: CTA bilaterally, no wheezing, no rhonchi Abdominal: Soft, NT, ND, bowel sounds + Extremities: no edema, no cyanosis    The results of significant diagnostics from this hospitalization (including imaging, microbiology, ancillary and laboratory) are listed below for reference.     Microbiology: Recent Results (from the past 240 hour(s))  SARS CORONAVIRUS 2 (TAT 6-24 HRS) Nasopharyngeal Nasopharyngeal Swab     Status: None   Collection Time: 01/23/20  6:34 AM   Specimen: Nasopharyngeal Swab  Result Value Ref Range Status   SARS Coronavirus 2 NEGATIVE NEGATIVE Final    Comment: (NOTE) SARS-CoV-2 target nucleic acids are NOT DETECTED. The SARS-CoV-2 RNA is generally detectable in upper and lower respiratory specimens during the acute phase of infection. Negative results do not preclude SARS-CoV-2 infection, do not rule out co-infections with other pathogens, and should not be used as the sole basis for treatment or other patient management decisions. Negative results must be combined with clinical  observations, patient history, and epidemiological information. The expected result is Negative. Fact Sheet for Patients: HairSlick.no Fact Sheet for Healthcare Providers: quierodirigir.com This test is not yet approved or cleared by the [place] FDA and  has been authorized for detection and/or diagnosis of SARS-CoV-2 by FDA under an Emergency Use Authorization (EUA). This EUA will remain  in effect (meaning this test can be used) for the duration of the COVID-19 declaration under Section 56 4(b)(1) of the Act, 21 U.S.C. section 360bbb-3(b)(1), unless the authorization is terminated or revoked sooner. Performed at [facility], [address], [place], [place] [address]      Labs: BNP (last 3 results) Recent Labs    01/23/20 0319  BNP 38.0   Basic Metabolic Panel: Recent Labs  Lab 01/23/20 0319 01/24/20 0618  NA 133* 137  K 4.2 3.5  CL 100 103  CO2 22 23  GLUCOSE 130* 154*  BUN 31* 20  CREATININE 1.38* 1.06*  CALCIUM 9.3 9.3   Liver Function Tests: No results for input(s): AST, ALT, ALKPHOS, BILITOT, PROT, ALBUMIN in the last 168 hours. No results for input(s): LIPASE, AMYLASE in the last 168 hours. No results for input(s): AMMONIA in the last 168 hours. CBC: Recent Labs  Lab 01/23/20 0319 01/24/20 0618  WBC 8.6 9.2  HGB 10.7* 12.1  HCT 33.3* 37.0  MCV 91.5 89.8  PLT 239 294   Cardiac Enzymes: No results for input(s): CKTOTAL, CKMB, CKMBINDEX, TROPONINI in the last 168 hours. BNP: Invalid input(s): POCBNP CBG: Recent Labs  Lab 01/23/20 1616 01/23/20 2051 01/24/20 0817 01/24/20 1217 01/24/20 1709  GLUCAP 203* 205* 163* 144* 151*   D-Dimer No results for input(s): DDIMER in the last 72 hours. Hgb A1c Recent Labs    01/23/20 1033  HGBA1C 7.1*   Lipid Profile Recent Labs    01/24/20 0618  CHOL 156  HDL 63  LDLCALC 61  TRIG 160*  CHOLHDL 2.5   Thyroid function  studies Recent Labs    01/24/20 0616  TSH 0.866   Anemia work up No results for input(s): VITAMINB12, FOLATE, FERRITIN, TIBC, IRON, RETICCTPCT in the last 72 hours. Urinalysis    Component Value Date/Time  COLORURINE YELLOW (A) 08/04/2019 0815   APPEARANCEUR HAZY (A) 08/04/2019 0815   LABSPEC 1.017 08/04/2019 0815   PHURINE 6.0 08/04/2019 0815   GLUCOSEU NEGATIVE 08/04/2019 0815   HGBUR NEGATIVE 08/04/2019 0815   BILIRUBINUR NEGATIVE 08/04/2019 0815   KETONESUR 20 (A) 08/04/2019 0815   PROTEINUR NEGATIVE 08/04/2019 0815   NITRITE NEGATIVE 08/04/2019 0815   LEUKOCYTESUR SMALL (A) 08/04/2019 0815   Sepsis Labs Invalid input(s): PROCALCITONIN,  WBC,  LACTICIDVEN Microbiology Recent Results (from the past 240 hour(s))  SARS CORONAVIRUS 2 (TAT 6-24 HRS) Nasopharyngeal Nasopharyngeal Swab     Status: None   Collection Time: 01/23/20  6:34 AM   Specimen: Nasopharyngeal Swab  Result Value Ref Range Status   SARS Coronavirus 2 NEGATIVE NEGATIVE Final    Comment: (NOTE) SARS-CoV-2 target nucleic acids are NOT DETECTED. The SARS-CoV-2 RNA is generally detectable in upper and lower respiratory specimens during the acute phase of infection. Negative results do not preclude SARS-CoV-2 infection, do not rule out co-infections with other pathogens, and should not be used as the sole basis for treatment or other patient management decisions. Negative results must be combined with clinical observations, patient history, and epidemiological information. The expected result is Negative. Fact Sheet for Patients: HairSlick.nohttps://www.fda.gov/media/138098/download Fact Sheet for Healthcare Providers: quierodirigir.comhttps://www.fda.gov/media/138095/download This test is not yet approved or cleared by the [place] FDA and  has been authorized for detection and/or diagnosis of SARS-CoV-2 by FDA under an Emergency Use Authorization (EUA). This EUA will remain  in effect (meaning this test can be used) for the  duration of the COVID-19 declaration under Section 56 4(b)(1) of the Act, 21 U.S.C. section 360bbb-3(b)(1), unless the authorization is terminated or revoked sooner. Performed at [facility], [address] [place], [place] [address]      Time coordinating discharge: Over 30 minutes  SIGNED:   Tresa MooreSudheer B Juventino Pavone, MD  [facility] 01/24/2020, 6:25 PM Pager   If 7PM-7AM, please contact night-coverage [email or web address] Password [email or web address]

## 2020-01-31 DIAGNOSIS — I4891 Unspecified atrial fibrillation: Secondary | ICD-10-CM | POA: Diagnosis not present

## 2020-01-31 DIAGNOSIS — E782 Mixed hyperlipidemia: Secondary | ICD-10-CM | POA: Diagnosis not present

## 2020-01-31 DIAGNOSIS — G250 Essential tremor: Secondary | ICD-10-CM | POA: Diagnosis not present

## 2020-01-31 DIAGNOSIS — I10 Essential (primary) hypertension: Secondary | ICD-10-CM | POA: Diagnosis not present

## 2020-01-31 DIAGNOSIS — R569 Unspecified convulsions: Secondary | ICD-10-CM | POA: Diagnosis not present

## 2020-01-31 DIAGNOSIS — K3184 Gastroparesis: Secondary | ICD-10-CM | POA: Diagnosis not present

## 2020-01-31 DIAGNOSIS — E119 Type 2 diabetes mellitus without complications: Secondary | ICD-10-CM | POA: Diagnosis not present

## 2020-01-31 DIAGNOSIS — J449 Chronic obstructive pulmonary disease, unspecified: Secondary | ICD-10-CM | POA: Diagnosis not present

## 2020-01-31 DIAGNOSIS — Z87891 Personal history of nicotine dependence: Secondary | ICD-10-CM | POA: Diagnosis not present

## 2020-02-18 DIAGNOSIS — M545 Low back pain: Secondary | ICD-10-CM | POA: Diagnosis not present

## 2020-02-18 DIAGNOSIS — G894 Chronic pain syndrome: Secondary | ICD-10-CM | POA: Diagnosis not present

## 2020-02-18 DIAGNOSIS — Z5181 Encounter for therapeutic drug level monitoring: Secondary | ICD-10-CM | POA: Diagnosis not present

## 2020-02-18 DIAGNOSIS — Z79899 Other long term (current) drug therapy: Secondary | ICD-10-CM | POA: Diagnosis not present

## 2020-02-18 DIAGNOSIS — M961 Postlaminectomy syndrome, not elsewhere classified: Secondary | ICD-10-CM | POA: Diagnosis not present

## 2020-02-28 DIAGNOSIS — N289 Disorder of kidney and ureter, unspecified: Secondary | ICD-10-CM | POA: Diagnosis not present

## 2020-02-28 DIAGNOSIS — I4891 Unspecified atrial fibrillation: Secondary | ICD-10-CM | POA: Diagnosis not present

## 2020-02-28 DIAGNOSIS — Z87891 Personal history of nicotine dependence: Secondary | ICD-10-CM | POA: Diagnosis not present

## 2020-02-28 DIAGNOSIS — E119 Type 2 diabetes mellitus without complications: Secondary | ICD-10-CM | POA: Diagnosis not present

## 2020-02-28 DIAGNOSIS — E782 Mixed hyperlipidemia: Secondary | ICD-10-CM | POA: Diagnosis not present

## 2020-02-28 DIAGNOSIS — K3184 Gastroparesis: Secondary | ICD-10-CM | POA: Diagnosis not present

## 2020-02-28 DIAGNOSIS — J449 Chronic obstructive pulmonary disease, unspecified: Secondary | ICD-10-CM | POA: Diagnosis not present

## 2020-02-28 DIAGNOSIS — R569 Unspecified convulsions: Secondary | ICD-10-CM | POA: Diagnosis not present

## 2020-02-28 DIAGNOSIS — G250 Essential tremor: Secondary | ICD-10-CM | POA: Diagnosis not present

## 2020-02-28 DIAGNOSIS — I10 Essential (primary) hypertension: Secondary | ICD-10-CM | POA: Diagnosis not present

## 2020-03-08 DIAGNOSIS — I10 Essential (primary) hypertension: Secondary | ICD-10-CM | POA: Diagnosis not present

## 2020-03-08 DIAGNOSIS — E782 Mixed hyperlipidemia: Secondary | ICD-10-CM | POA: Diagnosis not present

## 2020-03-08 DIAGNOSIS — K3184 Gastroparesis: Secondary | ICD-10-CM | POA: Diagnosis not present

## 2020-03-08 DIAGNOSIS — R569 Unspecified convulsions: Secondary | ICD-10-CM | POA: Diagnosis not present

## 2020-03-08 DIAGNOSIS — E119 Type 2 diabetes mellitus without complications: Secondary | ICD-10-CM | POA: Diagnosis not present

## 2020-03-08 DIAGNOSIS — G250 Essential tremor: Secondary | ICD-10-CM | POA: Diagnosis not present

## 2020-03-08 DIAGNOSIS — I4891 Unspecified atrial fibrillation: Secondary | ICD-10-CM | POA: Diagnosis not present

## 2020-03-08 DIAGNOSIS — J449 Chronic obstructive pulmonary disease, unspecified: Secondary | ICD-10-CM | POA: Diagnosis not present

## 2020-03-08 DIAGNOSIS — Z87891 Personal history of nicotine dependence: Secondary | ICD-10-CM | POA: Diagnosis not present

## 2020-04-10 DIAGNOSIS — I10 Essential (primary) hypertension: Secondary | ICD-10-CM | POA: Diagnosis not present

## 2020-04-10 DIAGNOSIS — E119 Type 2 diabetes mellitus without complications: Secondary | ICD-10-CM | POA: Diagnosis not present

## 2020-04-10 DIAGNOSIS — Z87891 Personal history of nicotine dependence: Secondary | ICD-10-CM | POA: Diagnosis not present

## 2020-04-10 DIAGNOSIS — G250 Essential tremor: Secondary | ICD-10-CM | POA: Diagnosis not present

## 2020-04-10 DIAGNOSIS — E782 Mixed hyperlipidemia: Secondary | ICD-10-CM | POA: Diagnosis not present

## 2020-04-10 DIAGNOSIS — J449 Chronic obstructive pulmonary disease, unspecified: Secondary | ICD-10-CM | POA: Diagnosis not present

## 2020-04-10 DIAGNOSIS — R569 Unspecified convulsions: Secondary | ICD-10-CM | POA: Diagnosis not present

## 2020-04-10 DIAGNOSIS — K3184 Gastroparesis: Secondary | ICD-10-CM | POA: Diagnosis not present

## 2020-04-10 DIAGNOSIS — I4891 Unspecified atrial fibrillation: Secondary | ICD-10-CM | POA: Diagnosis not present

## 2020-05-09 DIAGNOSIS — F419 Anxiety disorder, unspecified: Secondary | ICD-10-CM | POA: Diagnosis not present

## 2020-05-09 DIAGNOSIS — Z79891 Long term (current) use of opiate analgesic: Secondary | ICD-10-CM | POA: Diagnosis not present

## 2020-05-09 DIAGNOSIS — M25559 Pain in unspecified hip: Secondary | ICD-10-CM | POA: Diagnosis not present

## 2020-05-09 DIAGNOSIS — G894 Chronic pain syndrome: Secondary | ICD-10-CM | POA: Diagnosis not present

## 2020-05-09 DIAGNOSIS — M961 Postlaminectomy syndrome, not elsewhere classified: Secondary | ICD-10-CM | POA: Diagnosis not present

## 2020-05-12 DIAGNOSIS — H2513 Age-related nuclear cataract, bilateral: Secondary | ICD-10-CM | POA: Diagnosis not present

## 2020-05-12 DIAGNOSIS — H40003 Preglaucoma, unspecified, bilateral: Secondary | ICD-10-CM | POA: Diagnosis not present

## 2020-05-31 DIAGNOSIS — G250 Essential tremor: Secondary | ICD-10-CM | POA: Diagnosis not present

## 2020-05-31 DIAGNOSIS — J449 Chronic obstructive pulmonary disease, unspecified: Secondary | ICD-10-CM | POA: Diagnosis not present

## 2020-05-31 DIAGNOSIS — I10 Essential (primary) hypertension: Secondary | ICD-10-CM | POA: Diagnosis not present

## 2020-05-31 DIAGNOSIS — I4891 Unspecified atrial fibrillation: Secondary | ICD-10-CM | POA: Diagnosis not present

## 2020-05-31 DIAGNOSIS — R569 Unspecified convulsions: Secondary | ICD-10-CM | POA: Diagnosis not present

## 2020-05-31 DIAGNOSIS — Z87891 Personal history of nicotine dependence: Secondary | ICD-10-CM | POA: Diagnosis not present

## 2020-05-31 DIAGNOSIS — K3184 Gastroparesis: Secondary | ICD-10-CM | POA: Diagnosis not present

## 2020-05-31 DIAGNOSIS — E119 Type 2 diabetes mellitus without complications: Secondary | ICD-10-CM | POA: Diagnosis not present

## 2020-05-31 DIAGNOSIS — E782 Mixed hyperlipidemia: Secondary | ICD-10-CM | POA: Diagnosis not present

## 2020-06-09 DIAGNOSIS — H2513 Age-related nuclear cataract, bilateral: Secondary | ICD-10-CM | POA: Diagnosis not present

## 2020-07-03 DIAGNOSIS — N200 Calculus of kidney: Secondary | ICD-10-CM | POA: Diagnosis not present

## 2020-07-03 DIAGNOSIS — E1122 Type 2 diabetes mellitus with diabetic chronic kidney disease: Secondary | ICD-10-CM | POA: Diagnosis not present

## 2020-07-03 DIAGNOSIS — R82998 Other abnormal findings in urine: Secondary | ICD-10-CM | POA: Diagnosis not present

## 2020-07-03 DIAGNOSIS — N1832 Chronic kidney disease, stage 3b: Secondary | ICD-10-CM | POA: Diagnosis not present

## 2020-07-03 DIAGNOSIS — I129 Hypertensive chronic kidney disease with stage 1 through stage 4 chronic kidney disease, or unspecified chronic kidney disease: Secondary | ICD-10-CM | POA: Diagnosis not present

## 2020-07-07 ENCOUNTER — Other Ambulatory Visit: Payer: Self-pay | Admitting: Nephrology

## 2020-07-07 DIAGNOSIS — N1832 Chronic kidney disease, stage 3b: Secondary | ICD-10-CM

## 2020-07-10 ENCOUNTER — Other Ambulatory Visit: Payer: PPO

## 2020-08-01 DIAGNOSIS — Z79899 Other long term (current) drug therapy: Secondary | ICD-10-CM | POA: Diagnosis not present

## 2020-08-01 DIAGNOSIS — Z79891 Long term (current) use of opiate analgesic: Secondary | ICD-10-CM | POA: Diagnosis not present

## 2020-08-01 DIAGNOSIS — M961 Postlaminectomy syndrome, not elsewhere classified: Secondary | ICD-10-CM | POA: Diagnosis not present

## 2020-08-01 DIAGNOSIS — G894 Chronic pain syndrome: Secondary | ICD-10-CM | POA: Diagnosis not present

## 2020-09-01 DIAGNOSIS — Z23 Encounter for immunization: Secondary | ICD-10-CM | POA: Diagnosis not present

## 2020-09-01 DIAGNOSIS — E119 Type 2 diabetes mellitus without complications: Secondary | ICD-10-CM | POA: Diagnosis not present

## 2020-09-01 DIAGNOSIS — I10 Essential (primary) hypertension: Secondary | ICD-10-CM | POA: Diagnosis not present

## 2020-09-01 DIAGNOSIS — J449 Chronic obstructive pulmonary disease, unspecified: Secondary | ICD-10-CM | POA: Diagnosis not present

## 2020-09-01 DIAGNOSIS — I4891 Unspecified atrial fibrillation: Secondary | ICD-10-CM | POA: Diagnosis not present

## 2020-09-01 DIAGNOSIS — Z87891 Personal history of nicotine dependence: Secondary | ICD-10-CM | POA: Diagnosis not present

## 2020-09-01 DIAGNOSIS — K3184 Gastroparesis: Secondary | ICD-10-CM | POA: Diagnosis not present

## 2020-09-01 DIAGNOSIS — G250 Essential tremor: Secondary | ICD-10-CM | POA: Diagnosis not present

## 2020-09-01 DIAGNOSIS — R569 Unspecified convulsions: Secondary | ICD-10-CM | POA: Diagnosis not present

## 2020-09-01 DIAGNOSIS — E782 Mixed hyperlipidemia: Secondary | ICD-10-CM | POA: Diagnosis not present

## 2020-10-17 ENCOUNTER — Encounter: Payer: Self-pay | Admitting: Physician Assistant

## 2020-10-17 ENCOUNTER — Telehealth (INDEPENDENT_AMBULATORY_CARE_PROVIDER_SITE_OTHER): Payer: PPO | Admitting: Physician Assistant

## 2020-10-17 ENCOUNTER — Telehealth: Payer: Self-pay

## 2020-10-17 VITALS — BP 136/80 | HR 61 | Ht 62.5 in | Wt 129.0 lb

## 2020-10-17 DIAGNOSIS — J449 Chronic obstructive pulmonary disease, unspecified: Secondary | ICD-10-CM

## 2020-10-17 DIAGNOSIS — I10 Essential (primary) hypertension: Secondary | ICD-10-CM

## 2020-10-17 DIAGNOSIS — E785 Hyperlipidemia, unspecified: Secondary | ICD-10-CM

## 2020-10-17 DIAGNOSIS — I2510 Atherosclerotic heart disease of native coronary artery without angina pectoris: Secondary | ICD-10-CM | POA: Diagnosis not present

## 2020-10-17 DIAGNOSIS — E119 Type 2 diabetes mellitus without complications: Secondary | ICD-10-CM

## 2020-10-17 MED ORDER — NITROGLYCERIN 0.4 MG SL SUBL: 0.4000 mg | SUBLINGUAL_TABLET | SUBLINGUAL | 3 refills | Status: AC | PRN

## 2020-10-17 MED ORDER — AMLODIPINE BESYLATE 10 MG PO TABS: 10.0000 mg | ORAL_TABLET | Freq: Every day | ORAL | 3 refills | Status: DC

## 2020-10-17 NOTE — Progress Notes (Addendum)
Virtual Visit via Telephone Note   This visit type was conducted due to national recommendations for restrictions regarding the COVID-19 Pandemic (e.g. social distancing) in an effort to limit this patient's exposure and mitigate transmission in our community.  Due to her co-morbid illnesses, this patient is at least at moderate risk for complications without adequate follow up.  This format is felt to be most appropriate for this patient at this time.  The patient did not have access to video technology/had technical difficulties with video requiring transitioning to audio format only (telephone).  All issues noted in this document were discussed and addressed.  No physical exam could be performed with this format.  Please refer to the patient's chart for her  consent to telehealth for [facility].    Date:  10/20/2020   ID:  Nicole Pittman, DOB Feb 11, 1951, MRN [id number] The patient was identified using 2 identifiers.  Patient Location: Home Provider Location: Home Office  PCP:  Jackie Plum, MD  Cardiologist:  Chrystie Nose, MD  Electrophysiologist:  None   Evaluation Performed:  Follow-Up Visit  Chief Complaint:  Follow up  History of Present Illness:    Nicole Pittman is a 69 y.o. female with past medical history of COPD, HTN, HLD and DM2.  Patient was previously sent to the hospital by her PCP for possible atrial fibrillation, however there was no documented EKG to support atrial fibrillation.  Her metoprolol was increased.  Echocardiogram showed normal EF and normal atrial size.  After discharge, she had episodes of irregular heartbeat.  Her monitor showed predominantly sinus rhythm with infrequent PVCs in bigeminy and trigeminy pattern.  There was no atrial fibrillation.  She underwent coronary CT on 09/14/2018 that showed calcium score of 12, minimal nonobstructive CAD.  She has a history of gastric motility issue and being followed by GI physician.  Patient was  admitted to the hospital in February 2021 due to chest pain, elevated blood pressure and mild AKI.  Troponin was mildly elevated.  Elevated blood pressure was felt to be related to panic attack/anxiety.  Echocardiogram obtained on 01/24/2020 showed EF 60 to 65%, no significant valve issue.  Patient presents today for virtual visit.  She continued to have dyspnea on heavy exertion due to COPD. Otherwise she denies any significant chest discomfort. She has no lower extremity edema, orthopnea or PND. She can follow-up in 1 year  The patient does not have symptoms concerning for COVID-19 infection (fever, chills, cough, or new shortness of breath).    Past Medical History:  Diagnosis Date  . Adhesive arachnoiditis   . Arthritis   . Back problem    spurs  . COPD (chronic obstructive pulmonary disease) (HCC)   . Diabetes (HCC)   . High cholesterol   . Hypertension   . Neck problem    bad disc   Past Surgical History:  Procedure Laterality Date  . BACK SURGERY  1978   L5  . CHOLECYSTECTOMY  1992  . KNEE SURGERY Bilateral 1993  . OVARIAN CYST REMOVAL  1985  . TONSILLECTOMY  1959  . WRIST SURGERY Bilateral 2003     Current Meds  Medication Sig  . amitriptyline (ELAVIL) 25 MG tablet Take 25 mg by mouth at bedtime.  Marland Kitchen amLODipine (NORVASC) 10 MG tablet Take 1 tablet (10 mg total) by mouth daily.  Marland Kitchen aspirin 81 MG tablet Take 81 mg by mouth daily with supper.   Marland Kitchen atorvastatin (LIPITOR) 40 MG tablet Take  40 mg by mouth at bedtime.   . baclofen (LIORESAL) 10 MG tablet Take 10 mg by mouth 2 (two) times daily.   . Cholecalciferol (VITAMIN D-3 PO) Take 1 capsule by mouth daily.  Marland Kitchen EASY COMFORT LANCETS MISC 4 (four) times daily. for testing  . fenofibrate (TRICOR) 48 MG tablet Take 48 mg by mouth at bedtime.   Marland Kitchen FREESTYLE LITE test strip 4 (four) times daily. for testing  . glipiZIDE-metformin (METAGLIP) 5-500 MG tablet Take 1 tablet by mouth 2 (two) times daily.   . hydrOXYzine  (ATARAX/VISTARIL) 25 MG tablet Take 25 mg by mouth daily.  Marland Kitchen levETIRAcetam (KEPPRA) 500 MG tablet Take 500 mg by mouth daily.  Marland Kitchen loperamide (IMODIUM) 2 MG capsule Take 2 capsules (4 mg total) by mouth every 6 (six) hours. For diarrhea  . metoCLOPramide (REGLAN) 10 MG tablet Take 10 mg by mouth 4 (four) times daily as needed for nausea or vomiting.   . metoprolol tartrate (LOPRESSOR) 100 MG tablet Take 100 mg by mouth 2 (two) times daily.  . ondansetron (ZOFRAN) 4 MG tablet Take 1 tablet (4 mg total) by mouth every 6 (six) hours as needed for nausea.  Marland Kitchen oxyCODONE (OXY IR/ROXICODONE) 5 MG immediate release tablet Take 5 mg by mouth 2 (two) times daily.   . OXYCONTIN 20 MG 12 hr tablet Take 10 mg by mouth in the morning and at bedtime.   . [DISCONTINUED] amLODipine (NORVASC) 10 MG tablet Take 1 tablet (10 mg total) by mouth daily.     Allergies:   Fentanyl, Codeine, Morphine and related, Symbicort [budesonide-formoterol fumarate], and Zanaflex [tizanidine]   Social History   Tobacco Use  . Smoking status: Former Smoker    Packs/day: 0.50    Types: Cigarettes  . Smokeless tobacco: Never Used  Vaping Use  . Vaping Use: Every day  Substance Use Topics  . Alcohol use: No    Alcohol/week: 0.0 standard drinks  . Drug use: No     Family Hx: The patient's family history includes Breast cancer in her maternal aunt; Diabetes in her father; Hypertension in her mother; Lupus in her maternal aunt; Parkinson's disease in her mother; Parkinsonism in her mother; Rheum arthritis in her mother.  ROS:   Please see the history of present illness.     All other systems reviewed and are negative.   Prior CV studies:   The following studies were reviewed today:  Echo 01/24/2020 1. Left ventricular ejection fraction, by estimation, is 60 to 65%. The  left ventricle has normal function. The left ventricle has no regional  wall motion abnormalities. Left ventricular diastolic parameters were  normal.   2. Right ventricular systolic function is normal. The right ventricular  size is normal.  3. The mitral valve is normal in structure and function. No evidence of  mitral valve regurgitation.  4. The aortic valve is grossly normal. Aortic valve regurgitation is not  visualized.   Labs/Other Tests and Data Reviewed:    EKG:  An ECG dated 01/24/2020 was personally reviewed today and demonstrated:  Normal sinus rhythm without significant ST-T wave changes.  Recent Labs: 01/23/2020: B Natriuretic Peptide 38.0 01/24/2020: BUN 20; Creatinine, Ser 1.06; Hemoglobin 12.1; Platelets 294; Potassium 3.5; Sodium 137; TSH 0.866   Recent Lipid Panel Lab Results  Component Value Date/Time   CHOL 156 01/24/2020 06:18 AM   TRIG 160 (H) 01/24/2020 06:18 AM   HDL 63 01/24/2020 06:18 AM   CHOLHDL 2.5 01/24/2020 06:18 AM  LDLCALC 61 01/24/2020 06:18 AM    Wt Readings from Last 3 Encounters:  10/17/20 129 lb (58.5 kg)  01/24/20 129 lb 6.4 oz (58.7 kg)  10/14/19 131 lb (59.4 kg)     Risk Assessment/Calculations:      Objective:    Vital Signs:  BP 136/80   Pulse 61   Ht 5' 2.5" (1.588 m)   Wt 129 lb (58.5 kg)   BMI 23.22 kg/m    VITAL SIGNS:  reviewed  ASSESSMENT & PLAN:    1. CAD: Coronary CT obtained on 09/14/2018 showed minimal CAD. Patient denies any recent chest pain.  2. Hypertension: Blood pressure stable on current therapy  3. Hyperlipidemia: On Lipitor 40 mg daily, LDL goal less than 70.  4. DM2: Managed by primary care provider.  5. COPD: No recent exacerbation.   Shared Decision Making/Informed Consent        COVID-19 Education: The signs and symptoms of COVID-19 were discussed with the patient and how to seek care for testing (follow up with PCP or arrange E-visit).  The importance of social distancing was discussed today.  Time:   Today, I have spent 5 minutes with the patient with telehealth technology discussing the above problems.     Medication  Adjustments/Labs and Tests Ordered: Current medicines are reviewed at length with the patient today.  Concerns regarding medicines are outlined above.   Tests Ordered: No orders of the defined types were placed in this encounter.   Medication Changes: Meds ordered this encounter  Medications  . amLODipine (NORVASC) 10 MG tablet    Sig: Take 1 tablet (10 mg total) by mouth daily.    Dispense:  90 tablet    Refill:  3  . nitroGLYCERIN (NITROSTAT) 0.4 MG SL tablet    Sig: Place 1 tablet (0.4 mg total) under the tongue every 5 (five) minutes as needed for chest pain. MAX 3 doses in 15 minutes    Dispense:  25 tablet    Refill:  3    Follow Up:  In Person in 1 year(s)  Signed, Azalee Course, [place]  10/20/2020 12:41 AM    [facility]

## 2020-10-17 NOTE — Patient Instructions (Addendum)
Medication Instructions:  Your physician recommends that you continue on your current medications as directed. Please refer to the Current Medication list given to you today.  *If you need a refill on your cardiac medications before your next appointment, please call your pharmacy*  Lab Work: NONE ordered at this time of appointment   If you have labs (blood work) drawn today and your tests are completely normal, you will receive your results only by: . MyChart Message (if you have MyChart) OR . A paper copy in the mail If you have any lab test that is abnormal or we need to change your treatment, we will call you to review the results.  Testing/Procedures: NONE ordered at this time of appointment   Follow-Up: At [facility], you and your health needs are our priority.  As part of our continuing mission to provide you with exceptional heart care, we have created designated Provider Care Teams.  These Care Teams include your primary Cardiologist (physician) and Advanced Practice Providers (APPs -  Physician Assistants and Nurse Practitioners) who all work together to provide you with the care you need, when you need it.  Your next appointment:   1 year(s)  The format for your next appointment:   In Person  Provider:   K. Chad Hilty, MD  Other Instructions   

## 2020-10-17 NOTE — Telephone Encounter (Signed)
  Patient Consent for Virtual Visit         Nicole Pittman has provided verbal consent on 10/17/2020 for a virtual visit (video or telephone).   CONSENT FOR VIRTUAL VISIT FOR:  Nicole Pittman  By participating in this virtual visit I agree to the following:  I hereby voluntarily request, consent and authorize [facility] and its employed or contracted physicians, physician assistants, nurse practitioners or other licensed health care professionals (the Practitioner), to provide me with telemedicine health care services (the "Services") as deemed necessary by the treating Practitioner. I acknowledge and consent to receive the Services by the Practitioner via telemedicine. I understand that the telemedicine visit will involve communicating with the Practitioner through live audiovisual communication technology and the disclosure of certain medical information by electronic transmission. I acknowledge that I have been given the opportunity to request an in-person assessment or other available alternative prior to the telemedicine visit and am voluntarily participating in the telemedicine visit.  I understand that I have the right to withhold or withdraw my consent to the use of telemedicine in the course of my care at any time, without affecting my right to future care or treatment, and that the Practitioner or I may terminate the telemedicine visit at any time. I understand that I have the right to inspect all information obtained and/or recorded in the course of the telemedicine visit and may receive copies of available information for a reasonable fee.  I understand that some of the potential risks of receiving the Services via telemedicine include:  Marland Kitchen Delay or interruption in medical evaluation due to technological equipment failure or disruption; . Information transmitted may not be sufficient (e.g. poor resolution of images) to allow for appropriate medical decision making by the  Practitioner; and/or  . In rare instances, security protocols could fail, causing a breach of personal health information.  Furthermore, I acknowledge that it is my responsibility to provide information about my medical history, conditions and care that is complete and accurate to the best of my ability. I acknowledge that Practitioner's advice, recommendations, and/or decision may be based on factors not within their control, such as incomplete or inaccurate data provided by me or distortions of diagnostic images or specimens that may result from electronic transmissions. I understand that the practice of medicine is not an exact science and that Practitioner makes no warranties or guarantees regarding treatment outcomes. I acknowledge that a copy of this consent can be made available to me via my patient portal [facility] MyChart), or I can request a printed copy by calling the office of [facility].    I understand that my insurance will be billed for this visit.   I have read or had this consent read to me. . I understand the contents of this consent, which adequately explains the benefits and risks of the Services being provided via telemedicine.  . I have been provided ample opportunity to ask questions regarding this consent and the Services and have had my questions answered to my satisfaction. . I give my informed consent for the services to be provided through the use of telemedicine in my medical care

## 2020-10-24 DIAGNOSIS — M545 Low back pain, unspecified: Secondary | ICD-10-CM | POA: Diagnosis not present

## 2020-10-24 DIAGNOSIS — M25559 Pain in unspecified hip: Secondary | ICD-10-CM | POA: Diagnosis not present

## 2020-10-24 DIAGNOSIS — F419 Anxiety disorder, unspecified: Secondary | ICD-10-CM | POA: Diagnosis not present

## 2020-10-24 DIAGNOSIS — M961 Postlaminectomy syndrome, not elsewhere classified: Secondary | ICD-10-CM | POA: Diagnosis not present

## 2020-10-24 DIAGNOSIS — G894 Chronic pain syndrome: Secondary | ICD-10-CM | POA: Diagnosis not present

## 2020-10-24 DIAGNOSIS — Z79891 Long term (current) use of opiate analgesic: Secondary | ICD-10-CM | POA: Diagnosis not present

## 2020-10-24 DIAGNOSIS — Z79899 Other long term (current) drug therapy: Secondary | ICD-10-CM | POA: Diagnosis not present

## 2020-10-26 ENCOUNTER — Ambulatory Visit
Admission: RE | Admit: 2020-10-26 | Discharge: 2020-10-26 | Disposition: A | Discharge status: Home / Self Care | Payer: PPO | Source: Ambulatory Visit | Attending: Physician Assistant | Admitting: Physician Assistant

## 2020-10-26 ENCOUNTER — Other Ambulatory Visit: Payer: Self-pay | Admitting: Physician Assistant

## 2020-10-26 DIAGNOSIS — M545 Low back pain, unspecified: Secondary | ICD-10-CM

## 2020-10-26 DIAGNOSIS — M5136 Other intervertebral disc degeneration, lumbar region: Secondary | ICD-10-CM | POA: Diagnosis not present

## 2020-10-26 DIAGNOSIS — N1832 Chronic kidney disease, stage 3b: Secondary | ICD-10-CM | POA: Diagnosis not present

## 2020-10-31 DIAGNOSIS — N1831 Chronic kidney disease, stage 3a: Secondary | ICD-10-CM | POA: Diagnosis not present

## 2020-10-31 DIAGNOSIS — E559 Vitamin D deficiency, unspecified: Secondary | ICD-10-CM | POA: Diagnosis not present

## 2020-10-31 DIAGNOSIS — N200 Calculus of kidney: Secondary | ICD-10-CM | POA: Diagnosis not present

## 2020-10-31 DIAGNOSIS — E1122 Type 2 diabetes mellitus with diabetic chronic kidney disease: Secondary | ICD-10-CM | POA: Diagnosis not present

## 2020-10-31 DIAGNOSIS — I129 Hypertensive chronic kidney disease with stage 1 through stage 4 chronic kidney disease, or unspecified chronic kidney disease: Secondary | ICD-10-CM | POA: Diagnosis not present

## 2020-11-06 ENCOUNTER — Ambulatory Visit
Admission: RE | Admit: 2020-11-06 | Discharge: 2020-11-06 | Disposition: A | Discharge status: Home / Self Care | Payer: PPO | Source: Ambulatory Visit | Attending: Nephrology | Admitting: Nephrology

## 2020-11-06 DIAGNOSIS — N1832 Chronic kidney disease, stage 3b: Secondary | ICD-10-CM

## 2020-11-06 DIAGNOSIS — N189 Chronic kidney disease, unspecified: Secondary | ICD-10-CM | POA: Diagnosis not present

## 2020-11-06 DIAGNOSIS — I129 Hypertensive chronic kidney disease with stage 1 through stage 4 chronic kidney disease, or unspecified chronic kidney disease: Secondary | ICD-10-CM | POA: Diagnosis not present

## 2020-11-14 DIAGNOSIS — R569 Unspecified convulsions: Secondary | ICD-10-CM | POA: Diagnosis not present

## 2020-11-14 DIAGNOSIS — K3184 Gastroparesis: Secondary | ICD-10-CM | POA: Diagnosis not present

## 2020-11-14 DIAGNOSIS — E119 Type 2 diabetes mellitus without complications: Secondary | ICD-10-CM | POA: Diagnosis not present

## 2020-11-14 DIAGNOSIS — J449 Chronic obstructive pulmonary disease, unspecified: Secondary | ICD-10-CM | POA: Diagnosis not present

## 2020-11-14 DIAGNOSIS — Z0001 Encounter for general adult medical examination with abnormal findings: Secondary | ICD-10-CM | POA: Diagnosis not present

## 2020-11-14 DIAGNOSIS — I4891 Unspecified atrial fibrillation: Secondary | ICD-10-CM | POA: Diagnosis not present

## 2020-11-14 DIAGNOSIS — G250 Essential tremor: Secondary | ICD-10-CM | POA: Diagnosis not present

## 2020-11-14 DIAGNOSIS — E782 Mixed hyperlipidemia: Secondary | ICD-10-CM | POA: Diagnosis not present

## 2020-11-14 DIAGNOSIS — Z87891 Personal history of nicotine dependence: Secondary | ICD-10-CM | POA: Diagnosis not present

## 2020-11-14 DIAGNOSIS — I10 Essential (primary) hypertension: Secondary | ICD-10-CM | POA: Diagnosis not present

## 2020-12-28 DIAGNOSIS — I129 Hypertensive chronic kidney disease with stage 1 through stage 4 chronic kidney disease, or unspecified chronic kidney disease: Secondary | ICD-10-CM | POA: Diagnosis not present

## 2021-01-23 DIAGNOSIS — M25559 Pain in unspecified hip: Secondary | ICD-10-CM | POA: Diagnosis not present

## 2021-01-23 DIAGNOSIS — M961 Postlaminectomy syndrome, not elsewhere classified: Secondary | ICD-10-CM | POA: Diagnosis not present

## 2021-01-23 DIAGNOSIS — Z79899 Other long term (current) drug therapy: Secondary | ICD-10-CM | POA: Diagnosis not present

## 2021-01-23 DIAGNOSIS — F419 Anxiety disorder, unspecified: Secondary | ICD-10-CM | POA: Diagnosis not present

## 2021-01-23 DIAGNOSIS — M545 Low back pain, unspecified: Secondary | ICD-10-CM | POA: Diagnosis not present

## 2021-01-23 DIAGNOSIS — Z79891 Long term (current) use of opiate analgesic: Secondary | ICD-10-CM | POA: Diagnosis not present

## 2021-01-23 DIAGNOSIS — G894 Chronic pain syndrome: Secondary | ICD-10-CM | POA: Diagnosis not present

## 2021-02-13 DIAGNOSIS — J449 Chronic obstructive pulmonary disease, unspecified: Secondary | ICD-10-CM | POA: Diagnosis not present

## 2021-02-13 DIAGNOSIS — E782 Mixed hyperlipidemia: Secondary | ICD-10-CM | POA: Diagnosis not present

## 2021-02-13 DIAGNOSIS — Z87891 Personal history of nicotine dependence: Secondary | ICD-10-CM | POA: Diagnosis not present

## 2021-02-13 DIAGNOSIS — E119 Type 2 diabetes mellitus without complications: Secondary | ICD-10-CM | POA: Diagnosis not present

## 2021-02-13 DIAGNOSIS — K3184 Gastroparesis: Secondary | ICD-10-CM | POA: Diagnosis not present

## 2021-02-13 DIAGNOSIS — G250 Essential tremor: Secondary | ICD-10-CM | POA: Diagnosis not present

## 2021-02-13 DIAGNOSIS — R569 Unspecified convulsions: Secondary | ICD-10-CM | POA: Diagnosis not present

## 2021-02-13 DIAGNOSIS — I4891 Unspecified atrial fibrillation: Secondary | ICD-10-CM | POA: Diagnosis not present

## 2021-02-13 DIAGNOSIS — I10 Essential (primary) hypertension: Secondary | ICD-10-CM | POA: Diagnosis not present

## 2021-04-10 DIAGNOSIS — I129 Hypertensive chronic kidney disease with stage 1 through stage 4 chronic kidney disease, or unspecified chronic kidney disease: Secondary | ICD-10-CM | POA: Diagnosis not present

## 2021-04-10 DIAGNOSIS — M79605 Pain in left leg: Secondary | ICD-10-CM | POA: Diagnosis not present

## 2021-04-10 DIAGNOSIS — Z7982 Long term (current) use of aspirin: Secondary | ICD-10-CM | POA: Diagnosis not present

## 2021-04-10 DIAGNOSIS — I25118 Atherosclerotic heart disease of native coronary artery with other forms of angina pectoris: Secondary | ICD-10-CM | POA: Diagnosis not present

## 2021-04-10 DIAGNOSIS — I480 Paroxysmal atrial fibrillation: Secondary | ICD-10-CM | POA: Diagnosis not present

## 2021-04-10 DIAGNOSIS — E785 Hyperlipidemia, unspecified: Secondary | ICD-10-CM | POA: Diagnosis not present

## 2021-04-10 DIAGNOSIS — J449 Chronic obstructive pulmonary disease, unspecified: Secondary | ICD-10-CM | POA: Diagnosis not present

## 2021-04-10 DIAGNOSIS — E1122 Type 2 diabetes mellitus with diabetic chronic kidney disease: Secondary | ICD-10-CM | POA: Diagnosis not present

## 2021-04-10 DIAGNOSIS — M545 Low back pain, unspecified: Secondary | ICD-10-CM | POA: Diagnosis not present

## 2021-04-10 DIAGNOSIS — M79604 Pain in right leg: Secondary | ICD-10-CM | POA: Diagnosis not present

## 2021-04-10 DIAGNOSIS — G8929 Other chronic pain: Secondary | ICD-10-CM | POA: Diagnosis not present

## 2021-04-10 DIAGNOSIS — N183 Chronic kidney disease, stage 3 unspecified: Secondary | ICD-10-CM | POA: Diagnosis not present

## 2021-04-24 DIAGNOSIS — Z79899 Other long term (current) drug therapy: Secondary | ICD-10-CM | POA: Diagnosis not present

## 2021-04-24 DIAGNOSIS — F419 Anxiety disorder, unspecified: Secondary | ICD-10-CM | POA: Diagnosis not present

## 2021-04-24 DIAGNOSIS — M961 Postlaminectomy syndrome, not elsewhere classified: Secondary | ICD-10-CM | POA: Diagnosis not present

## 2021-04-24 DIAGNOSIS — Z79891 Long term (current) use of opiate analgesic: Secondary | ICD-10-CM | POA: Diagnosis not present

## 2021-04-24 DIAGNOSIS — M25559 Pain in unspecified hip: Secondary | ICD-10-CM | POA: Diagnosis not present

## 2021-04-24 DIAGNOSIS — M545 Low back pain, unspecified: Secondary | ICD-10-CM | POA: Diagnosis not present

## 2021-04-24 DIAGNOSIS — G894 Chronic pain syndrome: Secondary | ICD-10-CM | POA: Diagnosis not present

## 2021-05-16 DIAGNOSIS — I4891 Unspecified atrial fibrillation: Secondary | ICD-10-CM | POA: Diagnosis not present

## 2021-05-16 DIAGNOSIS — J449 Chronic obstructive pulmonary disease, unspecified: Secondary | ICD-10-CM | POA: Diagnosis not present

## 2021-05-16 DIAGNOSIS — E119 Type 2 diabetes mellitus without complications: Secondary | ICD-10-CM | POA: Diagnosis not present

## 2021-05-16 DIAGNOSIS — I10 Essential (primary) hypertension: Secondary | ICD-10-CM | POA: Diagnosis not present

## 2021-05-16 DIAGNOSIS — E782 Mixed hyperlipidemia: Secondary | ICD-10-CM | POA: Diagnosis not present

## 2021-05-16 DIAGNOSIS — Z87891 Personal history of nicotine dependence: Secondary | ICD-10-CM | POA: Diagnosis not present

## 2021-05-16 DIAGNOSIS — R569 Unspecified convulsions: Secondary | ICD-10-CM | POA: Diagnosis not present

## 2021-05-16 DIAGNOSIS — G250 Essential tremor: Secondary | ICD-10-CM | POA: Diagnosis not present

## 2021-05-16 DIAGNOSIS — K3184 Gastroparesis: Secondary | ICD-10-CM | POA: Diagnosis not present

## 2021-05-22 DIAGNOSIS — Z79891 Long term (current) use of opiate analgesic: Secondary | ICD-10-CM | POA: Diagnosis not present

## 2021-05-22 DIAGNOSIS — M961 Postlaminectomy syndrome, not elsewhere classified: Secondary | ICD-10-CM | POA: Diagnosis not present

## 2021-05-22 DIAGNOSIS — M545 Low back pain, unspecified: Secondary | ICD-10-CM | POA: Diagnosis not present

## 2021-05-22 DIAGNOSIS — G894 Chronic pain syndrome: Secondary | ICD-10-CM | POA: Diagnosis not present

## 2021-05-22 DIAGNOSIS — M25559 Pain in unspecified hip: Secondary | ICD-10-CM | POA: Diagnosis not present

## 2021-05-22 DIAGNOSIS — F419 Anxiety disorder, unspecified: Secondary | ICD-10-CM | POA: Diagnosis not present

## 2021-05-22 DIAGNOSIS — Z79899 Other long term (current) drug therapy: Secondary | ICD-10-CM | POA: Diagnosis not present

## 2021-06-19 DIAGNOSIS — Z79891 Long term (current) use of opiate analgesic: Secondary | ICD-10-CM | POA: Diagnosis not present

## 2021-06-19 DIAGNOSIS — M25559 Pain in unspecified hip: Secondary | ICD-10-CM | POA: Diagnosis not present

## 2021-06-19 DIAGNOSIS — M545 Low back pain, unspecified: Secondary | ICD-10-CM | POA: Diagnosis not present

## 2021-06-19 DIAGNOSIS — F419 Anxiety disorder, unspecified: Secondary | ICD-10-CM | POA: Diagnosis not present

## 2021-06-19 DIAGNOSIS — M961 Postlaminectomy syndrome, not elsewhere classified: Secondary | ICD-10-CM | POA: Diagnosis not present

## 2021-06-19 DIAGNOSIS — G894 Chronic pain syndrome: Secondary | ICD-10-CM | POA: Diagnosis not present

## 2021-07-14 IMAGING — CR DG LUMBAR SPINE COMPLETE 4+V
5 series · 5 of 5 positions shown · non-contrast
Comparison: None.

CLINICAL DATA: Chronic low back pain without known injury.

EXAM:
LUMBAR SPINE - COMPLETE 4+ VIEW

[t l-spine a.p.]
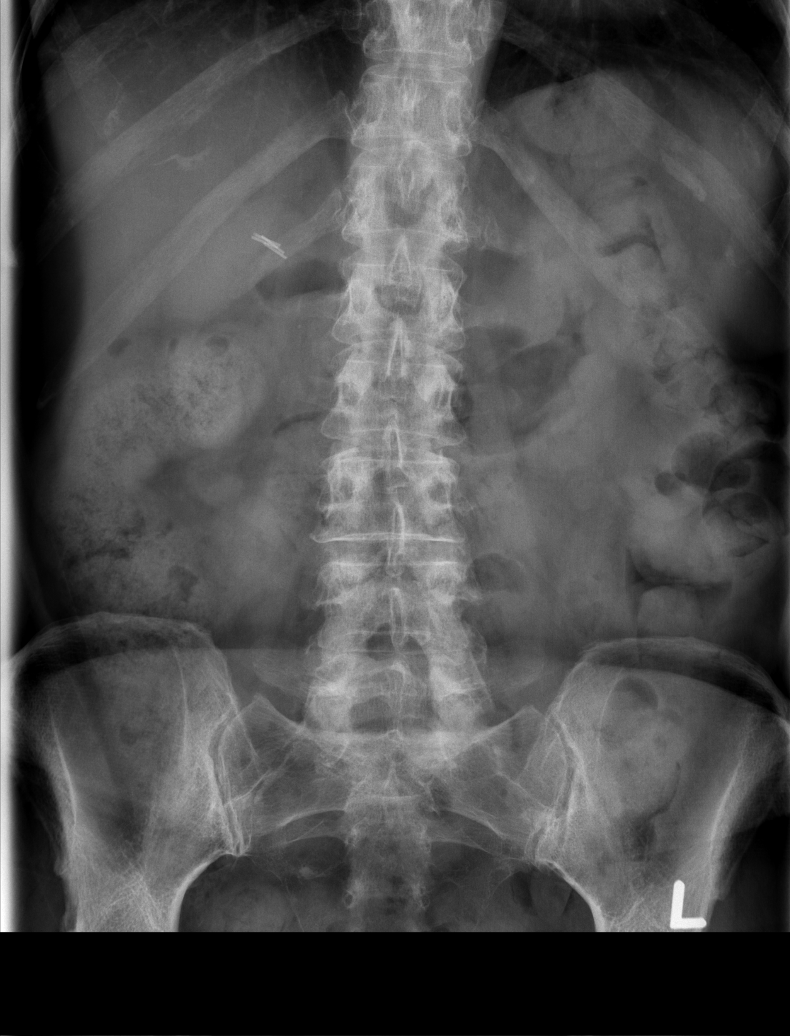

[t l-spine oblique exposure (1 of 2)]
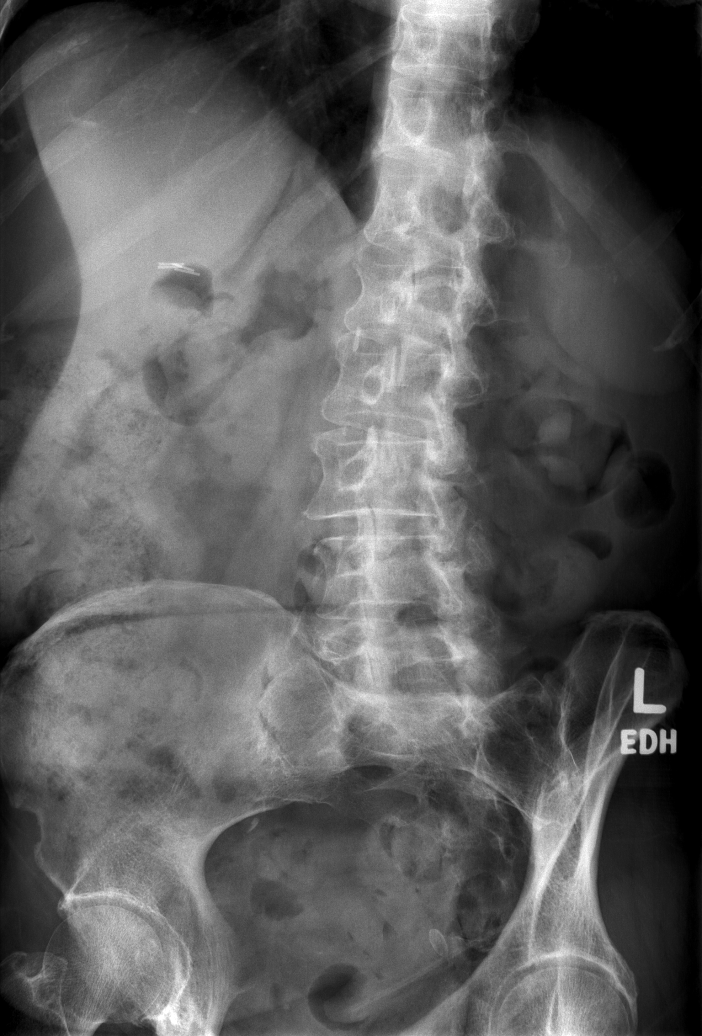

[t l-spine oblique exposure (2 of 2)]
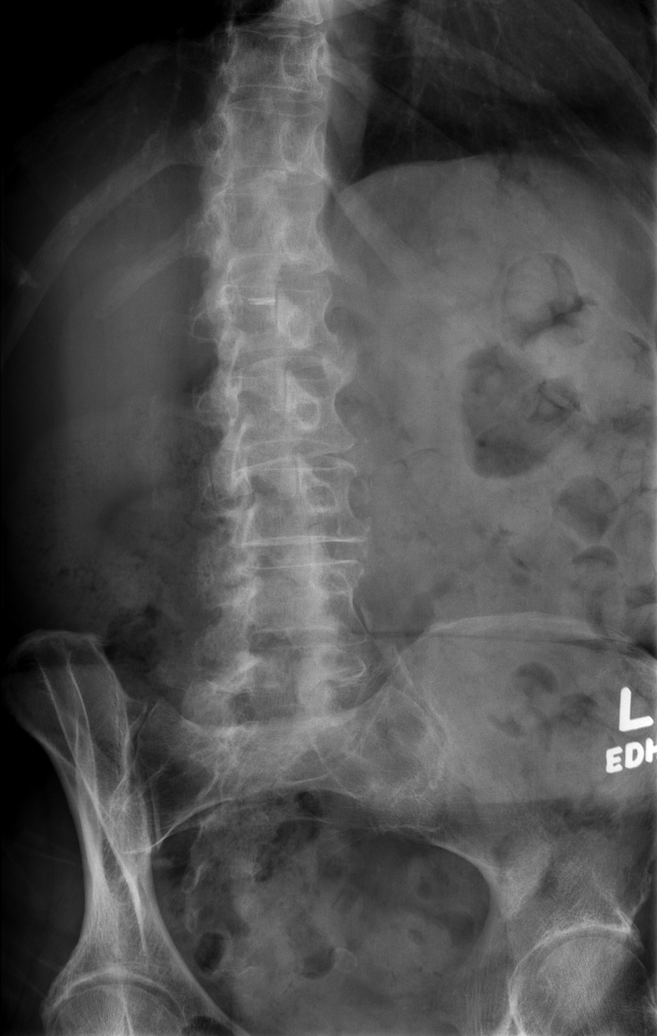

[t l-spine lat]
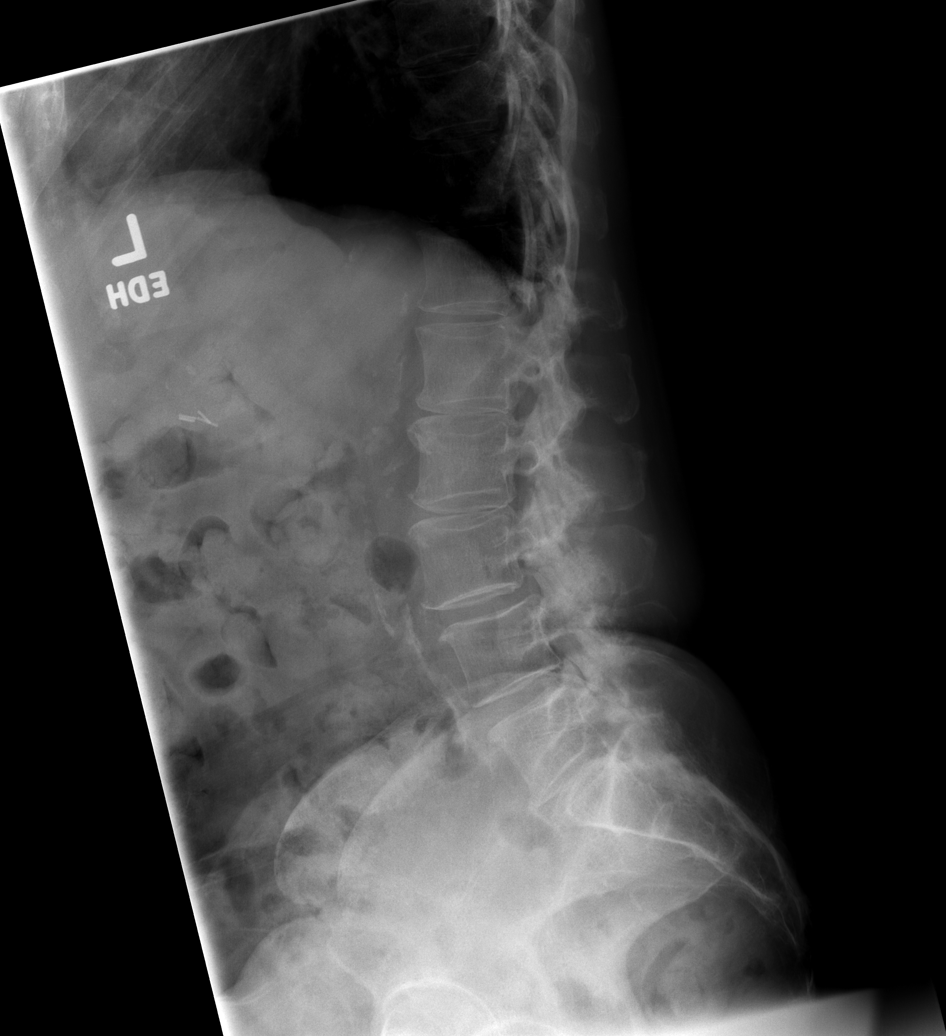

[t l-spine l5-s1 spot]
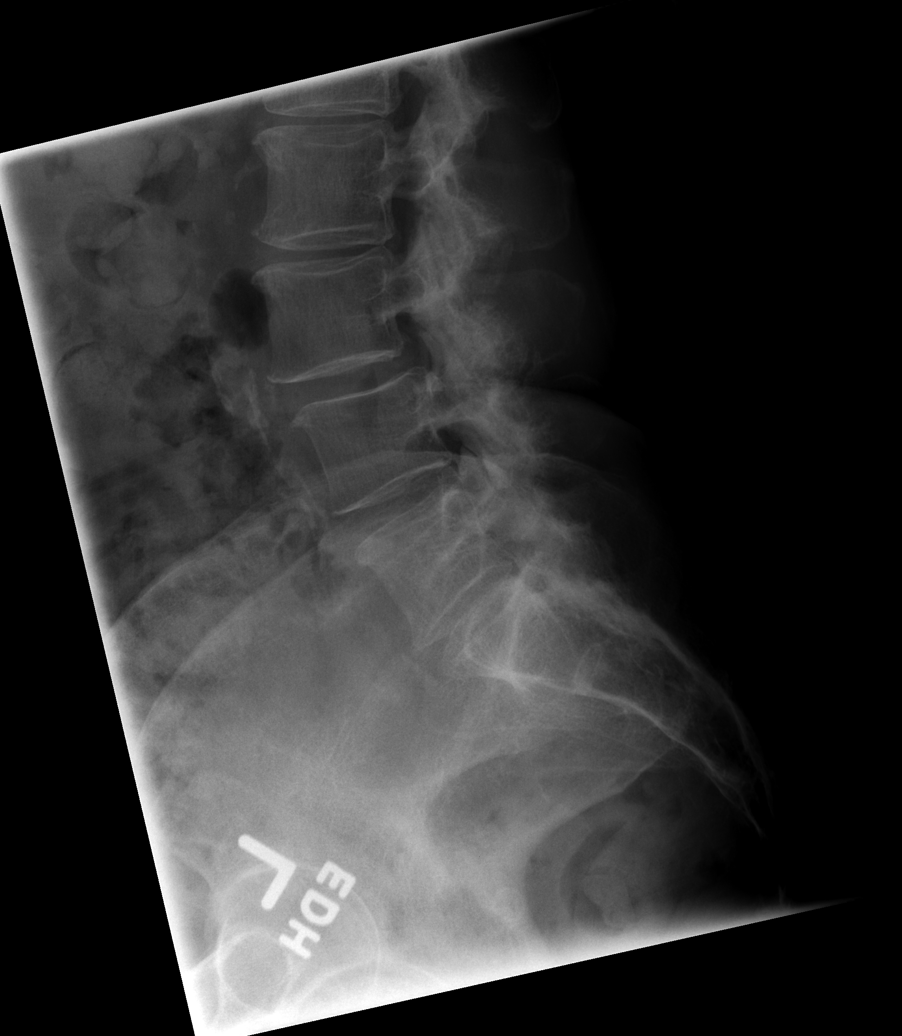

[5 of 5 positions shown; findings below may reference images not displayed]

FINDINGS: Minimal grade 1 anterolisthesis of L3-4 is noted secondary to
posterior facet joint hypertrophy. Mild degenerative disc disease is
noted at L1-2 and L2-3 and L3-4 with anterior osteophyte formation.
No fracture is noted.
IMPRESSION: Mild multilevel degenerative disc disease. No acute abnormality seen
in the lumbar spine.

## 2021-07-17 DIAGNOSIS — G894 Chronic pain syndrome: Secondary | ICD-10-CM | POA: Diagnosis not present

## 2021-07-17 DIAGNOSIS — Z79891 Long term (current) use of opiate analgesic: Secondary | ICD-10-CM | POA: Diagnosis not present

## 2021-07-17 DIAGNOSIS — M25559 Pain in unspecified hip: Secondary | ICD-10-CM | POA: Diagnosis not present

## 2021-07-17 DIAGNOSIS — M545 Low back pain, unspecified: Secondary | ICD-10-CM | POA: Diagnosis not present

## 2021-07-17 DIAGNOSIS — M961 Postlaminectomy syndrome, not elsewhere classified: Secondary | ICD-10-CM | POA: Diagnosis not present

## 2021-07-17 DIAGNOSIS — F419 Anxiety disorder, unspecified: Secondary | ICD-10-CM | POA: Diagnosis not present

## 2021-08-12 ENCOUNTER — Other Ambulatory Visit: Payer: Self-pay | Admitting: Physician Assistant

## 2021-09-03 DIAGNOSIS — E782 Mixed hyperlipidemia: Secondary | ICD-10-CM | POA: Diagnosis not present

## 2021-09-03 DIAGNOSIS — Z87891 Personal history of nicotine dependence: Secondary | ICD-10-CM | POA: Diagnosis not present

## 2021-09-03 DIAGNOSIS — R569 Unspecified convulsions: Secondary | ICD-10-CM | POA: Diagnosis not present

## 2021-09-03 DIAGNOSIS — J449 Chronic obstructive pulmonary disease, unspecified: Secondary | ICD-10-CM | POA: Diagnosis not present

## 2021-09-03 DIAGNOSIS — Z23 Encounter for immunization: Secondary | ICD-10-CM | POA: Diagnosis not present

## 2021-09-03 DIAGNOSIS — G250 Essential tremor: Secondary | ICD-10-CM | POA: Diagnosis not present

## 2021-09-03 DIAGNOSIS — K3184 Gastroparesis: Secondary | ICD-10-CM | POA: Diagnosis not present

## 2021-09-03 DIAGNOSIS — I4891 Unspecified atrial fibrillation: Secondary | ICD-10-CM | POA: Diagnosis not present

## 2021-09-03 DIAGNOSIS — E119 Type 2 diabetes mellitus without complications: Secondary | ICD-10-CM | POA: Diagnosis not present

## 2021-09-03 DIAGNOSIS — I10 Essential (primary) hypertension: Secondary | ICD-10-CM | POA: Diagnosis not present

## 2021-09-19 DIAGNOSIS — M25559 Pain in unspecified hip: Secondary | ICD-10-CM | POA: Diagnosis not present

## 2021-09-19 DIAGNOSIS — F419 Anxiety disorder, unspecified: Secondary | ICD-10-CM | POA: Diagnosis not present

## 2021-09-19 DIAGNOSIS — M961 Postlaminectomy syndrome, not elsewhere classified: Secondary | ICD-10-CM | POA: Diagnosis not present

## 2021-09-19 DIAGNOSIS — Z79891 Long term (current) use of opiate analgesic: Secondary | ICD-10-CM | POA: Diagnosis not present

## 2021-09-19 DIAGNOSIS — Z79899 Other long term (current) drug therapy: Secondary | ICD-10-CM | POA: Diagnosis not present

## 2021-09-19 DIAGNOSIS — G894 Chronic pain syndrome: Secondary | ICD-10-CM | POA: Diagnosis not present

## 2021-09-19 DIAGNOSIS — M545 Low back pain, unspecified: Secondary | ICD-10-CM | POA: Diagnosis not present

## 2021-10-10 ENCOUNTER — Other Ambulatory Visit: Payer: Self-pay | Admitting: Physician Assistant

## 2021-10-10 DIAGNOSIS — Z1231 Encounter for screening mammogram for malignant neoplasm of breast: Secondary | ICD-10-CM

## 2021-12-03 DIAGNOSIS — Z87891 Personal history of nicotine dependence: Secondary | ICD-10-CM | POA: Diagnosis not present

## 2021-12-03 DIAGNOSIS — J449 Chronic obstructive pulmonary disease, unspecified: Secondary | ICD-10-CM | POA: Diagnosis not present

## 2021-12-03 DIAGNOSIS — E119 Type 2 diabetes mellitus without complications: Secondary | ICD-10-CM | POA: Diagnosis not present

## 2021-12-03 DIAGNOSIS — I4891 Unspecified atrial fibrillation: Secondary | ICD-10-CM | POA: Diagnosis not present

## 2021-12-03 DIAGNOSIS — K3184 Gastroparesis: Secondary | ICD-10-CM | POA: Diagnosis not present

## 2021-12-03 DIAGNOSIS — R569 Unspecified convulsions: Secondary | ICD-10-CM | POA: Diagnosis not present

## 2021-12-03 DIAGNOSIS — E782 Mixed hyperlipidemia: Secondary | ICD-10-CM | POA: Diagnosis not present

## 2021-12-03 DIAGNOSIS — G250 Essential tremor: Secondary | ICD-10-CM | POA: Diagnosis not present

## 2021-12-03 DIAGNOSIS — I10 Essential (primary) hypertension: Secondary | ICD-10-CM | POA: Diagnosis not present

## 2021-12-04 DIAGNOSIS — N1831 Chronic kidney disease, stage 3a: Secondary | ICD-10-CM | POA: Diagnosis not present

## 2021-12-11 DIAGNOSIS — I129 Hypertensive chronic kidney disease with stage 1 through stage 4 chronic kidney disease, or unspecified chronic kidney disease: Secondary | ICD-10-CM | POA: Diagnosis not present

## 2021-12-11 DIAGNOSIS — N200 Calculus of kidney: Secondary | ICD-10-CM | POA: Diagnosis not present

## 2021-12-11 DIAGNOSIS — R600 Localized edema: Secondary | ICD-10-CM | POA: Diagnosis not present

## 2021-12-11 DIAGNOSIS — E1122 Type 2 diabetes mellitus with diabetic chronic kidney disease: Secondary | ICD-10-CM | POA: Diagnosis not present

## 2021-12-11 DIAGNOSIS — E559 Vitamin D deficiency, unspecified: Secondary | ICD-10-CM | POA: Diagnosis not present

## 2021-12-11 DIAGNOSIS — N1831 Chronic kidney disease, stage 3a: Secondary | ICD-10-CM | POA: Diagnosis not present

## 2021-12-12 ENCOUNTER — Other Ambulatory Visit: Payer: Self-pay | Admitting: Nephrology

## 2021-12-12 ENCOUNTER — Other Ambulatory Visit: Payer: PPO

## 2021-12-12 DIAGNOSIS — F419 Anxiety disorder, unspecified: Secondary | ICD-10-CM | POA: Diagnosis not present

## 2021-12-12 DIAGNOSIS — Z79891 Long term (current) use of opiate analgesic: Secondary | ICD-10-CM | POA: Diagnosis not present

## 2021-12-12 DIAGNOSIS — M25559 Pain in unspecified hip: Secondary | ICD-10-CM | POA: Diagnosis not present

## 2021-12-12 DIAGNOSIS — R600 Localized edema: Secondary | ICD-10-CM

## 2021-12-12 DIAGNOSIS — Z79899 Other long term (current) drug therapy: Secondary | ICD-10-CM | POA: Diagnosis not present

## 2021-12-12 DIAGNOSIS — M545 Low back pain, unspecified: Secondary | ICD-10-CM | POA: Diagnosis not present

## 2021-12-12 DIAGNOSIS — M961 Postlaminectomy syndrome, not elsewhere classified: Secondary | ICD-10-CM | POA: Diagnosis not present

## 2021-12-12 DIAGNOSIS — G894 Chronic pain syndrome: Secondary | ICD-10-CM | POA: Diagnosis not present

## 2021-12-17 ENCOUNTER — Ambulatory Visit
Admission: RE | Admit: 2021-12-17 | Discharge: 2021-12-17 | Disposition: A | Discharge status: Home / Self Care | Payer: PPO | Source: Ambulatory Visit | Attending: Nephrology | Admitting: Nephrology

## 2021-12-17 DIAGNOSIS — R600 Localized edema: Secondary | ICD-10-CM | POA: Diagnosis not present

## 2022-01-17 DIAGNOSIS — N183 Chronic kidney disease, stage 3 unspecified: Secondary | ICD-10-CM | POA: Diagnosis not present

## 2022-01-17 DIAGNOSIS — M545 Low back pain, unspecified: Secondary | ICD-10-CM | POA: Diagnosis not present

## 2022-01-17 DIAGNOSIS — I480 Paroxysmal atrial fibrillation: Secondary | ICD-10-CM | POA: Diagnosis not present

## 2022-01-17 DIAGNOSIS — M79605 Pain in left leg: Secondary | ICD-10-CM | POA: Diagnosis not present

## 2022-01-17 DIAGNOSIS — I10 Essential (primary) hypertension: Secondary | ICD-10-CM | POA: Diagnosis not present

## 2022-01-17 DIAGNOSIS — G4700 Insomnia, unspecified: Secondary | ICD-10-CM | POA: Diagnosis not present

## 2022-01-17 DIAGNOSIS — Z7982 Long term (current) use of aspirin: Secondary | ICD-10-CM | POA: Diagnosis not present

## 2022-01-17 DIAGNOSIS — I25118 Atherosclerotic heart disease of native coronary artery with other forms of angina pectoris: Secondary | ICD-10-CM | POA: Diagnosis not present

## 2022-01-17 DIAGNOSIS — E1122 Type 2 diabetes mellitus with diabetic chronic kidney disease: Secondary | ICD-10-CM | POA: Diagnosis not present

## 2022-01-17 DIAGNOSIS — J449 Chronic obstructive pulmonary disease, unspecified: Secondary | ICD-10-CM | POA: Diagnosis not present

## 2022-01-17 DIAGNOSIS — R569 Unspecified convulsions: Secondary | ICD-10-CM | POA: Diagnosis not present

## 2022-01-17 DIAGNOSIS — G8929 Other chronic pain: Secondary | ICD-10-CM | POA: Diagnosis not present

## 2022-02-18 ENCOUNTER — Encounter: Payer: Self-pay | Admitting: Physician Assistant

## 2022-03-04 DIAGNOSIS — J449 Chronic obstructive pulmonary disease, unspecified: Secondary | ICD-10-CM | POA: Diagnosis not present

## 2022-03-04 DIAGNOSIS — I4891 Unspecified atrial fibrillation: Secondary | ICD-10-CM | POA: Diagnosis not present

## 2022-03-04 DIAGNOSIS — R569 Unspecified convulsions: Secondary | ICD-10-CM | POA: Diagnosis not present

## 2022-03-04 DIAGNOSIS — E119 Type 2 diabetes mellitus without complications: Secondary | ICD-10-CM | POA: Diagnosis not present

## 2022-03-04 DIAGNOSIS — I10 Essential (primary) hypertension: Secondary | ICD-10-CM | POA: Diagnosis not present

## 2022-03-04 DIAGNOSIS — K3184 Gastroparesis: Secondary | ICD-10-CM | POA: Diagnosis not present

## 2022-03-04 DIAGNOSIS — G250 Essential tremor: Secondary | ICD-10-CM | POA: Diagnosis not present

## 2022-03-04 DIAGNOSIS — B3731 Acute candidiasis of vulva and vagina: Secondary | ICD-10-CM | POA: Diagnosis not present

## 2022-03-04 DIAGNOSIS — Z87891 Personal history of nicotine dependence: Secondary | ICD-10-CM | POA: Diagnosis not present

## 2022-03-04 DIAGNOSIS — E782 Mixed hyperlipidemia: Secondary | ICD-10-CM | POA: Diagnosis not present

## 2022-03-20 DIAGNOSIS — M25559 Pain in unspecified hip: Secondary | ICD-10-CM | POA: Diagnosis not present

## 2022-03-20 DIAGNOSIS — G894 Chronic pain syndrome: Secondary | ICD-10-CM | POA: Diagnosis not present

## 2022-03-20 DIAGNOSIS — Z79891 Long term (current) use of opiate analgesic: Secondary | ICD-10-CM | POA: Diagnosis not present

## 2022-03-20 DIAGNOSIS — Z79899 Other long term (current) drug therapy: Secondary | ICD-10-CM | POA: Diagnosis not present

## 2022-03-20 DIAGNOSIS — M545 Low back pain, unspecified: Secondary | ICD-10-CM | POA: Diagnosis not present

## 2022-03-20 DIAGNOSIS — M961 Postlaminectomy syndrome, not elsewhere classified: Secondary | ICD-10-CM | POA: Diagnosis not present

## 2022-03-20 DIAGNOSIS — F419 Anxiety disorder, unspecified: Secondary | ICD-10-CM | POA: Diagnosis not present

## 2022-04-24 DIAGNOSIS — I480 Paroxysmal atrial fibrillation: Secondary | ICD-10-CM | POA: Diagnosis not present

## 2022-04-24 DIAGNOSIS — M79605 Pain in left leg: Secondary | ICD-10-CM | POA: Diagnosis not present

## 2022-04-24 DIAGNOSIS — G8929 Other chronic pain: Secondary | ICD-10-CM | POA: Diagnosis not present

## 2022-04-24 DIAGNOSIS — Z79891 Long term (current) use of opiate analgesic: Secondary | ICD-10-CM | POA: Diagnosis not present

## 2022-04-24 DIAGNOSIS — Z7982 Long term (current) use of aspirin: Secondary | ICD-10-CM | POA: Diagnosis not present

## 2022-04-24 DIAGNOSIS — J449 Chronic obstructive pulmonary disease, unspecified: Secondary | ICD-10-CM | POA: Diagnosis not present

## 2022-04-24 DIAGNOSIS — E119 Type 2 diabetes mellitus without complications: Secondary | ICD-10-CM | POA: Diagnosis not present

## 2022-04-24 DIAGNOSIS — Z7984 Long term (current) use of oral hypoglycemic drugs: Secondary | ICD-10-CM | POA: Diagnosis not present

## 2022-04-24 DIAGNOSIS — M545 Low back pain, unspecified: Secondary | ICD-10-CM | POA: Diagnosis not present

## 2022-06-03 DIAGNOSIS — I10 Essential (primary) hypertension: Secondary | ICD-10-CM | POA: Diagnosis not present

## 2022-06-03 DIAGNOSIS — I4891 Unspecified atrial fibrillation: Secondary | ICD-10-CM | POA: Diagnosis not present

## 2022-06-03 DIAGNOSIS — K3184 Gastroparesis: Secondary | ICD-10-CM | POA: Diagnosis not present

## 2022-06-03 DIAGNOSIS — J449 Chronic obstructive pulmonary disease, unspecified: Secondary | ICD-10-CM | POA: Diagnosis not present

## 2022-06-03 DIAGNOSIS — R569 Unspecified convulsions: Secondary | ICD-10-CM | POA: Diagnosis not present

## 2022-06-03 DIAGNOSIS — E782 Mixed hyperlipidemia: Secondary | ICD-10-CM | POA: Diagnosis not present

## 2022-06-03 DIAGNOSIS — N1831 Chronic kidney disease, stage 3a: Secondary | ICD-10-CM | POA: Diagnosis not present

## 2022-06-03 DIAGNOSIS — G250 Essential tremor: Secondary | ICD-10-CM | POA: Diagnosis not present

## 2022-06-03 DIAGNOSIS — Z87891 Personal history of nicotine dependence: Secondary | ICD-10-CM | POA: Diagnosis not present

## 2022-06-03 DIAGNOSIS — E119 Type 2 diabetes mellitus without complications: Secondary | ICD-10-CM | POA: Diagnosis not present

## 2022-06-12 DIAGNOSIS — M961 Postlaminectomy syndrome, not elsewhere classified: Secondary | ICD-10-CM | POA: Diagnosis not present

## 2022-06-12 DIAGNOSIS — M25559 Pain in unspecified hip: Secondary | ICD-10-CM | POA: Diagnosis not present

## 2022-06-12 DIAGNOSIS — Z79899 Other long term (current) drug therapy: Secondary | ICD-10-CM | POA: Diagnosis not present

## 2022-06-12 DIAGNOSIS — G894 Chronic pain syndrome: Secondary | ICD-10-CM | POA: Diagnosis not present

## 2022-06-12 DIAGNOSIS — M545 Low back pain, unspecified: Secondary | ICD-10-CM | POA: Diagnosis not present

## 2022-06-12 DIAGNOSIS — F419 Anxiety disorder, unspecified: Secondary | ICD-10-CM | POA: Diagnosis not present

## 2022-06-12 DIAGNOSIS — Z79891 Long term (current) use of opiate analgesic: Secondary | ICD-10-CM | POA: Diagnosis not present

## 2022-07-25 DIAGNOSIS — E782 Mixed hyperlipidemia: Secondary | ICD-10-CM | POA: Diagnosis not present

## 2022-07-25 DIAGNOSIS — E119 Type 2 diabetes mellitus without complications: Secondary | ICD-10-CM | POA: Diagnosis not present

## 2022-07-25 DIAGNOSIS — I119 Hypertensive heart disease without heart failure: Secondary | ICD-10-CM | POA: Diagnosis not present

## 2022-07-25 DIAGNOSIS — J449 Chronic obstructive pulmonary disease, unspecified: Secondary | ICD-10-CM | POA: Diagnosis not present

## 2022-08-24 DIAGNOSIS — I119 Hypertensive heart disease without heart failure: Secondary | ICD-10-CM | POA: Diagnosis not present

## 2022-08-24 DIAGNOSIS — E782 Mixed hyperlipidemia: Secondary | ICD-10-CM | POA: Diagnosis not present

## 2022-08-24 DIAGNOSIS — J449 Chronic obstructive pulmonary disease, unspecified: Secondary | ICD-10-CM | POA: Diagnosis not present

## 2022-08-24 DIAGNOSIS — E119 Type 2 diabetes mellitus without complications: Secondary | ICD-10-CM | POA: Diagnosis not present

## 2022-08-27 DIAGNOSIS — G250 Essential tremor: Secondary | ICD-10-CM | POA: Diagnosis not present

## 2022-08-27 DIAGNOSIS — R569 Unspecified convulsions: Secondary | ICD-10-CM | POA: Diagnosis not present

## 2022-08-27 DIAGNOSIS — I10 Essential (primary) hypertension: Secondary | ICD-10-CM | POA: Diagnosis not present

## 2022-08-27 DIAGNOSIS — Z0000 Encounter for general adult medical examination without abnormal findings: Secondary | ICD-10-CM | POA: Diagnosis not present

## 2022-08-27 DIAGNOSIS — J449 Chronic obstructive pulmonary disease, unspecified: Secondary | ICD-10-CM | POA: Diagnosis not present

## 2022-08-27 DIAGNOSIS — E119 Type 2 diabetes mellitus without complications: Secondary | ICD-10-CM | POA: Diagnosis not present

## 2022-08-27 DIAGNOSIS — E782 Mixed hyperlipidemia: Secondary | ICD-10-CM | POA: Diagnosis not present

## 2022-08-27 DIAGNOSIS — Z87891 Personal history of nicotine dependence: Secondary | ICD-10-CM | POA: Diagnosis not present

## 2022-08-27 DIAGNOSIS — K3184 Gastroparesis: Secondary | ICD-10-CM | POA: Diagnosis not present

## 2022-08-27 DIAGNOSIS — N1831 Chronic kidney disease, stage 3a: Secondary | ICD-10-CM | POA: Diagnosis not present

## 2022-08-27 DIAGNOSIS — I4891 Unspecified atrial fibrillation: Secondary | ICD-10-CM | POA: Diagnosis not present

## 2022-09-02 DIAGNOSIS — H2513 Age-related nuclear cataract, bilateral: Secondary | ICD-10-CM | POA: Diagnosis not present

## 2022-09-02 DIAGNOSIS — H52223 Regular astigmatism, bilateral: Secondary | ICD-10-CM | POA: Diagnosis not present

## 2022-09-02 DIAGNOSIS — H43822 Vitreomacular adhesion, left eye: Secondary | ICD-10-CM | POA: Diagnosis not present

## 2022-09-02 DIAGNOSIS — E119 Type 2 diabetes mellitus without complications: Secondary | ICD-10-CM | POA: Diagnosis not present

## 2022-09-02 DIAGNOSIS — H40053 Ocular hypertension, bilateral: Secondary | ICD-10-CM | POA: Diagnosis not present

## 2022-09-02 DIAGNOSIS — H524 Presbyopia: Secondary | ICD-10-CM | POA: Diagnosis not present

## 2022-09-02 DIAGNOSIS — H40023 Open angle with borderline findings, high risk, bilateral: Secondary | ICD-10-CM | POA: Diagnosis not present

## 2022-09-03 DIAGNOSIS — Z23 Encounter for immunization: Secondary | ICD-10-CM | POA: Diagnosis not present

## 2022-09-03 DIAGNOSIS — G250 Essential tremor: Secondary | ICD-10-CM | POA: Diagnosis not present

## 2022-09-03 DIAGNOSIS — E119 Type 2 diabetes mellitus without complications: Secondary | ICD-10-CM | POA: Diagnosis not present

## 2022-09-03 DIAGNOSIS — I4891 Unspecified atrial fibrillation: Secondary | ICD-10-CM | POA: Diagnosis not present

## 2022-09-03 DIAGNOSIS — I10 Essential (primary) hypertension: Secondary | ICD-10-CM | POA: Diagnosis not present

## 2022-09-03 DIAGNOSIS — Z0001 Encounter for general adult medical examination with abnormal findings: Secondary | ICD-10-CM | POA: Diagnosis not present

## 2022-09-03 DIAGNOSIS — K3184 Gastroparesis: Secondary | ICD-10-CM | POA: Diagnosis not present

## 2022-09-03 DIAGNOSIS — R569 Unspecified convulsions: Secondary | ICD-10-CM | POA: Diagnosis not present

## 2022-09-03 DIAGNOSIS — N1831 Chronic kidney disease, stage 3a: Secondary | ICD-10-CM | POA: Diagnosis not present

## 2022-09-03 DIAGNOSIS — J449 Chronic obstructive pulmonary disease, unspecified: Secondary | ICD-10-CM | POA: Diagnosis not present

## 2022-09-03 DIAGNOSIS — E039 Hypothyroidism, unspecified: Secondary | ICD-10-CM | POA: Diagnosis not present

## 2022-09-03 DIAGNOSIS — E782 Mixed hyperlipidemia: Secondary | ICD-10-CM | POA: Diagnosis not present

## 2022-09-11 DIAGNOSIS — G894 Chronic pain syndrome: Secondary | ICD-10-CM | POA: Diagnosis not present

## 2022-09-11 DIAGNOSIS — M961 Postlaminectomy syndrome, not elsewhere classified: Secondary | ICD-10-CM | POA: Diagnosis not present

## 2022-09-11 DIAGNOSIS — Z79899 Other long term (current) drug therapy: Secondary | ICD-10-CM | POA: Diagnosis not present

## 2022-09-11 DIAGNOSIS — F419 Anxiety disorder, unspecified: Secondary | ICD-10-CM | POA: Diagnosis not present

## 2022-09-11 DIAGNOSIS — Z79891 Long term (current) use of opiate analgesic: Secondary | ICD-10-CM | POA: Diagnosis not present

## 2022-09-11 DIAGNOSIS — M25559 Pain in unspecified hip: Secondary | ICD-10-CM | POA: Diagnosis not present

## 2022-10-01 ENCOUNTER — Other Ambulatory Visit: Payer: Self-pay | Admitting: Physician Assistant

## 2022-10-01 DIAGNOSIS — J449 Chronic obstructive pulmonary disease, unspecified: Secondary | ICD-10-CM | POA: Diagnosis not present

## 2022-10-01 DIAGNOSIS — Z1231 Encounter for screening mammogram for malignant neoplasm of breast: Secondary | ICD-10-CM

## 2022-10-01 DIAGNOSIS — G250 Essential tremor: Secondary | ICD-10-CM | POA: Diagnosis not present

## 2022-10-01 DIAGNOSIS — I4891 Unspecified atrial fibrillation: Secondary | ICD-10-CM | POA: Diagnosis not present

## 2022-10-01 DIAGNOSIS — I10 Essential (primary) hypertension: Secondary | ICD-10-CM | POA: Diagnosis not present

## 2022-10-01 DIAGNOSIS — R569 Unspecified convulsions: Secondary | ICD-10-CM | POA: Diagnosis not present

## 2022-10-01 DIAGNOSIS — E119 Type 2 diabetes mellitus without complications: Secondary | ICD-10-CM | POA: Diagnosis not present

## 2022-10-01 DIAGNOSIS — N1831 Chronic kidney disease, stage 3a: Secondary | ICD-10-CM | POA: Diagnosis not present

## 2022-10-01 DIAGNOSIS — E039 Hypothyroidism, unspecified: Secondary | ICD-10-CM | POA: Diagnosis not present

## 2022-10-01 DIAGNOSIS — K3184 Gastroparesis: Secondary | ICD-10-CM | POA: Diagnosis not present

## 2022-10-01 DIAGNOSIS — E782 Mixed hyperlipidemia: Secondary | ICD-10-CM | POA: Diagnosis not present

## 2022-10-04 DIAGNOSIS — G8929 Other chronic pain: Secondary | ICD-10-CM | POA: Diagnosis not present

## 2022-10-04 DIAGNOSIS — M79605 Pain in left leg: Secondary | ICD-10-CM | POA: Diagnosis not present

## 2022-10-04 DIAGNOSIS — M545 Low back pain, unspecified: Secondary | ICD-10-CM | POA: Diagnosis not present

## 2022-10-04 DIAGNOSIS — I10 Essential (primary) hypertension: Secondary | ICD-10-CM | POA: Diagnosis not present

## 2022-10-04 DIAGNOSIS — I480 Paroxysmal atrial fibrillation: Secondary | ICD-10-CM | POA: Diagnosis not present

## 2022-10-04 DIAGNOSIS — Z7982 Long term (current) use of aspirin: Secondary | ICD-10-CM | POA: Diagnosis not present

## 2022-10-24 DIAGNOSIS — E782 Mixed hyperlipidemia: Secondary | ICD-10-CM | POA: Diagnosis not present

## 2022-10-24 DIAGNOSIS — E119 Type 2 diabetes mellitus without complications: Secondary | ICD-10-CM | POA: Diagnosis not present

## 2022-10-24 DIAGNOSIS — I119 Hypertensive heart disease without heart failure: Secondary | ICD-10-CM | POA: Diagnosis not present

## 2022-10-24 DIAGNOSIS — J449 Chronic obstructive pulmonary disease, unspecified: Secondary | ICD-10-CM | POA: Diagnosis not present

## 2022-11-06 DIAGNOSIS — M25559 Pain in unspecified hip: Secondary | ICD-10-CM | POA: Diagnosis not present

## 2022-11-06 DIAGNOSIS — M961 Postlaminectomy syndrome, not elsewhere classified: Secondary | ICD-10-CM | POA: Diagnosis not present

## 2022-11-06 DIAGNOSIS — F419 Anxiety disorder, unspecified: Secondary | ICD-10-CM | POA: Diagnosis not present

## 2022-11-06 DIAGNOSIS — Z79891 Long term (current) use of opiate analgesic: Secondary | ICD-10-CM | POA: Diagnosis not present

## 2022-11-06 DIAGNOSIS — G894 Chronic pain syndrome: Secondary | ICD-10-CM | POA: Diagnosis not present

## 2022-11-06 DIAGNOSIS — Z79899 Other long term (current) drug therapy: Secondary | ICD-10-CM | POA: Diagnosis not present

## 2022-11-29 DIAGNOSIS — N1831 Chronic kidney disease, stage 3a: Secondary | ICD-10-CM | POA: Diagnosis not present

## 2022-12-03 ENCOUNTER — Ambulatory Visit: Payer: PPO

## 2022-12-05 DIAGNOSIS — R809 Proteinuria, unspecified: Secondary | ICD-10-CM | POA: Diagnosis not present

## 2022-12-05 DIAGNOSIS — E1122 Type 2 diabetes mellitus with diabetic chronic kidney disease: Secondary | ICD-10-CM | POA: Diagnosis not present

## 2022-12-05 DIAGNOSIS — N200 Calculus of kidney: Secondary | ICD-10-CM | POA: Diagnosis not present

## 2022-12-05 DIAGNOSIS — I129 Hypertensive chronic kidney disease with stage 1 through stage 4 chronic kidney disease, or unspecified chronic kidney disease: Secondary | ICD-10-CM | POA: Diagnosis not present

## 2022-12-05 DIAGNOSIS — E559 Vitamin D deficiency, unspecified: Secondary | ICD-10-CM | POA: Diagnosis not present

## 2022-12-05 DIAGNOSIS — N1831 Chronic kidney disease, stage 3a: Secondary | ICD-10-CM | POA: Diagnosis not present

## 2023-01-01 DIAGNOSIS — Z79891 Long term (current) use of opiate analgesic: Secondary | ICD-10-CM | POA: Diagnosis not present

## 2023-01-01 DIAGNOSIS — G894 Chronic pain syndrome: Secondary | ICD-10-CM | POA: Diagnosis not present

## 2023-01-01 DIAGNOSIS — M961 Postlaminectomy syndrome, not elsewhere classified: Secondary | ICD-10-CM | POA: Diagnosis not present

## 2023-01-01 DIAGNOSIS — Z79899 Other long term (current) drug therapy: Secondary | ICD-10-CM | POA: Diagnosis not present

## 2023-01-14 DIAGNOSIS — H40053 Ocular hypertension, bilateral: Secondary | ICD-10-CM | POA: Diagnosis not present

## 2023-01-23 ENCOUNTER — Ambulatory Visit
Admission: RE | Admit: 2023-01-23 | Discharge: 2023-01-23 | Disposition: A | Discharge status: Home / Self Care | Payer: PPO | Source: Ambulatory Visit | Attending: Physician Assistant | Admitting: Physician Assistant

## 2023-01-23 DIAGNOSIS — Z1231 Encounter for screening mammogram for malignant neoplasm of breast: Secondary | ICD-10-CM | POA: Diagnosis not present

## 2023-02-11 DIAGNOSIS — J449 Chronic obstructive pulmonary disease, unspecified: Secondary | ICD-10-CM | POA: Diagnosis not present

## 2023-02-11 DIAGNOSIS — K3184 Gastroparesis: Secondary | ICD-10-CM | POA: Diagnosis not present

## 2023-02-11 DIAGNOSIS — R569 Unspecified convulsions: Secondary | ICD-10-CM | POA: Diagnosis not present

## 2023-02-11 DIAGNOSIS — G250 Essential tremor: Secondary | ICD-10-CM | POA: Diagnosis not present

## 2023-02-11 DIAGNOSIS — N1831 Chronic kidney disease, stage 3a: Secondary | ICD-10-CM | POA: Diagnosis not present

## 2023-02-11 DIAGNOSIS — E119 Type 2 diabetes mellitus without complications: Secondary | ICD-10-CM | POA: Diagnosis not present

## 2023-02-11 DIAGNOSIS — E782 Mixed hyperlipidemia: Secondary | ICD-10-CM | POA: Diagnosis not present

## 2023-02-11 DIAGNOSIS — E039 Hypothyroidism, unspecified: Secondary | ICD-10-CM | POA: Diagnosis not present

## 2023-02-11 DIAGNOSIS — I10 Essential (primary) hypertension: Secondary | ICD-10-CM | POA: Diagnosis not present

## 2023-02-11 DIAGNOSIS — I4891 Unspecified atrial fibrillation: Secondary | ICD-10-CM | POA: Diagnosis not present

## 2023-03-06 DIAGNOSIS — Z79891 Long term (current) use of opiate analgesic: Secondary | ICD-10-CM | POA: Diagnosis not present

## 2023-03-06 DIAGNOSIS — M25559 Pain in unspecified hip: Secondary | ICD-10-CM | POA: Diagnosis not present

## 2023-03-06 DIAGNOSIS — G894 Chronic pain syndrome: Secondary | ICD-10-CM | POA: Diagnosis not present

## 2023-03-06 DIAGNOSIS — Z79899 Other long term (current) drug therapy: Secondary | ICD-10-CM | POA: Diagnosis not present

## 2023-03-06 DIAGNOSIS — M961 Postlaminectomy syndrome, not elsewhere classified: Secondary | ICD-10-CM | POA: Diagnosis not present

## 2023-03-13 DIAGNOSIS — R42 Dizziness and giddiness: Secondary | ICD-10-CM | POA: Diagnosis not present

## 2023-03-13 DIAGNOSIS — R110 Nausea: Secondary | ICD-10-CM | POA: Diagnosis not present

## 2023-03-16 ENCOUNTER — Emergency Department
Admission: EM | Admit: 2023-03-16 | Discharge: 2023-03-16 | Disposition: A | Discharge status: Home / Self Care | Payer: PPO | Attending: Emergency Medicine | Admitting: Emergency Medicine

## 2023-03-16 ENCOUNTER — Other Ambulatory Visit: Payer: Self-pay

## 2023-03-16 ENCOUNTER — Emergency Department: Payer: PPO

## 2023-03-16 DIAGNOSIS — A09 Infectious gastroenteritis and colitis, unspecified: Secondary | ICD-10-CM | POA: Insufficient documentation

## 2023-03-16 DIAGNOSIS — J449 Chronic obstructive pulmonary disease, unspecified: Secondary | ICD-10-CM | POA: Diagnosis not present

## 2023-03-16 DIAGNOSIS — E119 Type 2 diabetes mellitus without complications: Secondary | ICD-10-CM | POA: Insufficient documentation

## 2023-03-16 DIAGNOSIS — K5730 Diverticulosis of large intestine without perforation or abscess without bleeding: Secondary | ICD-10-CM | POA: Diagnosis not present

## 2023-03-16 DIAGNOSIS — Z7982 Long term (current) use of aspirin: Secondary | ICD-10-CM | POA: Insufficient documentation

## 2023-03-16 DIAGNOSIS — E871 Hypo-osmolality and hyponatremia: Secondary | ICD-10-CM | POA: Insufficient documentation

## 2023-03-16 DIAGNOSIS — R197 Diarrhea, unspecified: Secondary | ICD-10-CM | POA: Diagnosis not present

## 2023-03-16 DIAGNOSIS — R109 Unspecified abdominal pain: Secondary | ICD-10-CM | POA: Insufficient documentation

## 2023-03-16 DIAGNOSIS — Z79899 Other long term (current) drug therapy: Secondary | ICD-10-CM | POA: Insufficient documentation

## 2023-03-16 DIAGNOSIS — Z7984 Long term (current) use of oral hypoglycemic drugs: Secondary | ICD-10-CM | POA: Diagnosis not present

## 2023-03-16 DIAGNOSIS — I10 Essential (primary) hypertension: Secondary | ICD-10-CM | POA: Insufficient documentation

## 2023-03-16 LAB — COMPREHENSIVE METABOLIC PANEL
ALT: 39 U/L (ref 0–44)
AST: 30 U/L (ref 15–41)
Albumin: 4.4 g/dL (ref 3.5–5.0)
Alkaline Phosphatase: 61 U/L (ref 38–126)
Anion gap: 10 (ref 5–15)
BUN: 22 mg/dL (ref 8–23)
CO2: 23 mmol/L (ref 22–32)
Calcium: 9.5 mg/dL (ref 8.9–10.3)
Chloride: 94 mmol/L — ABNORMAL LOW (ref 98–111)
Creatinine, Ser: 0.82 mg/dL (ref 0.44–1.00)
GFR, Estimated: >60 mL/min (ref >60)
Glucose, Bld: 142 mg/dL — ABNORMAL HIGH (ref 70–99)
Potassium: 4.3 mmol/L (ref 3.5–5.1)
Sodium: 127 mmol/L — ABNORMAL LOW (ref 135–145)
Total Bilirubin: 0.7 mg/dL (ref 0.3–1.2)
Total Protein: 7.8 g/dL (ref 6.5–8.1)

## 2023-03-16 LAB — CBC
HCT: 38.2 % (ref 36.0–46.0)
Hemoglobin: 13.1 g/dL (ref 12.0–15.0)
MCH: 29.6 pg (ref 26.0–34.0)
MCHC: 34.3 g/dL (ref 30.0–36.0)
MCV: 86.2 fL (ref 80.0–100.0)
Platelets: 398 10*3/uL (ref 150–400)
RBC: 4.43 MIL/uL (ref 3.87–5.11)
RDW: 11.9 % (ref 11.5–15.5)
WBC: 11.1 10*3/uL — ABNORMAL HIGH (ref 4.0–10.5)
nRBC: 0 % (ref 0.0–0.2)

## 2023-03-16 LAB — MAGNESIUM: Magnesium: 1.4 mg/dL — ABNORMAL LOW (ref 1.7–2.4)

## 2023-03-16 LAB — URINALYSIS, ROUTINE W REFLEX MICROSCOPIC
Bilirubin Urine: NEGATIVE
Glucose, UA: NEGATIVE mg/dL
Hgb urine dipstick: NEGATIVE
Ketones, ur: NEGATIVE mg/dL
Leukocytes,Ua: NEGATIVE
Nitrite: NEGATIVE
Protein, ur: NEGATIVE mg/dL
Specific Gravity, Urine: 1.023 (ref 1.005–1.030)
pH: 6 (ref 5.0–8.0)

## 2023-03-16 LAB — LIPASE, BLOOD: Lipase: 67 U/L — ABNORMAL HIGH (ref 11–51)

## 2023-03-16 MED ORDER — SODIUM CHLORIDE 0.9 % IV BOLUS (SEPSIS)
500.0000 mL | Freq: Once | INTRAVENOUS | Status: AC
  Administered 2023-03-16: 500 mL via INTRAVENOUS

## 2023-03-16 MED ORDER — MAGNESIUM SULFATE 2 GM/50ML IV SOLN
2.0000 g | Freq: Once | INTRAVENOUS | Status: AC
  Administered 2023-03-16: 2 g via INTRAVENOUS
  Filled 2023-03-16: qty 50

## 2023-03-16 MED ORDER — ONDANSETRON HCL 4 MG/2ML IJ SOLN
4.0000 mg | Freq: Once | INTRAMUSCULAR | Status: AC
  Administered 2023-03-16: 4 mg via INTRAVENOUS
  Filled 2023-03-16 (×2): qty 2

## 2023-03-16 MED ORDER — IOHEXOL 300 MG/ML  SOLN
100.0000 mL | Freq: Once | INTRAMUSCULAR | Status: AC | PRN
  Administered 2023-03-16: 100 mL via INTRAVENOUS

## 2023-03-16 MED ORDER — ONDANSETRON 4 MG PO TBDP: 4.0000 mg | ORAL_TABLET | Freq: Four times a day (QID) | ORAL | 0 refills | Status: DC | PRN

## 2023-03-16 NOTE — ED Notes (Signed)
ED Provider at bedside. 

## 2023-03-16 NOTE — ED Provider Notes (Signed)
[facility] Provider Note    Event Date/Time   First MD Initiated Contact with Patient 03/16/23 0411     (approximate)   History   Nausea   HPI  Nicole Pittman is a 72 y.o. female with history of COPD, diabetes, hypertension, hyperlipidemia, A-fib not on anticoagulation, chronic pain on chronic opiates, gastroparesis who presented to the emergency department with 4 days of nausea and diarrhea.  No bloody stools, melena, vomiting, fever.  No sick contact, recent travel, antibiotic use or hospitalization.  She denies having abdominal pain.  States she has taken Imodium intermittently which has helped with diarrhea but she states she is just concerned about "taking too much".  She reports she has had previous cholecystectomy and ovarian cystectomy.  She reports she started having leg cramps.  Patient is hypertensive here and reports she is taking her medications.  On recheck this has improved.  No headaches, chest pain, shortness of breath, neurologic deficits.  History provided by patient.    Past Medical History:  Diagnosis Date   Adhesive arachnoiditis    Arthritis    Back problem    spurs   COPD (chronic obstructive pulmonary disease) (HCC)    Diabetes (HCC)    High cholesterol    Hypertension    Neck problem    bad disc    Past Surgical History:  Procedure Laterality Date   BACK SURGERY  1978   L5   CHOLECYSTECTOMY  1992   KNEE SURGERY Bilateral 1993   OVARIAN CYST REMOVAL  1985   TONSILLECTOMY  1959   WRIST SURGERY Bilateral 2003    MEDICATIONS:  Prior to Admission medications   Medication Sig Start Date End Date Taking? Authorizing Provider  amitriptyline (ELAVIL) 25 MG tablet Take 25 mg by mouth at bedtime.    [provider]  amLODipine (NORVASC) 10 MG tablet TAKE 1 TABLET BY MOUTH EVERY DAY 08/13/21   Hilty, Lisette Abu, MD  aspirin 81 MG tablet Take 81 mg by mouth daily with supper.     [provider]   atorvastatin (LIPITOR) 40 MG tablet Take 40 mg by mouth at bedtime.     [provider]  baclofen (LIORESAL) 10 MG tablet Take 10 mg by mouth 2 (two) times daily.  01/01/16   [provider]  Cholecalciferol (VITAMIN D-3 PO) Take 1 capsule by mouth daily.    [provider]  EASY COMFORT LANCETS MISC 4 (four) times daily. for testing 12/14/15   [provider]  fenofibrate (TRICOR) 48 MG tablet Take 48 mg by mouth at bedtime.     [provider]  FREESTYLE LITE test strip 4 (four) times daily. for testing 12/14/15   [provider]  glipiZIDE-metformin (METAGLIP) 5-500 MG tablet Take 1 tablet by mouth 2 (two) times daily.  12/25/15   [provider]  hydrOXYzine (ATARAX/VISTARIL) 25 MG tablet Take 25 mg by mouth daily. 08/23/20   [provider]  levETIRAcetam (KEPPRA) 500 MG tablet Take 500 mg by mouth daily.    [provider]  loperamide (IMODIUM) 2 MG capsule Take 2 capsules (4 mg total) by mouth every 6 (six) hours. For diarrhea 08/07/19   Mayo, Allyn Kenner, MD  metoCLOPramide (REGLAN) 10 MG tablet Take 10 mg by mouth 4 (four) times daily as needed for nausea or vomiting.     [provider]  metoprolol tartrate (LOPRESSOR) 100 MG tablet Take 100 mg by mouth 2 (two) times  daily. 11/08/19   [provider]  nitroGLYCERIN (NITROSTAT) 0.4 MG SL tablet Place 1 tablet (0.4 mg total) under the tongue every 5 (five) minutes as needed for chest pain. MAX 3 doses in 15 minutes 10/17/20 01/15/21  Azalee Course, PA  ondansetron (ZOFRAN) 4 MG tablet Take 1 tablet (4 mg total) by mouth every 6 (six) hours as needed for nausea. 08/07/19   Mayo, Allyn Kenner, MD  oxyCODONE (OXY IR/ROXICODONE) 5 MG immediate release tablet Take 5 mg by mouth 2 (two) times daily.  01/02/16   [provider]  OXYCONTIN 20 MG 12 hr tablet Take 10 mg by mouth in the morning and at bedtime.  12/19/15   [provider]  PROAIR HFA 108  (90 Base) MCG/ACT inhaler Inhale 2 puffs into the lungs 2 (two) times daily as needed for wheezing or shortness of breath.  Patient not taking: Reported on 10/17/2020 01/03/16   [provider]    Physical Exam   Triage Vital Signs: ED Triage Vitals [03/16/23 0138]  Enc Vitals Group     BP (!) 212/75     Pulse Rate (!) 56     Resp 18     Temp 98.5 F (36.9 C)     Temp Source Oral     SpO2 98 %     Weight      Height      Head Circumference      Peak Flow      Pain Score      Pain Loc      Pain Edu?      Excl. in GC?     Most recent vital signs: Vitals:   03/16/23 0445 03/16/23 0555  BP: (!) 184/78 (!) 165/88  Pulse: 63 64  Resp: 18 18  Temp:    SpO2: 96% 97%    CONSTITUTIONAL: Alert, responds appropriately to questions. Well-appearing; well-nourished, elderly, afebrile, nontoxic HEAD: Normocephalic, atraumatic EYES: Conjunctivae clear, pupils appear equal, sclera nonicteric ENT: normal nose; moist mucous membranes NECK: Supple, normal ROM CARD: RRR; S1 and S2 appreciated RESP: Normal chest excursion without splinting or tachypnea; breath sounds clear and equal bilaterally; no wheezes, no rhonchi, no rales, no hypoxia or respiratory distress, speaking full sentences ABD/GI: Non-distended; soft, non-tender, no rebound, no guarding, no peritoneal signs BACK: The back appears normal EXT: Normal ROM in all joints; no deformity noted, no edema SKIN: Normal color for age and race; warm; no rash on exposed skin NEURO: Moves all extremities equally, normal speech PSYCH: The patient's mood and manner are appropriate.   ED Results / Procedures / Treatments   LABS: (all labs ordered are listed, but only abnormal results are displayed) Labs Reviewed  LIPASE, BLOOD - Abnormal; Notable for the following components:      Result Value   Lipase 67 (*)    All other components within normal limits  COMPREHENSIVE METABOLIC PANEL - Abnormal; Notable for the following  components:   Sodium 127 (*)    Chloride 94 (*)    Glucose, Bld 142 (*)    All other components within normal limits  CBC - Abnormal; Notable for the following components:   WBC 11.1 (*)    All other components within normal limits  URINALYSIS, ROUTINE W REFLEX MICROSCOPIC - Abnormal; Notable for the following components:   Color, Urine STRAW (*)    APPearance CLEAR (*)    All other components within normal limits  MAGNESIUM - Abnormal; Notable for the following  components:   Magnesium 1.4 (*)    All other components within normal limits     EKG:  EKG Interpretation  Date/Time:  Sunday March 16 2023 04:49:59 EDT Ventricular Rate:  66 PR Interval:    QRS Duration: 123 QT Interval:  450 QTC Calculation: 472 R Axis:   5 Text Interpretation: Atrial fibrillation versus artifact Nonspecific intraventricular conduction delay Borderline ST depression, diffuse leads Confirmed by Rochele Raring [id number]) on 03/16/2023 9:49:17 AM         RADIOLOGY: My personal review and interpretation of imaging: CT shows gastroenteritis.  I have personally reviewed all radiology reports.   CT ABDOMEN PELVIS W CONTRAST  Result Date: 03/16/2023 CLINICAL DATA:  Abdominal pain, nausea and diarrhea onset 4 days ago. EXAM: CT ABDOMEN AND PELVIS WITH CONTRAST TECHNIQUE: Multidetector CT imaging of the abdomen and pelvis was performed using the standard protocol following bolus administration of intravenous contrast. RADIATION DOSE REDUCTION: This exam was performed according to the departmental dose-optimization program which includes automated exposure control, adjustment of the mA and/or kV according to patient size and/or use of iterative reconstruction technique. CONTRAST:  OMNIPAQUE IOHEXOL 300 MG/ML  SOLN COMPARISON:  CT with IV contrast 08/02/2019 FINDINGS: Lower chest: No significant findings. Hepatobiliary: The liver 17 cm length and moderately steatotic as before. There is no mass enhancement.  Gallbladder is absent with stable post cholecystectomy common bile duct prominence measuring 9 mm. Pancreas: No abnormality. Spleen: No abnormality.  No splenomegaly. Adrenals/Urinary Tract: Slight chronic nodular thickening both adrenal glands. No focal renal abnormality. Both kidneys excrete symmetrically in the delayed phase. There is no urinary stone or obstruction. No bladder thickening. Stomach/Bowel: There are chronic thickened folds in the stomach and jejunum. No mesenteric inflammatory reaction is seen. An appendix is not seen in this patient. Rest of the small bowel is unremarkable. The large bowel demonstrates scattered diverticulosis. No acute inflammatory changes are seen. Vascular/Lymphatic: There is heavy aortoiliac calcific disease. Branch vessel atherosclerosis including the proximal renal arteries. No adenopathy is seen. Reproductive: Uterus and bilateral adnexa are unremarkable. Other: No abdominal wall hernia or abnormality. No abdominopelvic ascites. Musculoskeletal: There is osteopenia and degenerative changes of the spine. Advanced L3-4 facet hypertrophy is again noted, grade 1 degenerative anterolisthesis and moderate to severe acquired L3-4 spinal stenosis. No acute or new osseous findings. IMPRESSION: 1. No acute findings in the abdomen or pelvis. 2. Chronic thickened folds in the stomach and jejunum, compatible with chronic gastroenteritis. 3. Diverticulosis without evidence of diverticulitis. 4. Aortic and branch vessel atherosclerosis. 5. Moderately steatotic liver. 6. Osteopenia and degenerative change. 7. Moderate to severe acquired L3-4 spinal stenosis. Aortic Atherosclerosis (ICD10-I70.0). Electronically Signed   By: Almira Bar M.D.   On: 03/16/2023 05:02     PROCEDURES:  Critical Care performed: No     .1-3 Lead EKG Interpretation  Performed by: Asher Babilonia, Layla Maw, DO Authorized by: Earlie Arciga, Layla Maw, DO     Interpretation: normal     ECG rate:  64   ECG rate  assessment: normal     Rhythm: sinus rhythm     Ectopy: none     Conduction: normal       IMPRESSION / MDM / ASSESSMENT AND PLAN / ED COURSE  I reviewed the triage vital signs and the nursing notes.    Patient here with nausea and diarrhea for 4 days.  The patient is on the cardiac monitor to evaluate for evidence of arrhythmia and/or significant heart rate changes.  DIFFERENTIAL DIAGNOSIS (includes but not limited to):   Viral gastroenteritis, infectious diarrhea, doubt C. difficile, electrolyte derangement, dehydration, colitis, diverticulitis   Patient's presentation is most consistent with acute presentation with potential threat to life or bodily function.   PLAN: Workup initiated from triage.  No leukocytosis, normal potassium.  Sodium slightly low at 127.  She is getting IV fluids.  Magnesium is low at 1.4 which could be the cause of her leg cramps and likely due to diarrhea.  Will give IV replacement.  Normal creatinine, LFTs and lipase.  CT of the abdomen pelvis reviewed and interpreted by myself and the radiologist and shows gastroenteritis.  Urine pending.  Patient tolerating p.o.   MEDICATIONS GIVEN IN ED: Medications  ondansetron (ZOFRAN) injection 4 mg (4 mg Intravenous Given 03/16/23 0437)  iohexol (OMNIPAQUE) 300 MG/ML solution 100 mL (100 mLs Intravenous Contrast Given 03/16/23 0412)  magnesium sulfate IVPB 2 g 50 mL (0 g Intravenous Stopped 03/16/23 0537)  sodium chloride 0.9 % bolus 500 mL (0 mLs Intravenous Stopped 03/16/23 0530)     ED COURSE: EKG obtained secondary to hypomagnesemia.  No interval changes.  Significant artifact versus A-fib on EKG however patient appears to be in sinus rhythm on the cardiac monitor with a regular heart rate.  Patient does have a documented history of A-fib.  She was hypertensive on arrival but this appears to be improving.  She is not having any symptoms from hypertension.  Her urine today shows no sign of infection,  proteinuria or hematuria.  Also no ketones.  I feel she is safe to be discharged.  Discussed with patient that this is likely a viral illness and discussed supportive care instructions.  Discussed with her that it is safe to use Imodium over-the-counter as directed on packaging and as needed.  Recommended a bland diet.  Will discharge with Zofran.  At this time, I do not feel there is any life-threatening condition present. I reviewed all nursing notes, vitals, pertinent previous records.  All lab and urine results, EKGs, imaging ordered have been independently reviewed and interpreted by myself.  I reviewed all available radiology reports from any imaging ordered this visit.  Based on my assessment, I feel the patient is safe to be discharged home without further emergent workup and can continue workup as an outpatient as needed. Discussed all findings, treatment plan as well as usual and customary return precautions.  They verbalize understanding and are comfortable with this plan.  Outpatient follow-up has been provided as needed.  All questions have been answered.    CONSULTS:  none   OUTSIDE RECORDS REVIEWED: Reviewed prior PCP notes.       FINAL CLINICAL IMPRESSION(S) / ED DIAGNOSES   Final diagnoses:  Infectious diarrhea  Hypomagnesemia  Hyponatremia     Rx / DC Orders   ED Discharge Orders          Ordered    ondansetron (ZOFRAN-ODT) 4 MG disintegrating tablet  Every 6 hours PRN        03/16/23 0544             Note:  This document was prepared using Dragon voice recognition software and may include unintentional dictation errors.   Erby Sanderson, Layla Maw, DO 03/16/23 [phone]

## 2023-03-16 NOTE — ED Notes (Signed)
[phone] Husband Reuel Boom

## 2023-03-16 NOTE — Discharge Instructions (Addendum)
You may take Tylenol 1000 mg every 6 hours as needed for pain, fever.  You may take over-the-counter Imodium as needed for diarrhea.  I recommend a bland diet for the next several days and increase fluid intake.  Your CT scan today showed signs of gastroenteritis.  No other acute abnormality.  Your sodium level was slightly low at 127 and your magnesium was low at 1.4.  You have received IV fluids and IV magnesium.  I recommend you follow-up with your PCP to have these labs rechecked in 1 to 2 weeks.

## 2023-03-16 NOTE — ED Triage Notes (Addendum)
Patient C/O nausea, diarrhea and leg cramps that started four days ago. Denies any fevers, vomiting, or other symptoms. No recent antibiotic use. Drinking normally, decreased appetite. Patient is notably hypertensive in triage, does have history of such and states she did take her BP medication today

## 2023-03-16 NOTE — ED Notes (Signed)
PT will be brought to ed rm 19 but was currently taken to CT, this RN now assuming care.

## 2023-03-18 DIAGNOSIS — K3184 Gastroparesis: Secondary | ICD-10-CM | POA: Diagnosis not present

## 2023-03-18 DIAGNOSIS — E782 Mixed hyperlipidemia: Secondary | ICD-10-CM | POA: Diagnosis not present

## 2023-03-18 DIAGNOSIS — R197 Diarrhea, unspecified: Secondary | ICD-10-CM | POA: Diagnosis not present

## 2023-03-18 DIAGNOSIS — N1831 Chronic kidney disease, stage 3a: Secondary | ICD-10-CM | POA: Diagnosis not present

## 2023-03-18 DIAGNOSIS — I4891 Unspecified atrial fibrillation: Secondary | ICD-10-CM | POA: Diagnosis not present

## 2023-03-18 DIAGNOSIS — I10 Essential (primary) hypertension: Secondary | ICD-10-CM | POA: Diagnosis not present

## 2023-03-18 DIAGNOSIS — J449 Chronic obstructive pulmonary disease, unspecified: Secondary | ICD-10-CM | POA: Diagnosis not present

## 2023-03-18 DIAGNOSIS — E119 Type 2 diabetes mellitus without complications: Secondary | ICD-10-CM | POA: Diagnosis not present

## 2023-03-18 DIAGNOSIS — G250 Essential tremor: Secondary | ICD-10-CM | POA: Diagnosis not present

## 2023-03-18 DIAGNOSIS — R569 Unspecified convulsions: Secondary | ICD-10-CM | POA: Diagnosis not present

## 2023-03-18 DIAGNOSIS — E039 Hypothyroidism, unspecified: Secondary | ICD-10-CM | POA: Diagnosis not present

## 2023-03-20 ENCOUNTER — Telehealth: Payer: Self-pay

## 2023-03-20 NOTE — Telephone Encounter (Signed)
        Patient  visited [facility] on 03/16/2023  for nausea.   Telephone encounter attempt :  1st  A HIPAA compliant voice message was left requesting a return call.  Instructed patient to call back at [phone].   [name] [name]  [facility] Population Health Community Resource Care Guide   [email or web address]  ?? [phone]   Website: [email or web address]  [email or web address]

## 2023-03-21 ENCOUNTER — Telehealth: Payer: Self-pay

## 2023-03-21 NOTE — Telephone Encounter (Signed)
        Patient  visited [facility] on 03/16/2023  for nausea, infectious diarrhea.   Telephone encounter attempt : 2nd   A HIPAA compliant voice message was left requesting a return call.  Instructed patient to call back at [phone].   Nicole Pittman [name]  [facility] Population Health Community Resource Care Guide   [email or web address]  ?? [phone]   Website: [email or web address]  [email or web address]

## 2023-03-25 DIAGNOSIS — F339 Major depressive disorder, recurrent, unspecified: Secondary | ICD-10-CM | POA: Diagnosis not present

## 2023-03-25 DIAGNOSIS — E782 Mixed hyperlipidemia: Secondary | ICD-10-CM | POA: Diagnosis not present

## 2023-03-25 DIAGNOSIS — I119 Hypertensive heart disease without heart failure: Secondary | ICD-10-CM | POA: Diagnosis not present

## 2023-05-08 DIAGNOSIS — M961 Postlaminectomy syndrome, not elsewhere classified: Secondary | ICD-10-CM | POA: Diagnosis not present

## 2023-05-08 DIAGNOSIS — G894 Chronic pain syndrome: Secondary | ICD-10-CM | POA: Diagnosis not present

## 2023-05-08 DIAGNOSIS — Z79899 Other long term (current) drug therapy: Secondary | ICD-10-CM | POA: Diagnosis not present

## 2023-05-08 DIAGNOSIS — M25559 Pain in unspecified hip: Secondary | ICD-10-CM | POA: Diagnosis not present

## 2023-05-08 DIAGNOSIS — Z79891 Long term (current) use of opiate analgesic: Secondary | ICD-10-CM | POA: Diagnosis not present

## 2023-05-15 DIAGNOSIS — E119 Type 2 diabetes mellitus without complications: Secondary | ICD-10-CM | POA: Diagnosis not present

## 2023-05-15 DIAGNOSIS — E039 Hypothyroidism, unspecified: Secondary | ICD-10-CM | POA: Diagnosis not present

## 2023-05-15 DIAGNOSIS — I10 Essential (primary) hypertension: Secondary | ICD-10-CM | POA: Diagnosis not present

## 2023-05-15 DIAGNOSIS — I4891 Unspecified atrial fibrillation: Secondary | ICD-10-CM | POA: Diagnosis not present

## 2023-05-15 DIAGNOSIS — J449 Chronic obstructive pulmonary disease, unspecified: Secondary | ICD-10-CM | POA: Diagnosis not present

## 2023-05-15 DIAGNOSIS — G250 Essential tremor: Secondary | ICD-10-CM | POA: Diagnosis not present

## 2023-05-15 DIAGNOSIS — N1831 Chronic kidney disease, stage 3a: Secondary | ICD-10-CM | POA: Diagnosis not present

## 2023-05-15 DIAGNOSIS — R569 Unspecified convulsions: Secondary | ICD-10-CM | POA: Diagnosis not present

## 2023-05-15 DIAGNOSIS — K3184 Gastroparesis: Secondary | ICD-10-CM | POA: Diagnosis not present

## 2023-05-15 DIAGNOSIS — E782 Mixed hyperlipidemia: Secondary | ICD-10-CM | POA: Diagnosis not present

## 2023-07-03 DIAGNOSIS — G894 Chronic pain syndrome: Secondary | ICD-10-CM | POA: Diagnosis not present

## 2023-07-03 DIAGNOSIS — M25559 Pain in unspecified hip: Secondary | ICD-10-CM | POA: Diagnosis not present

## 2023-07-03 DIAGNOSIS — Z79891 Long term (current) use of opiate analgesic: Secondary | ICD-10-CM | POA: Diagnosis not present

## 2023-07-03 DIAGNOSIS — M961 Postlaminectomy syndrome, not elsewhere classified: Secondary | ICD-10-CM | POA: Diagnosis not present

## 2023-07-22 DIAGNOSIS — H40023 Open angle with borderline findings, high risk, bilateral: Secondary | ICD-10-CM | POA: Diagnosis not present

## 2023-07-22 DIAGNOSIS — H2513 Age-related nuclear cataract, bilateral: Secondary | ICD-10-CM | POA: Diagnosis not present

## 2023-07-22 DIAGNOSIS — H40053 Ocular hypertension, bilateral: Secondary | ICD-10-CM | POA: Diagnosis not present

## 2023-07-22 DIAGNOSIS — E119 Type 2 diabetes mellitus without complications: Secondary | ICD-10-CM | POA: Diagnosis not present

## 2023-07-22 DIAGNOSIS — H524 Presbyopia: Secondary | ICD-10-CM | POA: Diagnosis not present

## 2023-08-15 DIAGNOSIS — G250 Essential tremor: Secondary | ICD-10-CM | POA: Diagnosis not present

## 2023-08-15 DIAGNOSIS — E119 Type 2 diabetes mellitus without complications: Secondary | ICD-10-CM | POA: Diagnosis not present

## 2023-08-15 DIAGNOSIS — R569 Unspecified convulsions: Secondary | ICD-10-CM | POA: Diagnosis not present

## 2023-08-15 DIAGNOSIS — E039 Hypothyroidism, unspecified: Secondary | ICD-10-CM | POA: Diagnosis not present

## 2023-08-15 DIAGNOSIS — I10 Essential (primary) hypertension: Secondary | ICD-10-CM | POA: Diagnosis not present

## 2023-08-15 DIAGNOSIS — E782 Mixed hyperlipidemia: Secondary | ICD-10-CM | POA: Diagnosis not present

## 2023-08-15 DIAGNOSIS — I4891 Unspecified atrial fibrillation: Secondary | ICD-10-CM | POA: Diagnosis not present

## 2023-08-15 DIAGNOSIS — K3184 Gastroparesis: Secondary | ICD-10-CM | POA: Diagnosis not present

## 2023-08-15 DIAGNOSIS — J449 Chronic obstructive pulmonary disease, unspecified: Secondary | ICD-10-CM | POA: Diagnosis not present

## 2023-08-15 DIAGNOSIS — N1831 Chronic kidney disease, stage 3a: Secondary | ICD-10-CM | POA: Diagnosis not present

## 2023-08-28 DIAGNOSIS — G894 Chronic pain syndrome: Secondary | ICD-10-CM | POA: Diagnosis not present

## 2023-08-28 DIAGNOSIS — M25559 Pain in unspecified hip: Secondary | ICD-10-CM | POA: Diagnosis not present

## 2023-08-28 DIAGNOSIS — Z79891 Long term (current) use of opiate analgesic: Secondary | ICD-10-CM | POA: Diagnosis not present

## 2023-08-28 DIAGNOSIS — M961 Postlaminectomy syndrome, not elsewhere classified: Secondary | ICD-10-CM | POA: Diagnosis not present

## 2023-09-25 DIAGNOSIS — M961 Postlaminectomy syndrome, not elsewhere classified: Secondary | ICD-10-CM | POA: Diagnosis not present

## 2023-09-25 DIAGNOSIS — M25559 Pain in unspecified hip: Secondary | ICD-10-CM | POA: Diagnosis not present

## 2023-09-25 DIAGNOSIS — G894 Chronic pain syndrome: Secondary | ICD-10-CM | POA: Diagnosis not present

## 2023-10-30 DIAGNOSIS — Z79891 Long term (current) use of opiate analgesic: Secondary | ICD-10-CM | POA: Diagnosis not present

## 2023-10-30 DIAGNOSIS — G894 Chronic pain syndrome: Secondary | ICD-10-CM | POA: Diagnosis not present

## 2023-10-30 DIAGNOSIS — M25559 Pain in unspecified hip: Secondary | ICD-10-CM | POA: Diagnosis not present

## 2023-10-30 DIAGNOSIS — M961 Postlaminectomy syndrome, not elsewhere classified: Secondary | ICD-10-CM | POA: Diagnosis not present

## 2023-11-05 DIAGNOSIS — Z1212 Encounter for screening for malignant neoplasm of rectum: Secondary | ICD-10-CM | POA: Diagnosis not present

## 2023-11-05 DIAGNOSIS — Z1211 Encounter for screening for malignant neoplasm of colon: Secondary | ICD-10-CM | POA: Diagnosis not present

## 2023-11-14 DIAGNOSIS — G250 Essential tremor: Secondary | ICD-10-CM | POA: Diagnosis not present

## 2023-11-14 DIAGNOSIS — Z0001 Encounter for general adult medical examination with abnormal findings: Secondary | ICD-10-CM | POA: Diagnosis not present

## 2023-11-14 DIAGNOSIS — E039 Hypothyroidism, unspecified: Secondary | ICD-10-CM | POA: Diagnosis not present

## 2023-11-14 DIAGNOSIS — R569 Unspecified convulsions: Secondary | ICD-10-CM | POA: Diagnosis not present

## 2023-11-14 DIAGNOSIS — J449 Chronic obstructive pulmonary disease, unspecified: Secondary | ICD-10-CM | POA: Diagnosis not present

## 2023-11-14 DIAGNOSIS — N1831 Chronic kidney disease, stage 3a: Secondary | ICD-10-CM | POA: Diagnosis not present

## 2023-11-14 DIAGNOSIS — K3184 Gastroparesis: Secondary | ICD-10-CM | POA: Diagnosis not present

## 2023-11-14 DIAGNOSIS — I10 Essential (primary) hypertension: Secondary | ICD-10-CM | POA: Diagnosis not present

## 2023-11-14 DIAGNOSIS — E119 Type 2 diabetes mellitus without complications: Secondary | ICD-10-CM | POA: Diagnosis not present

## 2023-11-14 DIAGNOSIS — I4891 Unspecified atrial fibrillation: Secondary | ICD-10-CM | POA: Diagnosis not present

## 2023-11-14 DIAGNOSIS — E782 Mixed hyperlipidemia: Secondary | ICD-10-CM | POA: Diagnosis not present

## 2023-12-04 DIAGNOSIS — M961 Postlaminectomy syndrome, not elsewhere classified: Secondary | ICD-10-CM | POA: Diagnosis not present

## 2023-12-04 DIAGNOSIS — M25559 Pain in unspecified hip: Secondary | ICD-10-CM | POA: Diagnosis not present

## 2023-12-04 DIAGNOSIS — G894 Chronic pain syndrome: Secondary | ICD-10-CM | POA: Diagnosis not present

## 2024-01-01 DIAGNOSIS — R569 Unspecified convulsions: Secondary | ICD-10-CM | POA: Diagnosis not present

## 2024-01-01 DIAGNOSIS — E039 Hypothyroidism, unspecified: Secondary | ICD-10-CM | POA: Diagnosis not present

## 2024-01-01 DIAGNOSIS — K3184 Gastroparesis: Secondary | ICD-10-CM | POA: Diagnosis not present

## 2024-01-01 DIAGNOSIS — I4891 Unspecified atrial fibrillation: Secondary | ICD-10-CM | POA: Diagnosis not present

## 2024-01-01 DIAGNOSIS — G250 Essential tremor: Secondary | ICD-10-CM | POA: Diagnosis not present

## 2024-01-01 DIAGNOSIS — N1831 Chronic kidney disease, stage 3a: Secondary | ICD-10-CM | POA: Diagnosis not present

## 2024-01-01 DIAGNOSIS — E119 Type 2 diabetes mellitus without complications: Secondary | ICD-10-CM | POA: Diagnosis not present

## 2024-01-01 DIAGNOSIS — I10 Essential (primary) hypertension: Secondary | ICD-10-CM | POA: Diagnosis not present

## 2024-01-01 DIAGNOSIS — J449 Chronic obstructive pulmonary disease, unspecified: Secondary | ICD-10-CM | POA: Diagnosis not present

## 2024-01-01 DIAGNOSIS — E782 Mixed hyperlipidemia: Secondary | ICD-10-CM | POA: Diagnosis not present

## 2024-01-19 DIAGNOSIS — H401121 Primary open-angle glaucoma, left eye, mild stage: Secondary | ICD-10-CM | POA: Diagnosis not present

## 2024-01-19 DIAGNOSIS — H40053 Ocular hypertension, bilateral: Secondary | ICD-10-CM | POA: Diagnosis not present

## 2024-01-19 DIAGNOSIS — H401111 Primary open-angle glaucoma, right eye, mild stage: Secondary | ICD-10-CM | POA: Diagnosis not present

## 2024-01-29 DIAGNOSIS — Z79891 Long term (current) use of opiate analgesic: Secondary | ICD-10-CM | POA: Diagnosis not present

## 2024-01-29 DIAGNOSIS — G894 Chronic pain syndrome: Secondary | ICD-10-CM | POA: Diagnosis not present

## 2024-01-29 DIAGNOSIS — M961 Postlaminectomy syndrome, not elsewhere classified: Secondary | ICD-10-CM | POA: Diagnosis not present

## 2024-01-29 DIAGNOSIS — M25559 Pain in unspecified hip: Secondary | ICD-10-CM | POA: Diagnosis not present

## 2024-03-25 DIAGNOSIS — M25559 Pain in unspecified hip: Secondary | ICD-10-CM | POA: Diagnosis not present

## 2024-03-25 DIAGNOSIS — M549 Dorsalgia, unspecified: Secondary | ICD-10-CM | POA: Diagnosis not present

## 2024-03-25 DIAGNOSIS — G894 Chronic pain syndrome: Secondary | ICD-10-CM | POA: Diagnosis not present

## 2024-03-25 DIAGNOSIS — M961 Postlaminectomy syndrome, not elsewhere classified: Secondary | ICD-10-CM | POA: Diagnosis not present

## 2024-03-30 DIAGNOSIS — E782 Mixed hyperlipidemia: Secondary | ICD-10-CM | POA: Diagnosis not present

## 2024-03-30 DIAGNOSIS — E039 Hypothyroidism, unspecified: Secondary | ICD-10-CM | POA: Diagnosis not present

## 2024-03-30 DIAGNOSIS — E119 Type 2 diabetes mellitus without complications: Secondary | ICD-10-CM | POA: Diagnosis not present

## 2024-03-30 DIAGNOSIS — K3184 Gastroparesis: Secondary | ICD-10-CM | POA: Diagnosis not present

## 2024-03-30 DIAGNOSIS — I10 Essential (primary) hypertension: Secondary | ICD-10-CM | POA: Diagnosis not present

## 2024-03-30 DIAGNOSIS — R569 Unspecified convulsions: Secondary | ICD-10-CM | POA: Diagnosis not present

## 2024-03-30 DIAGNOSIS — J449 Chronic obstructive pulmonary disease, unspecified: Secondary | ICD-10-CM | POA: Diagnosis not present

## 2024-03-30 DIAGNOSIS — G250 Essential tremor: Secondary | ICD-10-CM | POA: Diagnosis not present

## 2024-03-30 DIAGNOSIS — N1831 Chronic kidney disease, stage 3a: Secondary | ICD-10-CM | POA: Diagnosis not present

## 2024-03-30 DIAGNOSIS — I4891 Unspecified atrial fibrillation: Secondary | ICD-10-CM | POA: Diagnosis not present

## 2024-05-11 DIAGNOSIS — H401131 Primary open-angle glaucoma, bilateral, mild stage: Secondary | ICD-10-CM | POA: Diagnosis not present

## 2024-05-20 DIAGNOSIS — G894 Chronic pain syndrome: Secondary | ICD-10-CM | POA: Diagnosis not present

## 2024-05-20 DIAGNOSIS — Z79891 Long term (current) use of opiate analgesic: Secondary | ICD-10-CM | POA: Diagnosis not present

## 2024-05-20 DIAGNOSIS — M25559 Pain in unspecified hip: Secondary | ICD-10-CM | POA: Diagnosis not present

## 2024-05-20 DIAGNOSIS — M961 Postlaminectomy syndrome, not elsewhere classified: Secondary | ICD-10-CM | POA: Diagnosis not present

## 2024-05-20 DIAGNOSIS — M549 Dorsalgia, unspecified: Secondary | ICD-10-CM | POA: Diagnosis not present

## 2024-06-30 DIAGNOSIS — G250 Essential tremor: Secondary | ICD-10-CM | POA: Diagnosis not present

## 2024-06-30 DIAGNOSIS — I4891 Unspecified atrial fibrillation: Secondary | ICD-10-CM | POA: Diagnosis not present

## 2024-06-30 DIAGNOSIS — R569 Unspecified convulsions: Secondary | ICD-10-CM | POA: Diagnosis not present

## 2024-06-30 DIAGNOSIS — I129 Hypertensive chronic kidney disease with stage 1 through stage 4 chronic kidney disease, or unspecified chronic kidney disease: Secondary | ICD-10-CM | POA: Diagnosis not present

## 2024-06-30 DIAGNOSIS — E039 Hypothyroidism, unspecified: Secondary | ICD-10-CM | POA: Diagnosis not present

## 2024-06-30 DIAGNOSIS — Z0001 Encounter for general adult medical examination with abnormal findings: Secondary | ICD-10-CM | POA: Diagnosis not present

## 2024-06-30 DIAGNOSIS — E1122 Type 2 diabetes mellitus with diabetic chronic kidney disease: Secondary | ICD-10-CM | POA: Diagnosis not present

## 2024-06-30 DIAGNOSIS — I10 Essential (primary) hypertension: Secondary | ICD-10-CM | POA: Diagnosis not present

## 2024-06-30 DIAGNOSIS — E782 Mixed hyperlipidemia: Secondary | ICD-10-CM | POA: Diagnosis not present

## 2024-06-30 DIAGNOSIS — K3184 Gastroparesis: Secondary | ICD-10-CM | POA: Diagnosis not present

## 2024-06-30 DIAGNOSIS — N1831 Chronic kidney disease, stage 3a: Secondary | ICD-10-CM | POA: Diagnosis not present

## 2024-06-30 DIAGNOSIS — J449 Chronic obstructive pulmonary disease, unspecified: Secondary | ICD-10-CM | POA: Diagnosis not present

## 2024-07-15 DIAGNOSIS — Z79891 Long term (current) use of opiate analgesic: Secondary | ICD-10-CM | POA: Diagnosis not present

## 2024-07-15 DIAGNOSIS — M961 Postlaminectomy syndrome, not elsewhere classified: Secondary | ICD-10-CM | POA: Diagnosis not present

## 2024-07-15 DIAGNOSIS — M549 Dorsalgia, unspecified: Secondary | ICD-10-CM | POA: Diagnosis not present

## 2024-07-15 DIAGNOSIS — Z79899 Other long term (current) drug therapy: Secondary | ICD-10-CM | POA: Diagnosis not present

## 2024-07-15 DIAGNOSIS — M25559 Pain in unspecified hip: Secondary | ICD-10-CM | POA: Diagnosis not present

## 2024-07-15 DIAGNOSIS — G894 Chronic pain syndrome: Secondary | ICD-10-CM | POA: Diagnosis not present

## 2024-09-09 DIAGNOSIS — G894 Chronic pain syndrome: Secondary | ICD-10-CM | POA: Diagnosis not present

## 2024-09-09 DIAGNOSIS — Z79899 Other long term (current) drug therapy: Secondary | ICD-10-CM | POA: Diagnosis not present

## 2024-09-09 DIAGNOSIS — M961 Postlaminectomy syndrome, not elsewhere classified: Secondary | ICD-10-CM | POA: Diagnosis not present

## 2024-09-09 DIAGNOSIS — Z79891 Long term (current) use of opiate analgesic: Secondary | ICD-10-CM | POA: Diagnosis not present

## 2024-09-09 DIAGNOSIS — M25559 Pain in unspecified hip: Secondary | ICD-10-CM | POA: Diagnosis not present

## 2024-09-09 DIAGNOSIS — M549 Dorsalgia, unspecified: Secondary | ICD-10-CM | POA: Diagnosis not present

## 2024-11-03 ENCOUNTER — Other Ambulatory Visit: Payer: Self-pay

## 2024-11-03 ENCOUNTER — Observation Stay
Admission: EM | Admit: 2024-11-03 | Discharge: 2024-11-05 | Disposition: A | Discharge status: Home / Self Care | Attending: Emergency Medicine | Admitting: Emergency Medicine

## 2024-11-03 ENCOUNTER — Emergency Department

## 2024-11-03 DIAGNOSIS — Z7982 Long term (current) use of aspirin: Secondary | ICD-10-CM | POA: Insufficient documentation

## 2024-11-03 DIAGNOSIS — J449 Chronic obstructive pulmonary disease, unspecified: Secondary | ICD-10-CM | POA: Insufficient documentation

## 2024-11-03 DIAGNOSIS — I10 Essential (primary) hypertension: Secondary | ICD-10-CM | POA: Insufficient documentation

## 2024-11-03 DIAGNOSIS — Z79899 Other long term (current) drug therapy: Secondary | ICD-10-CM | POA: Insufficient documentation

## 2024-11-03 DIAGNOSIS — R251 Tremor, unspecified: Secondary | ICD-10-CM | POA: Diagnosis present

## 2024-11-03 DIAGNOSIS — E1143 Type 2 diabetes mellitus with diabetic autonomic (poly)neuropathy: Secondary | ICD-10-CM | POA: Insufficient documentation

## 2024-11-03 DIAGNOSIS — Z794 Long term (current) use of insulin: Secondary | ICD-10-CM | POA: Insufficient documentation

## 2024-11-03 DIAGNOSIS — I672 Cerebral atherosclerosis: Secondary | ICD-10-CM | POA: Diagnosis not present

## 2024-11-03 DIAGNOSIS — G8929 Other chronic pain: Secondary | ICD-10-CM | POA: Insufficient documentation

## 2024-11-03 DIAGNOSIS — R4182 Altered mental status, unspecified: Secondary | ICD-10-CM | POA: Diagnosis not present

## 2024-11-03 DIAGNOSIS — M545 Low back pain, unspecified: Secondary | ICD-10-CM | POA: Insufficient documentation

## 2024-11-03 DIAGNOSIS — G9341 Metabolic encephalopathy: Principal | ICD-10-CM | POA: Insufficient documentation

## 2024-11-03 DIAGNOSIS — I6782 Cerebral ischemia: Secondary | ICD-10-CM | POA: Diagnosis not present

## 2024-11-03 DIAGNOSIS — R410 Disorientation, unspecified: Secondary | ICD-10-CM | POA: Diagnosis not present

## 2024-11-03 LAB — COMPREHENSIVE METABOLIC PANEL WITH GFR
ALT: 21 U/L (ref 0–44)
AST: 33 U/L (ref 15–41)
Albumin: 4 g/dL (ref 3.5–5.0)
Alkaline Phosphatase: 65 U/L (ref 38–126)
Anion gap: 19 — ABNORMAL HIGH (ref 5–15)
BUN: 19 mg/dL (ref 8–23)
CO2: 19 mmol/L — ABNORMAL LOW (ref 22–32)
Calcium: 8.5 mg/dL — ABNORMAL LOW (ref 8.9–10.3)
Chloride: 101 mmol/L (ref 98–111)
Creatinine, Ser: 0.98 mg/dL (ref 0.44–1.00)
GFR, Estimated: >60 mL/min (ref >60)
Glucose, Bld: 120 mg/dL — ABNORMAL HIGH (ref 70–99)
Potassium: 3.6 mmol/L (ref 3.5–5.1)
Sodium: 139 mmol/L (ref 135–145)
Total Bilirubin: 0.3 mg/dL (ref 0.0–1.2)
Total Protein: 6.4 g/dL — ABNORMAL LOW (ref 6.5–8.1)

## 2024-11-03 LAB — CBC
HCT: 36.1 % (ref 36.0–46.0)
Hemoglobin: 11.8 g/dL — ABNORMAL LOW (ref 12.0–15.0)
MCH: 29.1 pg (ref 26.0–34.0)
MCHC: 32.7 g/dL (ref 30.0–36.0)
MCV: 89.1 fL (ref 80.0–100.0)
Platelets: 336 K/uL (ref 150–400)
RBC: 4.05 MIL/uL (ref 3.87–5.11)
RDW: 12.4 % (ref 11.5–15.5)
WBC: 10.1 K/uL (ref 4.0–10.5)
nRBC: 0 % (ref 0.0–0.2)

## 2024-11-03 NOTE — ED Triage Notes (Signed)
 Per EMS:  Increased AMS Started about 3 weeks ago Could be related to oxycodone  use Hx of taking oxycodone  and increased AMS Pt has not been taking meds per family Medications seen all over the place by family Decline in ADL's Unable to take care of herself Leaves with husband and son who is autistic

## 2024-11-03 NOTE — ED Provider Notes (Signed)
 [facility] Provider Note    Event Date/Time   First MD Initiated Contact with Patient 11/03/24 1845     (approximate)   History   Altered Mental Status   HPI  Nicole Pittman is a 73 y.o. female who presents with concern of altered mental status.  The patient apparently over the last 2 weeks has had a gradual development of worsening mentation.  Increased frequency of confusion, and also now developing increased falls and difficulty with ambulation.  She is at baseline independent able to care for herself and assistant caring for her autistic son.  Apparently a few weeks ago she ran out of her chronic oxycodone , eventually this was restarted and after he was restarted her husband noticed that she was starting to act more strangely.  Getting confused during conversation, at times having trouble with word finding, wandering the halls and forgetting where she is.  Seems to be distracted development over the last 2 weeks.  No headaches fevers chills sick contacts denying chest pain shortness of breath abdominal pain urinary symptoms.      Physical Exam   Triage Vital Signs: ED Triage Vitals  Encounter Vitals Group     BP      Girls Systolic BP Percentile      Girls Diastolic BP Percentile      Boys Systolic BP Percentile      Boys Diastolic BP Percentile      Pulse      Resp      Temp      Temp src      SpO2      Weight      Height      Head Circumference      Peak Flow      Pain Score      Pain Loc      Pain Education      Exclude from Growth Chart     Most recent vital signs: Vitals:   11/03/24 1853  BP: (!) 165/69  Pulse: 66  Resp: 18  Temp: 98 F (36.7 C)  SpO2: 97%     General: Awake, no distress.  CV:  Good peripheral perfusion.  Resp:  Normal effort.  Abd:  No distention.  Neuro:  Alert and oriented to person place time, however during conversation she does get confused she has trouble with identifying objects in the room is  not able recognize my badge.  Remaining cranial nerves II through XII appear to be intact, I do not appreciate any slurred speech, appropriate strength and coordination and sensation in the arms and legs Other:     ED Results / Procedures / Treatments   Labs (all labs ordered are listed, but only abnormal results are displayed) Labs Reviewed  CBC - Abnormal; Notable for the following components:      Result Value   Hemoglobin 11.8 (*)    All other components within normal limits  COMPREHENSIVE METABOLIC PANEL WITH GFR - Abnormal; Notable for the following components:   CO2 19 (*)    Glucose, Bld 120 (*)    Calcium  8.5 (*)    Total Protein 6.4 (*)    Anion gap 19 (*)    All other components within normal limits  TSH  URINALYSIS, ROUTINE W REFLEX MICROSCOPIC     EKG     RADIOLOGY   PROCEDURES:  Critical Care performed: No  Procedures   MEDICATIONS ORDERED IN ED: Medications - No data  to display   IMPRESSION / MDM / ASSESSMENT AND PLAN / ED COURSE  I reviewed the triage vital signs and the nursing notes.                               Patient's presentation is most consistent with acute complicated illness / injury requiring diagnostic workup.  73 year old female who presents today with concern of altered mental status and encephalopathy.  She does appear to be confused in the exam room, having trouble with answering questions and word finding difficulty.  Symptoms have been ongoing for about 2 weeks now.  Will obtain labs and imaging, anticipate likely will warrant admission for encephalopathy.   Clinical Course as of 11/03/24 2250  Wed Nov 03, 2024  2249 CT imaging is without acute findings, awaiting urinalysis. [SK]    Clinical Course User Index [SK] Fernand Rossie HERO, MD     FINAL CLINICAL IMPRESSION(S) / ED DIAGNOSES   Final diagnoses:  Altered mental status, unspecified altered mental status type     Rx / DC Orders   ED Discharge Orders      None        Note:  This document was prepared using Dragon voice recognition software and may include unintentional dictation errors.   Fernand Rossie HERO, MD 11/03/24 [phone]

## 2024-11-04 ENCOUNTER — Observation Stay

## 2024-11-04 ENCOUNTER — Emergency Department

## 2024-11-04 DIAGNOSIS — I639 Cerebral infarction, unspecified: Secondary | ICD-10-CM | POA: Insufficient documentation

## 2024-11-04 DIAGNOSIS — T450X5A Adverse effect of antiallergic and antiemetic drugs, initial encounter: Secondary | ICD-10-CM | POA: Diagnosis not present

## 2024-11-04 DIAGNOSIS — Z79899 Other long term (current) drug therapy: Secondary | ICD-10-CM | POA: Diagnosis not present

## 2024-11-04 DIAGNOSIS — R4182 Altered mental status, unspecified: Secondary | ICD-10-CM

## 2024-11-04 DIAGNOSIS — G9340 Encephalopathy, unspecified: Secondary | ICD-10-CM | POA: Diagnosis present

## 2024-11-04 DIAGNOSIS — G40909 Epilepsy, unspecified, not intractable, without status epilepticus: Secondary | ICD-10-CM | POA: Insufficient documentation

## 2024-11-04 DIAGNOSIS — R251 Tremor, unspecified: Secondary | ICD-10-CM | POA: Diagnosis not present

## 2024-11-04 DIAGNOSIS — J439 Emphysema, unspecified: Secondary | ICD-10-CM | POA: Diagnosis not present

## 2024-11-04 LAB — GLUCOSE, CAPILLARY
Glucose-Capillary: 114 mg/dL — ABNORMAL HIGH (ref 70–99)
Glucose-Capillary: 157 mg/dL — ABNORMAL HIGH (ref 70–99)

## 2024-11-04 LAB — URINALYSIS, ROUTINE W REFLEX MICROSCOPIC
Bilirubin Urine: NEGATIVE
Glucose, UA: NEGATIVE mg/dL
Hgb urine dipstick: NEGATIVE
Ketones, ur: NEGATIVE mg/dL
Leukocytes,Ua: NEGATIVE
Nitrite: NEGATIVE
Protein, ur: NEGATIVE mg/dL
Specific Gravity, Urine: 1.015 (ref 1.005–1.030)
pH: 5 (ref 5.0–8.0)

## 2024-11-04 LAB — TSH: TSH: 1.55 u[IU]/mL (ref 0.350–4.500)

## 2024-11-04 LAB — CBG MONITORING, ED: Glucose-Capillary: 184 mg/dL — ABNORMAL HIGH (ref 70–99)

## 2024-11-04 LAB — HEMOGLOBIN A1C
Hgb A1c MFr Bld: 6.4 % — ABNORMAL HIGH (ref 4.8–5.6)
Mean Plasma Glucose: 136.98 mg/dL

## 2024-11-04 MED ORDER — AMITRIPTYLINE HCL 25 MG PO TABS
25.0000 mg | ORAL_TABLET | Freq: Every day | ORAL | Status: DC
  Administered 2024-11-04: 25 mg via ORAL
  Filled 2024-11-04: qty 1

## 2024-11-04 MED ORDER — ACETAMINOPHEN 325 MG PO TABS
650.0000 mg | ORAL_TABLET | Freq: Four times a day (QID) | ORAL | Status: DC | PRN
  Administered 2024-11-04 – 2024-11-05 (×2): 650 mg via ORAL
  Filled 2024-11-04 (×2): qty 2

## 2024-11-04 MED ORDER — ONDANSETRON HCL 4 MG PO TABS: 4.0000 mg | ORAL_TABLET | Freq: Four times a day (QID) | ORAL | Status: DC | PRN

## 2024-11-04 MED ORDER — METOPROLOL TARTRATE 50 MG PO TABS
100.0000 mg | ORAL_TABLET | Freq: Once | ORAL | Status: AC
  Administered 2024-11-04: 100 mg via ORAL
  Filled 2024-11-04: qty 2

## 2024-11-04 MED ORDER — HYDRALAZINE HCL 20 MG/ML IJ SOLN
5.0000 mg | Freq: Four times a day (QID) | INTRAMUSCULAR | Status: DC | PRN
  Administered 2024-11-04 – 2024-11-05 (×2): 5 mg via INTRAVENOUS
  Filled 2024-11-04 (×2): qty 1

## 2024-11-04 MED ORDER — HYDRALAZINE HCL 20 MG/ML IJ SOLN
10.0000 mg | Freq: Once | INTRAMUSCULAR | Status: AC
  Administered 2024-11-04: 10 mg via INTRAVENOUS
  Filled 2024-11-04: qty 1

## 2024-11-04 MED ORDER — METOPROLOL TARTRATE 50 MG PO TABS
100.0000 mg | ORAL_TABLET | Freq: Two times a day (BID) | ORAL | Status: DC
  Administered 2024-11-04 – 2024-11-05 (×2): 100 mg via ORAL
  Filled 2024-11-04 (×2): qty 2

## 2024-11-04 MED ORDER — OXYCODONE HCL ER 10 MG PO T12A
10.0000 mg | EXTENDED_RELEASE_TABLET | Freq: Once | ORAL | Status: AC
  Administered 2024-11-04: 10 mg via ORAL
  Filled 2024-11-04: qty 1

## 2024-11-04 MED ORDER — LISINOPRIL 5 MG PO TABS
5.0000 mg | ORAL_TABLET | Freq: Every day | ORAL | Status: DC
  Administered 2024-11-04 – 2024-11-05 (×2): 5 mg via ORAL
  Filled 2024-11-04 (×2): qty 1

## 2024-11-04 MED ORDER — HYDROXYZINE HCL 25 MG PO TABS
25.0000 mg | ORAL_TABLET | Freq: Every evening | ORAL | Status: DC | PRN
  Administered 2024-11-04 – 2024-11-05 (×2): 25 mg via ORAL
  Filled 2024-11-04 (×2): qty 1

## 2024-11-04 MED ORDER — AMLODIPINE BESYLATE 10 MG PO TABS
10.0000 mg | ORAL_TABLET | Freq: Every day | ORAL | Status: DC
  Administered 2024-11-04 – 2024-11-05 (×2): 10 mg via ORAL
  Filled 2024-11-04: qty 2
  Filled 2024-11-04: qty 1

## 2024-11-04 MED ORDER — LATANOPROST 0.005 % OP SOLN
1.0000 [drp] | Freq: Every day | OPHTHALMIC | Status: DC
  Filled 2024-11-04: qty 2.5

## 2024-11-04 MED ORDER — OXYCODONE HCL ER 10 MG PO T12A
10.0000 mg | EXTENDED_RELEASE_TABLET | Freq: Two times a day (BID) | ORAL | Status: DC
  Administered 2024-11-04 – 2024-11-05 (×2): 10 mg via ORAL
  Filled 2024-11-04 (×2): qty 1

## 2024-11-04 MED ORDER — ENOXAPARIN SODIUM 40 MG/0.4ML IJ SOSY
40.0000 mg | PREFILLED_SYRINGE | INTRAMUSCULAR | Status: DC
  Administered 2024-11-04 – 2024-11-05 (×2): 40 mg via SUBCUTANEOUS
  Filled 2024-11-04 (×2): qty 0.4

## 2024-11-04 MED ORDER — CARBIDOPA-LEVODOPA 10-100 MG PO TABS
1.0000 | ORAL_TABLET | Freq: Three times a day (TID) | ORAL | Status: DC
  Filled 2024-11-04: qty 1

## 2024-11-04 MED ORDER — INSULIN ASPART 100 UNIT/ML IJ SOLN
0.0000 [IU] | Freq: Three times a day (TID) | INTRAMUSCULAR | Status: DC
  Administered 2024-11-04: 3 [IU] via SUBCUTANEOUS
  Administered 2024-11-05: 2 [IU] via SUBCUTANEOUS
  Administered 2024-11-05: 1 [IU] via SUBCUTANEOUS
  Filled 2024-11-04: qty 2
  Filled 2024-11-04: qty 3
  Filled 2024-11-04: qty 1

## 2024-11-04 MED ORDER — HYDRALAZINE HCL 20 MG/ML IJ SOLN: 5.0000 mg | Freq: Four times a day (QID) | INTRAMUSCULAR | Status: DC | PRN

## 2024-11-04 MED ORDER — LEVETIRACETAM 500 MG PO TABS
500.0000 mg | ORAL_TABLET | Freq: Two times a day (BID) | ORAL | Status: DC
  Administered 2024-11-04 – 2024-11-05 (×3): 500 mg via ORAL
  Filled 2024-11-04 (×3): qty 1

## 2024-11-04 MED ORDER — FENOFIBRATE 54 MG PO TABS
54.0000 mg | ORAL_TABLET | Freq: Every day | ORAL | Status: DC
  Administered 2024-11-04 – 2024-11-05 (×2): 54 mg via ORAL
  Filled 2024-11-04 (×2): qty 1

## 2024-11-04 MED ORDER — ACETAMINOPHEN 650 MG RE SUPP: 650.0000 mg | Freq: Four times a day (QID) | RECTAL | Status: DC | PRN

## 2024-11-04 MED ORDER — ONDANSETRON HCL 4 MG/2ML IJ SOLN: 4.0000 mg | Freq: Four times a day (QID) | INTRAMUSCULAR | Status: DC | PRN

## 2024-11-04 MED ORDER — LACTATED RINGERS IV BOLUS
1000.0000 mL | Freq: Once | INTRAVENOUS | Status: AC
  Administered 2024-11-04: 1000 mL via INTRAVENOUS

## 2024-11-04 MED ORDER — BACLOFEN 10 MG PO TABS
10.0000 mg | ORAL_TABLET | Freq: Two times a day (BID) | ORAL | Status: DC | PRN
  Administered 2024-11-04 – 2024-11-05 (×2): 10 mg via ORAL
  Filled 2024-11-04 (×2): qty 1

## 2024-11-04 MED ORDER — METFORMIN HCL 500 MG PO TABS: 500.0000 mg | ORAL_TABLET | Freq: Two times a day (BID) | ORAL | Status: DC

## 2024-11-04 MED ORDER — ATORVASTATIN CALCIUM 20 MG PO TABS: 40.0000 mg | ORAL_TABLET | Freq: Every day | ORAL | Status: DC

## 2024-11-04 MED ORDER — GLIPIZIDE 5 MG PO TABS: 5.0000 mg | ORAL_TABLET | Freq: Two times a day (BID) | ORAL | Status: DC

## 2024-11-04 MED ORDER — METOCLOPRAMIDE HCL 10 MG PO TABS: 10.0000 mg | ORAL_TABLET | Freq: Three times a day (TID) | ORAL | Status: DC

## 2024-11-04 MED ORDER — GLIPIZIDE-METFORMIN HCL 5-500 MG PO TABS: 2.0000 | ORAL_TABLET | Freq: Two times a day (BID) | ORAL | Status: DC

## 2024-11-04 MED ADMIN — Aspirin Tab Delayed Release 81 MG: 81 mg | ORAL | NDC 77333003125

## 2024-11-04 MED FILL — Aspirin Tab Delayed Release 81 MG: 81.0000 mg | ORAL | Qty: 1 | Status: AC

## 2024-11-04 NOTE — Consult Note (Addendum)
 Reason for Consult:AMS Requesting Physician: Laurita  CC: AMS  I have been asked by Dr. Laurita to see this patient in consultation for AMS.  HPI: Nicole Pittman is an 73 y.o. female with medical history significant of chronic back pain on narcotics, COPD, seizure disorder, IIDM, diabetic gastroparesis, HTN, HLD, presented with altered mentations. Patient unable to provide full history therefore part of the history was obtained from the chart.   Patient takes OxyContin  5 mg twice daily for her back pain and about 2 weeks ago, [facility] told the [facility] that  OxyContin  out of stock and as a result, patient has not been able to take her OxyContin  for more than 2 weeks.  It seems it may have since been restarted.  Increasingly husband noticed that the patient has become more confused,  almost looks like she is demented when patient suddenly stopped talking in the middle of a conversation.  Chronically, patient has had tremor of her limbs developed sometime last year and progressively getting worse, husband reported that patient's is worse in the mornings compared to later of the day.  Family also noticed and patient has become more forgetful  not sure if she has been taking her medications right.  She was diagnosed with diabetic gastroparesis and has been taking Reglan  4 times a day for more than a year.  Patient has also started to have increased falls and difficulty with ambulation.    Past Medical History:  Diagnosis Date   Adhesive arachnoiditis    Arthritis    Back problem    spurs   COPD (chronic obstructive pulmonary disease) (HCC)    Diabetes (HCC)    High cholesterol    Hypertension    Neck problem    bad disc    Past Surgical History:  Procedure Laterality Date   BACK SURGERY  1978   L5   CHOLECYSTECTOMY  1992   KNEE SURGERY Bilateral 1993   OVARIAN CYST REMOVAL  1985   TONSILLECTOMY  1959   WRIST SURGERY Bilateral 2003    Family History  Problem Relation  Age of Onset   Rheum arthritis Mother    Hypertension Mother    Parkinson's disease Mother    Parkinsonism Mother    Diabetes Father    Breast cancer Maternal Aunt 53   Bone cancer Maternal Aunt    Lupus Maternal Aunt     Social History:  reports that she has quit smoking. Her smoking use included cigarettes. She has never used smokeless tobacco. She reports that she does not drink alcohol and does not use drugs.  Allergies[1]  Medications: I have reviewed the patient's current medications. Prior to Admission:   Prior to Admission medications  Medication Sig Start Date End Date Taking? Authorizing Provider  amitriptyline  (ELAVIL ) 25 MG tablet Take 25 mg by mouth at bedtime.   Yes [provider]  aspirin  81 MG tablet Take 81 mg by mouth daily with supper.    Yes [provider]  atorvastatin  (LIPITOR) 40 MG tablet Take 40 mg by mouth at bedtime.    Yes [provider]  baclofen  (LIORESAL ) 10 MG tablet Take 10 mg by mouth 2 (two) times daily as needed for muscle spasms. 01/01/16  Yes [provider]  Cholecalciferol  (VITAMIN D -3 PO) Take 1 capsule by mouth daily.   Yes [provider]  fenofibrate  (TRICOR ) 48 MG tablet Take 48 mg by mouth at bedtime.    Yes [provider]  glipiZIDE -metformin  (METAGLIP ) 5-500 MG tablet Take 2 tablets by mouth 2 (two) times daily. 12/25/15  Yes [provider]  hydrOXYzine (ATARAX/VISTARIL) 25 MG tablet Take 25 mg by mouth at bedtime as needed for anxiety. 08/23/20  Yes [provider]  latanoprost (XALATAN) 0.005 % ophthalmic solution Place 1 drop into both eyes at bedtime. 10/10/24  Yes [provider]  levETIRAcetam  (KEPPRA ) 500 MG tablet Take 500 mg by mouth 2 (two) times daily.   Yes [provider]  lisinopril  (ZESTRIL ) 5 MG tablet Take 5 mg by mouth daily.   Yes [provider]  metoCLOPramide  (REGLAN ) 10 MG tablet Take 10 mg by mouth 4 (four) times  daily -  before meals and at bedtime.   Yes [provider]  metoprolol  tartrate (LOPRESSOR ) 100 MG tablet Take 100 mg by mouth 2 (two) times daily. 11/08/19  Yes [provider]  oxyCODONE  (OXYCONTIN ) 10 mg 12 hr tablet Take 10 mg by mouth every 12 (twelve) hours. 12/19/15  Yes [provider]  amLODipine  (NORVASC ) 10 MG tablet TAKE 1 TABLET BY MOUTH EVERY DAY Patient not taking: Reported on 11/04/2024 08/13/21   Mona Vinie BROCKS, MD  EASY COMFORT LANCETS MISC 4 (four) times daily. for testing 12/14/15   [provider]  FREESTYLE LITE test strip 4 (four) times daily. for testing 12/14/15   [provider]  loperamide  (IMODIUM ) 2 MG capsule Take 2 capsules (4 mg total) by mouth every 6 (six) hours. For diarrhea Patient not taking: Reported on 11/04/2024 08/07/19   Mayo, Rockie Overly, MD  nitroGLYCERIN  (NITROSTAT ) 0.4 MG SL tablet Place 1 tablet (0.4 mg total) under the tongue every 5 (five) minutes as needed for chest pain. MAX 3 doses in 15 minutes 10/17/20 01/15/21  Meng, Hao, PA  ondansetron  (ZOFRAN -ODT) 4 MG disintegrating tablet Take 1 tablet (4 mg total) by mouth every 6 (six) hours as needed for nausea or vomiting. Patient not taking: Reported on 11/04/2024 03/16/23   Ward, [name], DO  oxyCODONE  (OXY IR/ROXICODONE ) 5 MG immediate release tablet Take 5 mg by mouth 2 (two) times daily.  Patient not taking: Reported on 11/04/2024 01/02/16   [provider]  PROAIR  HFA 108 (90 Base) MCG/ACT inhaler Inhale 2 puffs into the lungs 2 (two) times daily as needed for wheezing or shortness of breath.  Patient not taking: Reported on 10/17/2020 01/03/16   [provider]    Scheduled:  amitriptyline   25 mg Oral QHS   amLODipine   10 mg Oral Daily   aspirin  EC  81 mg Oral Q supper   atorvastatin   40 mg Oral QHS   enoxaparin  (LOVENOX ) injection  40 mg Subcutaneous Q24H   fenofibrate   54 mg Oral Daily   insulin  aspart  0-15 Units Subcutaneous TID  WC   latanoprost  1 drop Both Eyes QHS   levETIRAcetam   500 mg Oral BID   lisinopril   5 mg Oral Daily   metoprolol  tartrate  100 mg Oral BID   oxyCODONE   10 mg Oral Q12H    ROS: History obtained from the patient  General ROS: negative for - chills, fatigue, fever, night sweats, weight gain or weight loss Psychological ROS: as noted in HPI Ophthalmic ROS: negative for - blurry vision, double vision, eye pain or loss of vision ENT ROS: negative for - epistaxis, nasal discharge, oral lesions, sore throat, tinnitus or vertigo Allergy and Immunology ROS: negative for - hives or itchy/watery eyes Hematological and Lymphatic ROS: negative  for - bleeding problems, bruising or swollen lymph nodes Endocrine ROS: negative for - galactorrhea, hair pattern changes, polydipsia/polyuria or temperature intolerance Respiratory ROS: negative for - cough, hemoptysis, shortness of breath or wheezing Cardiovascular ROS: negative for - chest pain, dyspnea on exertion, edema or irregular heartbeat Gastrointestinal ROS: negative for - abdominal pain, diarrhea, hematemesis, nausea/vomiting or stool incontinence Genito-Urinary ROS: negative for - dysuria, hematuria, incontinence or urinary frequency/urgency Musculoskeletal ROS: pain Neurological ROS: as noted in HPI Dermatological ROS: negative for rash and skin lesion changes   Physical Examination: Blood pressure (!) 196/105, pulse 89, temperature 97.8 F (36.6 C), temperature source Oral, resp. rate 19, height 5' (1.524 m), weight 56.2 kg, SpO2 96%.  HEENT-  Normocephalic, no lesions, without obvious abnormality.  Normal external eye and conjunctiva.  Normal external ears. Normal external nose, mucus membranes and septum.   Cardiovascular- Single S1, S2 Lungs- CTA Abdomen- soft, non-tender; bowel sounds normal; no masses,  no organomegaly Extremities- no edema Musculoskeletal-no joint tenderness, deformity or swelling Skin-warm and dry, no  hyperpigmentation, vitiligo, or suspicious lesions  Neurological Examination   Mental Status: Alert, oriented to name, place, year and month.  Has some difficulty with simple calculations.  Speech fluent without evidence of aphasia.  Able to follow 3 step commands without difficulty. Cranial Nerves: II: Discs flat bilaterally; Visual fields grossly normal, pupils equal, round, reactive to light and accommodation III,IV, VI: ptosis not present, extra-ocular motions intact bilaterally V,VII: smile symmetric, facial light touch sensation normal bilaterally VIII: hearing normal bilaterally XI: bilateral shoulder shrug XII: midline tongue extension Motor: Right : Upper extremity   5/5    Left:     Upper extremity   5/5  Lower extremity   5/5     Lower extremity   5/5 Increased tone Sensory: Pinprick and light touch intact throughout, bilaterally Deep Tendon Reflexes: 2+ and symmetric with absent AJ's bilaterally Plantars: Right: downgoing   Left: downgoing Cerebellar: finger-to-nose with tremor bilaterally (right greater than left), and normal heel-to-shin testing Gait: not tested due to safety concerns      Laboratory Studies:   Basic Metabolic Panel: Recent Labs  Lab 11/03/24 2018  NA 139  K 3.6  CL 101  CO2 19*  GLUCOSE 120*  BUN 19  CREATININE 0.98  CALCIUM  8.5*    Liver Function Tests: Recent Labs  Lab 11/03/24 2018  AST 33  ALT 21  ALKPHOS 65  BILITOT 0.3  PROT 6.4*  ALBUMIN 4.0   No results for input(s): LIPASE, AMYLASE in the last 168 hours. No results for input(s): AMMONIA in the last 168 hours.  CBC: Recent Labs  Lab 11/03/24 2018  WBC 10.1  HGB 11.8*  HCT 36.1  MCV 89.1  PLT 336    Cardiac Enzymes: No results for input(s): CKTOTAL, CKMB, CKMBINDEX, TROPONINI in the last 168 hours.  BNP: Invalid input(s): POCBNP  CBG: No results for input(s): GLUCAP in the last 168 hours.  Microbiology: Results for orders placed or  performed during the hospital encounter of 01/23/20  SARS CORONAVIRUS 2 (TAT 6-24 HRS) Nasopharyngeal Nasopharyngeal Swab     Status: None   Collection Time: 01/23/20  6:34 AM   Specimen: Nasopharyngeal Swab  Result Value Ref Range Status   SARS Coronavirus 2 NEGATIVE NEGATIVE Final    Comment: (NOTE) SARS-CoV-2 target nucleic acids are NOT DETECTED. The SARS-CoV-2 RNA is generally detectable in upper and lower respiratory specimens during the acute phase of infection. Negative results do not preclude SARS-CoV-2  infection, do not rule out co-infections with other pathogens, and should not be used as the sole basis for treatment or other patient management decisions. Negative results must be combined with clinical observations, patient history, and epidemiological information. The expected result is Negative. Fact Sheet for Patients: hairslick.no Fact Sheet for Healthcare Providers: quierodirigir.com This test is not yet approved or cleared by the [place]  FDA and  has been authorized for detection and/or diagnosis of SARS-CoV-2 by FDA under an Emergency Use Authorization (EUA). This EUA will remain  in effect (meaning this test can be used) for the duration of the COVID-19 declaration under Section 56 4(b)(1) of the Act, 21 U.S.C. section 360bbb-3(b)(1), unless the authorization is terminated or revoked sooner. Performed at [facility], [address], [place], [place] [address]     Coagulation Studies: No results for input(s): LABPROT, INR in the last 72 hours.  Urinalysis:  Recent Labs  Lab 11/04/24 0353  COLORURINE YELLOW*  LABSPEC 1.015  PHURINE 5.0  GLUCOSEU NEGATIVE  HGBUR NEGATIVE  BILIRUBINUR NEGATIVE  KETONESUR NEGATIVE  PROTEINUR NEGATIVE  NITRITE NEGATIVE  LEUKOCYTESUR NEGATIVE    Lipid Panel:     Component Value Date/Time   CHOL 156 01/24/2020 0618   TRIG 160 (H) 01/24/2020 0618    HDL 63 01/24/2020 0618   CHOLHDL 2.5 01/24/2020 0618   VLDL 32 01/24/2020 0618   LDLCALC 61 01/24/2020 0618    HgbA1C:  Lab Results  Component Value Date   HGBA1C 7.1 (H) 01/23/2020    Urine Drug Screen:      Component Value Date/Time   LABOPIA NONE DETECTED 01/23/2020 1200   COCAINSCRNUR NONE DETECTED 01/23/2020 1200   LABBENZ NONE DETECTED 01/23/2020 1200   AMPHETMU NONE DETECTED 01/23/2020 1200   THCU NONE DETECTED 01/23/2020 1200   LABBARB NONE DETECTED 01/23/2020 1200    Alcohol Level: No results for input(s): ETH in the last 168 hours.  Imaging: MR BRAIN WO CONTRAST Result Date: 11/04/2024 EXAM: MR Brain without Intravenous Contrast. CLINICAL HISTORY: 73 year old female. Evaluation for cerebrovascular accident, weakness, and ambulatory dysfunction. Persistent hypertension. TECHNIQUE: Magnetic resonance images of the brain without intravenous contrast in multiple planes. CONTRAST: Without. COMPARISON: CT head 11/03/2024. FINDINGS: BRAIN: Brain volume is within normal limits for age. No cortical encephalomalacia or chronic cerebral blood products identified. Patchy periventricular and additional scattered cerebral white matter T2 and FLAIR hyperintensity is mild to moderate for age. Deep gray nuclei, brainstem and cerebellum are within normal limits. No hydrocephalus. No restricted diffusion to indicate acute infarction. No intracranial mass or hemorrhage. No midline shift or extra-axial fluid collection. No cerebellar tonsillar ectopia. The major vascular flow voids are preserved. Distal right vertebral artery appears to be dominant, normal variant. No hydrocephalus. ORBITS: The orbits are normal. SINUSES AND MASTOIDS: Chronic left maxillary sinusitis with mucoperiosteal thickening redemonstrated. Other paranasal sinuses and mastoids are well aerated. BONES: Bone marrow signal is within normal limits. Bulky degenerative appearing ligamentous hypertrophy about the odontoid. Partially  visible cervical spine chronic disc, endplate, and facet degeneration also. IMPRESSION: 1. No acute intracranial abnormality. 2. Mild to moderate for age cerebral white matter signal changes, most commonly due to chronic small vessel disease. 3. Chronic cervical spine degeneration.  Chronic left maxillary sinusitis. Electronically signed by: Helayne Hurst MD 11/04/2024 06:54 AM EST RP Workstation: [id number]   DG Chest Portable 1 View Result Date: 11/04/2024 EXAM: 1 VIEW(S) XRAY OF THE CHEST 11/04/2024 12:51:45 AM COMPARISON: CXR 01/23/2020. CLINICAL HISTORY: vague confusion, eval infiltrate,  aspiration FINDINGS: LUNGS AND PLEURA: Coarse and interstitial markings consistent with known emphysema. No pleural effusion. No pneumothorax. HEART AND MEDIASTINUM: No acute abnormality of the cardiac and mediastinal silhouettes. Atherosclerotic plaque. BONES AND SOFT TISSUES: No acute osseous abnormality. IMPRESSION: 1. No acute cardiopulmonary process. 2. Emphysema. Electronically signed by: Morgane Naveau MD 11/04/2024 01:09 AM EST RP Workstation: [id number]   CT Head Wo Contrast Result Date: 11/03/2024 EXAM: CT HEAD WITHOUT 11/03/2024 08:46:53 PM TECHNIQUE: CT of the head was performed without the administration of intravenous contrast. Automated exposure control, iterative reconstruction, and/or weight based adjustment of the mA/kV was utilized to reduce the radiation dose to as low as reasonably achievable. COMPARISON: None available. CLINICAL HISTORY: AMS FINDINGS: BRAIN AND VENTRICLES: No acute intracranial hemorrhage. No mass effect or midline shift. No extra-axial fluid collection. No evidence of acute infarct. No hydrocephalus. Subcortical and periventricular small vessel ischemic changes. Intracranial atherosclerosis. ORBITS: No acute abnormality. SINUSES AND MASTOIDS: No acute abnormality. SOFT TISSUES AND SKULL: No acute skull fracture. No acute soft tissue abnormality. IMPRESSION: 1. No acute intracranial  abnormality. Electronically signed by: Pinkie Pebbles MD 11/03/2024 08:48 PM EST RP Workstation: [id number]     Assessment/Plan: 73 y.o. female with medical history significant of chronic back pain on narcotics, COPD, seizure disorder, IIDM, diabetic gastroparesis, HTN, HLD, presented with altered mentations. Patient unable to provide full history therefore part of the history was obtained from the chart.   Patient takes OxyContin  5 mg twice daily for her back pain and about 2 weeks ago, [facility] that  [facility]xyContin  out of stock and as a result, patient has not been able to take her OxyContin  for more than 2 weeks.  It seems it may have since been restarted.  Increasingly husband noticed that the patient has become more confused,  almost looks like she is demented when patient suddenly stopped talking in the middle of a conversation.  Chronically, patient has had tremor of her limbs developed sometime last year and progressively getting worse, husband reported that patient's is worse in the mornings compared to later of the day.  Family also noticed and patient has become more forgetful  not sure if she has been taking her medications right.  She was diagnosed with diabetic gastroparesis and has been taking Reglan  4 times a day for more than a year. Patient has also started to have increased falls and difficulty with ambulation.  At baseline independent and helping to care for her son at home.   Etiology of mental status change is unclear.  This is somewhat of a quick progression for a dementia.  And although there may be some altered mentation with abrupt discontinuation of Oxycontin , would like to rule out other possibilities.  MRI of the brain personally reviewed and shows no acute changes.  Initial blood work, urine and CXR show no abnormalities.  Due to her GI issues would also like to rule out absorption/deficiency issues as well.  TSH normal.   Patient with  increased tone and tremor of the BUE's.  This may be PD picture which can often be accompanied by a dementia but may also be related to her prolonged use of Reglan .  This has been addressed with patient and husband but will not attempt to treat at this time since initially would like to address her mental status and not muddy the waters by adding new medications.   Patient with history of seizures.  On Keppra .  Will screen for subclinical  seizure activity with electrodiagnostic study.  If above unremarkable will consider the need for LP to rule out an autoimmune encephalitis   Recommendations: EEG Agree with continuation of Oxycontin  B1, B12, folate, ESR, B6, vitamin D , CRP  Case discussed with Dr. Laurita Sonny Hock, MD Neurology  11/04/2024, 11:56 AM           [1]  Allergies Allergen Reactions   Fentanyl Itching and Rash    PATCHES   Codeine Hives   Morphine And Codeine Hives   Symbicort [Budesonide-Formoterol Fumarate] Other (See Comments)    Made patient's chest hurt and affected vision (blurred)   Zanaflex [Tizanidine] Palpitations and Other (See Comments)    And worsened insomina

## 2024-11-04 NOTE — ED Notes (Signed)
 This tech walked into pts room and seen pt sliding off the stretcher. This tech and NT [place] repositioned pt and placed fall alarm on. Pt provided with iced water and expressed no further needs at this time.

## 2024-11-04 NOTE — ED Provider Notes (Signed)
 Patient received in signout from Dr. Fernand. From home with about 1 week of confusion, difficulty ambulating.  Nonfocal exam.  Quite hypertensive despite home medications.  Unrevealing workup with clear CT head, CXR, normal CBC and UA.  Metabolic panel with small anion gap.  Provide IV fluids in addition to home medications but she is unable to ambulate at her baseline.  Order MRI brain, order IV antihypertensives and consult with medicine for admission.  .Critical Care  Performed by: Claudene Rover, MD Authorized by: Claudene Rover, MD   Critical care provider statement:    Critical care time (minutes):  30   Critical care time was exclusive of:  Separately billable procedures and treating other patients   Critical care was necessary to treat or prevent imminent or life-threatening deterioration of the following conditions:  Cardiac failure and circulatory failure   Critical care was time spent personally by me on the following activities:  Development of treatment plan with patient or surrogate, discussions with consultants, evaluation of patient's response to treatment, examination of patient, ordering and review of laboratory studies, ordering and review of radiographic studies, ordering and performing treatments and interventions, pulse oximetry, re-evaluation of patient's condition and review of old charts     Claudene Rover, MD 11/04/24 [phone]

## 2024-11-04 NOTE — Progress Notes (Signed)
 Eeg done

## 2024-11-04 NOTE — TOC Initial Note (Signed)
 Transition of Care [facility]) - Initial/Assessment Note    Patient Details  Name: Nicole Pittman MRN: [id number] Date of Birth: 1951/05/14  Transition of [facility]) CM/SW Contact:    Lillyian Heidt L Peta Peachey, [name] Phone Number: 11/04/2024, 11:18 AM  Clinical Narrative:                  [facility] consult received for PCP needs. Patient needs a listing of outpatient neurology providers. Discharge instructions updated with a listing of provider in/near [facility].        Patient Goals and CMS Choice            Expected Discharge Plan and Services                                              Prior Living Arrangements/Services                       Activities of Daily Living      Permission Sought/Granted                  Emotional Assessment              Admission diagnosis:  CVA (cerebral vascular accident) [facility]) [I63.9] Encephalopathy acute [G93.40] Patient Active Problem List   Diagnosis Date Noted   CVA (cerebral vascular accident) (HCC) 11/04/2024   Acute encephalopathy 11/04/2024   Encephalopathy acute 11/04/2024   Hypertensive urgency 01/23/2020   Diabetes mellitus without complication (HCC) 01/23/2020   AKI (acute kidney injury) 01/23/2020   Chest pain 01/23/2020   RLS (restless legs syndrome) 01/23/2020   Elevated troponin 01/23/2020   Demand ischemia (HCC) 01/23/2020   Troponin level elevated    Colitis 08/04/2019   Paroxysmal atrial fibrillation (HCC)    Chest pain, precordial 07/08/2018   Tremor of face and hands 01/21/2016   HTN (hypertension) 01/21/2016   HLD (hyperlipidemia) 01/21/2016   Diabetes (HCC) 01/21/2016   COPD (chronic obstructive pulmonary disease) (HCC) 01/21/2016   PCP:  Catalina Bare, MD Pharmacy[phone], [place] - [address] [place] [address] Phone: [phone] Fax: [phone]     Social Drivers of Health (SDOH) Social History: SDOH  Screenings   Tobacco Use: Medium Risk (11/03/2024)   SDOH Interventions:     Readmission Risk Interventions     No data to display

## 2024-11-04 NOTE — Discharge Instructions (Signed)
 Outpatient Neurology Providers in/near [facility]  1. Hemang K. Maree, MD - Neurologist [address] [place], [place], [place] [address] Phone: Same as [facility] main line -- [phone] General neurology care in [place].  [facility]  2. Kaitlin Paich, PA-C, [facility] - Neurology practitioner [address] [place], [place], [place] [address] Neurology provider associated with [facility] (may assist in neurology care).  [facility]  3. Zachary E. Lane, MD - Neurologist [address] [place], [place], [place] [address] Neurology provider at [facility] (mixed reviews; contact to verify services).  [facility]  4. [facility] (includes Neurology services) [address], [place], [place] [address] Hours: Mon-Thu 8:00 AM - 5:00 PM; Fri 8:00 AM - 12:00 PM Provides outpatient neurology and related specialty services.  [facility]  5. [facility] (may refer for neurology) [address], [place], [place] [address] Primary care and specialty referrals, including neurology consults. (local known service; confirm when calling)  [facility] / [facility] (may accept patients from [facility])  6. [facility] Neurology - Neurology Specialty [facility]) [address], [place], [place] [address] Outpatient neurology services for a range of neurological conditions.  7. [facility] Neurological Care - Neurology Clinic [facility]) [address], [place], [place] [address] Older adult and general neurology services.  8. [facility] Clinic - Neurology Clinic [facility]) [address], [place], [place] [address] Comprehensive neurology services and subspecialties.  9. [facility] - Neurology Specialty [facility]) [address] [place], [place], [place] [address] Academic neurology care, subspecialty evaluation, and complex neurological conditions.  10. [facility] - Clinic 1L - Neurology [facility]) [address] [facility] [address] [place], [place], [place] [address] Outpatient neurology disorders clinic - includes epilepsy, movement disorders, etc.

## 2024-11-04 NOTE — Procedures (Signed)
 Patient Name: LAILYN APPELBAUM  MRN: [id number]  Epilepsy Attending: Arlin MALVA Krebs  Referring Physician/Provider: Laurita Cort DASEN, MD  Date: 11/04/2024  Duration: 29.30 mins  Patient history: 73 y.o. female with medical history of seizure disorder presented with altered mentation. EEG to evaluate for seizure  Level of alertness: Awake  AEDs during EEG study: LEV  Technical aspects: This EEG study was done with scalp electrodes positioned according to the 10-20 International system of electrode placement. Electrical activity was reviewed with band pass filter of 1-70Hz , sensitivity of 7 uV/mm, display speed of 40mm/sec with a 60Hz  notched filter applied as appropriate. EEG data were recorded continuously and digitally stored.  Video monitoring was available and reviewed as appropriate.  Description: The posterior dominant rhythm consists of 7.5 Hz activity of moderate voltage (25-35 uV) seen predominantly in posterior head regions, symmetric and reactive to eye opening and eye closing. EEG showed intermittent generalized 6 to 7 Hz theta slowing. Hyperventilation and photic stimulation were not performed.     ABNORMALITY - Intermittent slow, generalized  IMPRESSION: This study is suggestive of generalized cerebral dysfunction (encephalopathy). No seizures or epileptiform discharges were seen throughout the recording.  Vivan Agostino O Isidro Monks

## 2024-11-04 NOTE — H&P (Signed)
 History and Physical    Nicole Pittman FMW:[id number] DOB: 1951/05/18 DOA: 11/03/2024  PCP: Catalina Bare, MD (Confirm with patient/family/NH records and if not entered, this has to be entered at [facility] point of entry) Patient coming from: Home  I have personally briefly reviewed patient's old medical records in [facility]  Chief Complaint: Altered mentations  HPI: Nicole Pittman is a 73 y.o. female with medical history significant of chronic back pain on narcotics, COPD, seizure disorder, IIDM, diabetic gastroparesis, HTN, HLD, presented with altered mentations.  Patient only provided part of the history, most of the history provided by husband over the phone.  Husband reported that at baseline patient takes OxyContin  5 mg twice daily for her back pain and about 2 weeks ago, her local [facility] told the patient that  OxyContin  out of stock and as a result, patient has not been able to take her OxyContin  for more than 2 weeks.  Increasingly husband noticed that the patient has become more confused,  almost looks like she is demented when patient suddenly stopped talking in the middle of a conversation.  Chronically, patient has had tremor of her limbs developed sometime last year and progressively getting worse, husband reported that patient's is worse in the mornings compared to later of the day.  Family also noticed and patient has become more forgetful  not sure if she has been taking her medications right.  Patient only complains about uncontrolled back pain but denied any other active new symptoms.  No dysuria, no cough.  She does have a chronic diarrhea for which she takes intermittent Imodium .  She denied any nauseous vomiting.  She was diagnosed with diabetic gastroparesis and has been taking Reglan  4 times a day for more than a year.  ED Course: Afebrile, not tachycardia blood pressure 160/60 O2 saturation 97% on room air.  20 MRI negative for stroke but chronic  microvascular disease.  UA negative for UTI blood work showed WBC 10.1 BUN 19 creatinine 0.9 bicarb 15  Review of Systems: As per HPI otherwise 14 point review of systems negative.    Past Medical History:  Diagnosis Date   Adhesive arachnoiditis    Arthritis    Back problem    spurs   COPD (chronic obstructive pulmonary disease) (HCC)    Diabetes (HCC)    High cholesterol    Hypertension    Neck problem    bad disc    Past Surgical History:  Procedure Laterality Date   BACK SURGERY  1978   L5   CHOLECYSTECTOMY  1992   KNEE SURGERY Bilateral 1993   OVARIAN CYST REMOVAL  1985   TONSILLECTOMY  1959   WRIST SURGERY Bilateral 2003     reports that she has quit smoking. Her smoking use included cigarettes. She has never used smokeless tobacco. She reports that she does not drink alcohol and does not use drugs.  Allergies[1]  Family History  Problem Relation Age of Onset   Rheum arthritis Mother    Hypertension Mother    Parkinson's disease Mother    Parkinsonism Mother    Diabetes Father    Breast cancer Maternal Aunt 22   Bone cancer Maternal Aunt    Lupus Maternal Aunt     Prior to Admission medications  Medication Sig Start Date End Date Taking? Authorizing Provider  amitriptyline  (ELAVIL ) 25 MG tablet Take 25 mg by mouth at bedtime.   Yes [provider]  aspirin  81 MG tablet  Take 81 mg by mouth daily with supper.    Yes [provider]  atorvastatin  (LIPITOR) 40 MG tablet Take 40 mg by mouth at bedtime.    Yes [provider]  baclofen  (LIORESAL ) 10 MG tablet Take 10 mg by mouth 2 (two) times daily as needed for muscle spasms. 01/01/16  Yes [provider]  Cholecalciferol  (VITAMIN D -3 PO) Take 1 capsule by mouth daily.   Yes [provider]  fenofibrate  (TRICOR ) 48 MG tablet Take 48 mg by mouth at bedtime.    Yes [provider]  glipiZIDE -metformin  (METAGLIP ) 5-500 MG tablet Take 2 tablets by mouth 2 (two)  times daily. 12/25/15  Yes [provider]  hydrOXYzine (ATARAX/VISTARIL) 25 MG tablet Take 25 mg by mouth at bedtime as needed for anxiety. 08/23/20  Yes [provider]  latanoprost (XALATAN) 0.005 % ophthalmic solution Place 1 drop into both eyes at bedtime. 10/10/24  Yes [provider]  levETIRAcetam  (KEPPRA ) 500 MG tablet Take 500 mg by mouth 2 (two) times daily.   Yes [provider]  lisinopril  (ZESTRIL ) 5 MG tablet Take 5 mg by mouth daily.   Yes [provider]  metoCLOPramide  (REGLAN ) 10 MG tablet Take 10 mg by mouth 4 (four) times daily -  before meals and at bedtime.   Yes [provider]  metoprolol  tartrate (LOPRESSOR ) 100 MG tablet Take 100 mg by mouth 2 (two) times daily. 11/08/19  Yes [provider]  oxyCODONE  (OXYCONTIN ) 10 mg 12 hr tablet Take 10 mg by mouth every 12 (twelve) hours. 12/19/15  Yes [provider]  amLODipine  (NORVASC ) 10 MG tablet TAKE 1 TABLET BY MOUTH EVERY DAY Patient not taking: Reported on 11/04/2024 08/13/21   Mona Vinie BROCKS, MD  EASY COMFORT LANCETS MISC 4 (four) times daily. for testing 12/14/15   [provider]  FREESTYLE LITE test strip 4 (four) times daily. for testing 12/14/15   [provider]  loperamide  (IMODIUM ) 2 MG capsule Take 2 capsules (4 mg total) by mouth every 6 (six) hours. For diarrhea Patient not taking: Reported on 11/04/2024 08/07/19   Mayo, Rockie Overly, MD  nitroGLYCERIN  (NITROSTAT ) 0.4 MG SL tablet Place 1 tablet (0.4 mg total) under the tongue every 5 (five) minutes as needed for chest pain. MAX 3 doses in 15 minutes 10/17/20 01/15/21  Meng, Hao, PA  ondansetron  (ZOFRAN -ODT) 4 MG disintegrating tablet Take 1 tablet (4 mg total) by mouth every 6 (six) hours as needed for nausea or vomiting. Patient not taking: Reported on 11/04/2024 03/16/23   Ward, [name], DO  oxyCODONE  (OXY IR/ROXICODONE ) 5 MG immediate release tablet Take 5 mg by mouth 2 (two)  times daily.  Patient not taking: Reported on 11/04/2024 01/02/16   [provider]  PROAIR  HFA 108 (90 Base) MCG/ACT inhaler Inhale 2 puffs into the lungs 2 (two) times daily as needed for wheezing or shortness of breath.  Patient not taking: Reported on 10/17/2020 01/03/16   [provider]    Physical Exam: Vitals:   11/04/24 0400 11/04/24 0542 11/04/24 0600 11/04/24 0656  BP: (!) 209/116 (!) 225/163 (!) 196/105   Pulse: 87  89   Resp:   19   Temp:    97.8 F (36.6 C)  TempSrc:    Oral  SpO2:   96%   Weight:      Height:        Constitutional: NAD, calm, comfortable Vitals:   11/04/24 0400 11/04/24 0542 11/04/24  0600 11/04/24 0656  BP: (!) 209/116 (!) 225/163 (!) 196/105   Pulse: 87  89   Resp:   19   Temp:    97.8 F (36.6 C)  TempSrc:    Oral  SpO2:   96%   Weight:      Height:       Eyes: PERRL, lids and conjunctivae normal ENMT: Mucous membranes are moist. Posterior pharynx clear of any exudate or lesions.Normal dentition.  Neck: normal, supple, no masses, no thyromegaly Respiratory: clear to auscultation bilaterally, no wheezing, no crackles. Normal respiratory effort. No accessory muscle use.  Cardiovascular: Regular rate and rhythm, no murmurs / rubs / gallops. No extremity edema. 2+ pedal pulses. No carotid bruits.  Abdomen: no tenderness, no masses palpated. No hepatosplenomegaly. Bowel sounds positive.  Musculoskeletal: no clubbing / cyanosis. No joint deformity upper and lower extremities. Good ROM, no contractures. Normal muscle tone.  Skin: no rashes, lesions, ulcers. No induration Neurologic: CN 2-12 grossly intact. Sensation intact, DTR normal. Strength 5/5 in all 4.  Resting tremors about 3 Hz, increasing muscle rigidity, finger-to-nose test sluggish. Psychiatric: Normal judgment and insight. Alert and oriented x 3. Normal mood.    Labs on Admission: I have personally reviewed following labs and imaging studies  CBC: Recent Labs  Lab  11/03/24 2018  WBC 10.1  HGB 11.8*  HCT 36.1  MCV 89.1  PLT 336   Basic Metabolic Panel: Recent Labs  Lab 11/03/24 2018  NA 139  K 3.6  CL 101  CO2 19*  GLUCOSE 120*  BUN 19  CREATININE 0.98  CALCIUM  8.5*   GFR: Estimated Creatinine Clearance: 39.6 mL/min (by C-G formula based on SCr of 0.98 mg/dL). Liver Function Tests: Recent Labs  Lab 11/03/24 2018  AST 33  ALT 21  ALKPHOS 65  BILITOT 0.3  PROT 6.4*  ALBUMIN 4.0   No results for input(s): LIPASE, AMYLASE in the last 168 hours. No results for input(s): AMMONIA in the last 168 hours. Coagulation Profile: No results for input(s): INR, PROTIME in the last 168 hours. Cardiac Enzymes: No results for input(s): CKTOTAL, CKMB, CKMBINDEX, TROPONINI in the last 168 hours. BNP (last 3 results) No results for input(s): PROBNP in the last 8760 hours. HbA1C: No results for input(s): HGBA1C in the last 72 hours. CBG: No results for input(s): GLUCAP in the last 168 hours. Lipid Profile: No results for input(s): CHOL, HDL, LDLCALC, TRIG, CHOLHDL, LDLDIRECT in the last 72 hours. Thyroid  Function Tests: Recent Labs    11/03/24 2018  TSH 1.550   Anemia Panel: No results for input(s): VITAMINB12, FOLATE, FERRITIN, TIBC, IRON, RETICCTPCT in the last 72 hours. Urine analysis:    Component Value Date/Time   COLORURINE YELLOW (A) 11/04/2024 0353   APPEARANCEUR CLEAR (A) 11/04/2024 0353   LABSPEC 1.015 11/04/2024 0353   PHURINE 5.0 11/04/2024 0353   GLUCOSEU NEGATIVE 11/04/2024 0353   HGBUR NEGATIVE 11/04/2024 0353   BILIRUBINUR NEGATIVE 11/04/2024 0353   KETONESUR NEGATIVE 11/04/2024 0353   PROTEINUR NEGATIVE 11/04/2024 0353   NITRITE NEGATIVE 11/04/2024 0353   LEUKOCYTESUR NEGATIVE 11/04/2024 0353    Radiological Exams on Admission: MR BRAIN WO CONTRAST Result Date: 11/04/2024 EXAM: MR Brain without Intravenous Contrast. CLINICAL HISTORY: 73 year old female.  Evaluation for cerebrovascular accident, weakness, and ambulatory dysfunction. Persistent hypertension. TECHNIQUE: Magnetic resonance images of the brain without intravenous contrast in multiple planes. CONTRAST: Without. COMPARISON: CT head 11/03/2024. FINDINGS: BRAIN: Brain volume is within normal limits for age. No cortical encephalomalacia or  chronic cerebral blood products identified. Patchy periventricular and additional scattered cerebral white matter T2 and FLAIR hyperintensity is mild to moderate for age. Deep gray nuclei, brainstem and cerebellum are within normal limits. No hydrocephalus. No restricted diffusion to indicate acute infarction. No intracranial mass or hemorrhage. No midline shift or extra-axial fluid collection. No cerebellar tonsillar ectopia. The major vascular flow voids are preserved. Distal right vertebral artery appears to be dominant, normal variant. No hydrocephalus. ORBITS: The orbits are normal. SINUSES AND MASTOIDS: Chronic left maxillary sinusitis with mucoperiosteal thickening redemonstrated. Other paranasal sinuses and mastoids are well aerated. BONES: Bone marrow signal is within normal limits. Bulky degenerative appearing ligamentous hypertrophy about the odontoid. Partially visible cervical spine chronic disc, endplate, and facet degeneration also. IMPRESSION: 1. No acute intracranial abnormality. 2. Mild to moderate for age cerebral white matter signal changes, most commonly due to chronic small vessel disease. 3. Chronic cervical spine degeneration.  Chronic left maxillary sinusitis. Electronically signed by: Helayne Hurst MD 11/04/2024 06:54 AM EST RP Workstation: [id number]   DG Chest Portable 1 View Result Date: 11/04/2024 EXAM: 1 VIEW(S) XRAY OF THE CHEST 11/04/2024 12:51:45 AM COMPARISON: CXR 01/23/2020. CLINICAL HISTORY: vague confusion, eval infiltrate, aspiration FINDINGS: LUNGS AND PLEURA: Coarse and interstitial markings consistent with known emphysema. No  pleural effusion. No pneumothorax. HEART AND MEDIASTINUM: No acute abnormality of the cardiac and mediastinal silhouettes. Atherosclerotic plaque. BONES AND SOFT TISSUES: No acute osseous abnormality. IMPRESSION: 1. No acute cardiopulmonary process. 2. Emphysema. Electronically signed by: Morgane Naveau MD 11/04/2024 01:09 AM EST RP Workstation: [id number]   CT Head Wo Contrast Result Date: 11/03/2024 EXAM: CT HEAD WITHOUT 11/03/2024 08:46:53 PM TECHNIQUE: CT of the head was performed without the administration of intravenous contrast. Automated exposure control, iterative reconstruction, and/or weight based adjustment of the mA/kV was utilized to reduce the radiation dose to as low as reasonably achievable. COMPARISON: None available. CLINICAL HISTORY: AMS FINDINGS: BRAIN AND VENTRICLES: No acute intracranial hemorrhage. No mass effect or midline shift. No extra-axial fluid collection. No evidence of acute infarct. No hydrocephalus. Subcortical and periventricular small vessel ischemic changes. Intracranial atherosclerosis. ORBITS: No acute abnormality. SINUSES AND MASTOIDS: No acute abnormality. SOFT TISSUES AND SKULL: No acute skull fracture. No acute soft tissue abnormality. IMPRESSION: 1. No acute intracranial abnormality. Electronically signed by: Pinkie Pebbles MD 11/03/2024 08:48 PM EST RP Workstation: [id number]    EKG: None  Assessment/Plan Principal Problem:   Encephalopathy acute Active Problems:   Tremor of face and hands   Acute encephalopathy  (please populate well all problems here in Problem List. (For example, if patient is on BP meds at home and you resume or decide to hold them, it is a problem that needs to be her. Same for CAD, COPD, HLD and so on)  Acute metabolic encephalopathy - Improving - Likely secondary to alcoholic withdrawal - Resume OxyContin  - Other DDx, pertinent MRI negative for acute findings.  Mentation appears to be improving, low suspicion for other acute  CNS etiology such as breakthrough seizure.  One-time EEG ordered.  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  Explained to the patient regarding safety of Reglan  and patient agreed with hold off Reglan . - Discussed with neurology about indication for Sinemet, neurology agreed with consultation on this patient. - Consult case management for referral to neurology  for outpatient follow-up.  Seizure disorder - Clinically patient does not have breakthrough seizure. Check EEG-  Chronic back pain Narcotic dependence - Resume OxyContin  - Continue baclofen  - Continue at bedtime amitriptyline   IIDM - Hold off home metformin  and glimepiride -Start SSI  HTN - Resume amlodipine  and metoprolol    DVT prophylaxis: Lovenox  Code Status: Full code Family Communication: Husband over the phone Disposition Plan: Expect less than 2 midnight hospital stay Consults called: Neurology Admission status: Telemetry observation   Cort ONEIDA Mana MD [facility] Pager [phone]  11/04/2024, 9:32 AM       [1]  Allergies Allergen Reactions   Fentanyl Itching and Rash    PATCHES   Codeine Hives   Morphine And Codeine Hives   Symbicort [Budesonide-Formoterol Fumarate] Other (See Comments)    Made patient's chest hurt and affected vision (blurred)   Zanaflex [Tizanidine] Palpitations and Other (See Comments)    And worsened insomina

## 2024-11-04 NOTE — ED Notes (Signed)
 NURSE OLUFLA RN INFORMED OF ASSIGNED BED

## 2024-11-05 ENCOUNTER — Other Ambulatory Visit: Payer: Self-pay

## 2024-11-05 DIAGNOSIS — G9340 Encephalopathy, unspecified: Secondary | ICD-10-CM | POA: Diagnosis not present

## 2024-11-05 DIAGNOSIS — Z79899 Other long term (current) drug therapy: Secondary | ICD-10-CM | POA: Diagnosis not present

## 2024-11-05 DIAGNOSIS — R251 Tremor, unspecified: Secondary | ICD-10-CM | POA: Diagnosis not present

## 2024-11-05 DIAGNOSIS — R4182 Altered mental status, unspecified: Secondary | ICD-10-CM | POA: Diagnosis not present

## 2024-11-05 LAB — GLUCOSE, CAPILLARY
Glucose-Capillary: 125 mg/dL — ABNORMAL HIGH (ref 70–99)
Glucose-Capillary: 121 mg/dL — ABNORMAL HIGH (ref 70–99)

## 2024-11-05 MED ORDER — OXYCODONE HCL ER 10 MG PO T12A
10.0000 mg | EXTENDED_RELEASE_TABLET | Freq: Two times a day (BID) | ORAL | 0 refills | Status: AC
  Filled 2024-11-05: qty 14, 7d supply, fill #0

## 2024-11-05 MED ORDER — SODIUM BICARBONATE 650 MG PO TABS
650.0000 mg | ORAL_TABLET | Freq: Two times a day (BID) | ORAL | 0 refills | Status: AC
  Filled 2024-11-05: qty 14, 7d supply, fill #0

## 2024-11-05 MED ORDER — SODIUM BICARBONATE 650 MG PO TABS
650.0000 mg | ORAL_TABLET | Freq: Two times a day (BID) | ORAL | Status: DC
  Administered 2024-11-05: 650 mg via ORAL
  Filled 2024-11-05: qty 1

## 2024-11-05 NOTE — Plan of Care (Signed)

## 2024-11-05 NOTE — Discharge Summary (Signed)
 Physician Discharge Summary   Patient: Nicole Pittman MRN: [id number] DOB: 03-18-1951  Admit date:     11/03/2024  Discharge date: 11/05/2024  Discharge Physician: Murvin Mana   PCP: Catalina Bare, MD   Recommendations at discharge:   Follow-up with PCP in 1 week. Follow-up with neurology in 1 month  Discharge Diagnoses: Principal Problem:   Encephalopathy acute Active Problems:   Tremor of face and hands   Acute encephalopathy Seizure disorder. Chronic back pain. Type 2 diabetes with diabetic gastroparesis. Mild metabolic acidosis. COPD  Resolved Problems:   * No resolved hospital problems. *  Hospital Course:  Nicole Pittman is a 73 y.o. female with medical history significant of chronic back pain on narcotics, COPD, seizure disorder, IIDM, diabetic gastroparesis, HTN, HLD, presented with altered mentations.  Patient has been taking OxyContin /oxycodone  for many years, ran out of the medicine 2 weeks ago. Patient was seen by neurology, EEG was negative for seizure, thought altered mental status was due to withdrawing from oxycodone .  Restarted OxyContin  while in the hospital, mental status improved today. MRI of the brain showed small vessel disease, without acute changes. At this point, I have prescribed 7 days of OxyContin , patient be followed with PCP for additional prescription. Patient also has significant tremor, consider possibility of Parkinson disease.  Patient will be referred to neurology as outpatient.  I will discontinue Reglan .        Consultants: Neurology Procedures performed: None  Disposition: Home Diet recommendation:  Cardiac diet DISCHARGE MEDICATION: Allergies as of 11/05/2024       Reactions   Fentanyl Itching, Rash   PATCHES   Codeine Hives   Morphine And Codeine Hives   Symbicort [budesonide-formoterol Fumarate] Other (See Comments)   Made patient's chest hurt and affected vision (blurred)   Zanaflex [tizanidine]  Palpitations, Other (See Comments)   And worsened insomina        Medication List     STOP taking these medications    loperamide  2 MG capsule Commonly known as: IMODIUM    metoCLOPramide  10 MG tablet Commonly known as: REGLAN    ondansetron  4 MG disintegrating tablet Commonly known as: ZOFRAN -ODT   ProAir  HFA 108 (90 Base) MCG/ACT inhaler Generic drug: albuterol        TAKE these medications    amitriptyline  25 MG tablet Commonly known as: ELAVIL  Take 25 mg by mouth at bedtime.   amLODipine  10 MG tablet Commonly known as: NORVASC  TAKE 1 TABLET BY MOUTH EVERY DAY   aspirin  81 MG tablet Take 81 mg by mouth daily with supper.   atorvastatin  40 MG tablet Commonly known as: LIPITOR Take 40 mg by mouth at bedtime.   baclofen  10 MG tablet Commonly known as: LIORESAL  Take 10 mg by mouth 2 (two) times daily as needed for muscle spasms.   Easy Comfort Lancets Misc 4 (four) times daily. for testing   fenofibrate  48 MG tablet Commonly known as: TRICOR  Take 48 mg by mouth at bedtime.   FREESTYLE LITE test strip Generic drug: glucose blood 4 (four) times daily. for testing   glipiZIDE -metformin  5-500 MG tablet Commonly known as: METAGLIP  Take 2 tablets by mouth 2 (two) times daily.   hydrOXYzine 25 MG tablet Commonly known as: ATARAX Take 25 mg by mouth at bedtime as needed for anxiety.   latanoprost 0.005 % ophthalmic solution Commonly known as: XALATAN Place 1 drop into both eyes at bedtime.   levETIRAcetam  500 MG tablet Commonly known as: KEPPRA  Take 500 mg by mouth  2 (two) times daily.   lisinopril  5 MG tablet Commonly known as: ZESTRIL  Take 5 mg by mouth daily.   metoprolol  tartrate 100 MG tablet Commonly known as: LOPRESSOR  Take 100 mg by mouth 2 (two) times daily.   nitroGLYCERIN  0.4 MG SL tablet Commonly known as: NITROSTAT  Place 1 tablet (0.4 mg total) under the tongue every 5 (five) minutes as needed for chest pain. MAX 3 doses in 15  minutes   oxyCODONE  10 mg 12 hr tablet Commonly known as: OXYCONTIN  Take 1 tablet (10 mg total) by mouth every 12 (twelve) hours. What changed: Another medication with the same name was removed. Continue taking this medication, and follow the directions you see here.   sodium bicarbonate 650 MG tablet Take 1 tablet (650 mg total) by mouth 2 (two) times daily for 7 days.   VITAMIN D -3 PO Take 1 capsule by mouth daily.        Follow-up Information     Osei-Bonsu, Zachary, MD Follow up in 1 week(s).   Specialty: Internal Medicine Contact information: [address] [place] [address] [phone]         [facility] Follow up in 1 month(s).   Contact information: [address] [place]  [address] [phone]               Discharge Exam: [name]   11/03/24 1853  Weight: 56.2 kg   General exam: Appears calm and comfortable  Respiratory system: Clear to auscultation. Respiratory effort normal. Cardiovascular system: S1 & S2 heard, RRR. No JVD, murmurs, rubs, gallops or clicks. No pedal edema. Gastrointestinal system: Abdomen is nondistended, soft and nontender. No organomegaly or masses felt. Normal bowel sounds heard. Central nervous system: Alert and oriented x3.  Hand tremor. Extremities: Symmetric 5 x 5 power. Skin: No rashes, lesions or ulcers Psychiatry: Judgement and insight appear normal. Mood & affect appropriate.    Condition at discharge: fair  The results of significant diagnostics from this hospitalization (including imaging, microbiology, ancillary and laboratory) are listed below for reference.   Imaging Studies: EEG adult Result Date: 11/04/2024 Shelton Arlin KIDD, MD     11/04/2024  5:17 PM Patient Name: Nicole Pittman MRN: [id number] Epilepsy Attending: Arlin KIDD Shelton Referring Physician/Provider: Laurita Cort DASEN, MD Date: 11/04/2024 Duration: 29.30 mins Patient  history: 73 y.o. female with medical history of seizure disorder presented with altered mentation. EEG to evaluate for seizure Level of alertness: Awake AEDs during EEG study: LEV Technical aspects: This EEG study was done with scalp electrodes positioned according to the 10-20 International system of electrode placement. Electrical activity was reviewed with band pass filter of 1-70Hz , sensitivity of 7 uV/mm, display speed of 50mm/sec with a 60Hz  notched filter applied as appropriate. EEG data were recorded continuously and digitally stored.  Video monitoring was available and reviewed as appropriate. Description: The posterior dominant rhythm consists of 7.5 Hz activity of moderate voltage (25-35 uV) seen predominantly in posterior head regions, symmetric and reactive to eye opening and eye closing. EEG showed intermittent generalized 6 to 7 Hz theta slowing. Hyperventilation and photic stimulation were not performed.   ABNORMALITY - Intermittent slow, generalized IMPRESSION: This study is suggestive of generalized cerebral dysfunction (encephalopathy). No seizures or epileptiform discharges were seen throughout the recording. Arlin KIDD Shelton   MR BRAIN WO CONTRAST Result Date: 11/04/2024 EXAM: MR Brain without Intravenous Contrast. CLINICAL HISTORY: 73 year old female. Evaluation for cerebrovascular accident, weakness, and ambulatory dysfunction.  Persistent hypertension. TECHNIQUE: Magnetic resonance images of the brain without intravenous contrast in multiple planes. CONTRAST: Without. COMPARISON: CT head 11/03/2024. FINDINGS: BRAIN: Brain volume is within normal limits for age. No cortical encephalomalacia or chronic cerebral blood products identified. Patchy periventricular and additional scattered cerebral white matter T2 and FLAIR hyperintensity is mild to moderate for age. Deep gray nuclei, brainstem and cerebellum are within normal limits. No hydrocephalus. No restricted diffusion to indicate acute  infarction. No intracranial mass or hemorrhage. No midline shift or extra-axial fluid collection. No cerebellar tonsillar ectopia. The major vascular flow voids are preserved. Distal right vertebral artery appears to be dominant, normal variant. No hydrocephalus. ORBITS: The orbits are normal. SINUSES AND MASTOIDS: Chronic left maxillary sinusitis with mucoperiosteal thickening redemonstrated. Other paranasal sinuses and mastoids are well aerated. BONES: Bone marrow signal is within normal limits. Bulky degenerative appearing ligamentous hypertrophy about the odontoid. Partially visible cervical spine chronic disc, endplate, and facet degeneration also. IMPRESSION: 1. No acute intracranial abnormality. 2. Mild to moderate for age cerebral white matter signal changes, most commonly due to chronic small vessel disease. 3. Chronic cervical spine degeneration.  Chronic left maxillary sinusitis. Electronically signed by: Helayne Hurst MD 11/04/2024 06:54 AM EST RP Workstation: [id number]   DG Chest Portable 1 View Result Date: 11/04/2024 EXAM: 1 VIEW(S) XRAY OF THE CHEST 11/04/2024 12:51:45 AM COMPARISON: CXR 01/23/2020. CLINICAL HISTORY: vague confusion, eval infiltrate, aspiration FINDINGS: LUNGS AND PLEURA: Coarse and interstitial markings consistent with known emphysema. No pleural effusion. No pneumothorax. HEART AND MEDIASTINUM: No acute abnormality of the cardiac and mediastinal silhouettes. Atherosclerotic plaque. BONES AND SOFT TISSUES: No acute osseous abnormality. IMPRESSION: 1. No acute cardiopulmonary process. 2. Emphysema. Electronically signed by: Morgane Naveau MD 11/04/2024 01:09 AM EST RP Workstation: [id number]   CT Head Wo Contrast Result Date: 11/03/2024 EXAM: CT HEAD WITHOUT 11/03/2024 08:46:53 PM TECHNIQUE: CT of the head was performed without the administration of intravenous contrast. Automated exposure control, iterative reconstruction, and/or weight based adjustment of the mA/kV was  utilized to reduce the radiation dose to as low as reasonably achievable. COMPARISON: None available. CLINICAL HISTORY: AMS FINDINGS: BRAIN AND VENTRICLES: No acute intracranial hemorrhage. No mass effect or midline shift. No extra-axial fluid collection. No evidence of acute infarct. No hydrocephalus. Subcortical and periventricular small vessel ischemic changes. Intracranial atherosclerosis. ORBITS: No acute abnormality. SINUSES AND MASTOIDS: No acute abnormality. SOFT TISSUES AND SKULL: No acute skull fracture. No acute soft tissue abnormality. IMPRESSION: 1. No acute intracranial abnormality. Electronically signed by: Pinkie Pebbles MD 11/03/2024 08:48 PM EST RP Workstation: [id number]    Microbiology: Results for orders placed or performed during the hospital encounter of 01/23/20  SARS CORONAVIRUS 2 (TAT 6-24 HRS) Nasopharyngeal Nasopharyngeal Swab     Status: None   Collection Time: 01/23/20  6:34 AM   Specimen: Nasopharyngeal Swab  Result Value Ref Range Status   SARS Coronavirus 2 NEGATIVE NEGATIVE Final    Comment: (NOTE) SARS-CoV-2 target nucleic acids are NOT DETECTED. The SARS-CoV-2 RNA is generally detectable in upper and lower respiratory specimens during the acute phase of infection. Negative results do not preclude SARS-CoV-2 infection, do not rule out co-infections with other pathogens, and should not be used as the sole basis for treatment or other patient management decisions. Negative results must be combined with clinical observations, patient history, and epidemiological information. The expected result is Negative. Fact Sheet for Patients: hairslick.no Fact Sheet for Healthcare Providers: quierodirigir.com This test is not yet approved or cleared by the [place]   FDA and  has been authorized for detection and/or diagnosis of SARS-CoV-2 by FDA under an Emergency Use Authorization (EUA). This EUA will remain   in effect (meaning this test can be used) for the duration of the COVID-19 declaration under Section 56 4(b)(1) of the Act, 21 U.S.C. section 360bbb-3(b)(1), unless the authorization is terminated or revoked sooner. Performed at [facility], [address] [place], [place] [address]     Labs: CBC: Recent Labs  Lab 11/03/24 2018  WBC 10.1  HGB 11.8*  HCT 36.1  MCV 89.1  PLT 336   Basic Metabolic Panel: Recent Labs  Lab 11/03/24 2018  NA 139  K 3.6  CL 101  CO2 19*  GLUCOSE 120*  BUN 19  CREATININE 0.98  CALCIUM  8.5*   Liver Function Tests: Recent Labs  Lab 11/03/24 2018  AST 33  ALT 21  ALKPHOS 65  BILITOT 0.3  PROT 6.4*  ALBUMIN 4.0   CBG: Recent Labs  Lab 11/04/24 1220 11/04/24 1642 11/04/24 2147 11/05/24 0802  GLUCAP 184* 114* 157* 125*    Discharge time spent: 35 minutes.  Signed: Murvin Mana, MD [facility] 11/05/2024

## 2024-11-05 NOTE — Evaluation (Signed)
 Physical Therapy Evaluation Patient Details Name: Nicole Pittman MRN: [id number] DOB: October 11, 1951 Today's Date: 11/05/2024  History of Present Illness  Nicole Pittman is a 72 y.o. female with medical history significant of chronic back pain on narcotics, COPD, seizure disorder, IIDM, diabetic gastroparesis, HTN, HLD, presented with altered mentations.  Clinical Impression  Patient received in bed, shaky. Confused appearing but able to answer orientation questions. Requires max cues and assistance for mobility/initiation. Patient required mod A for bed mobility. Transfers with min A and ambulated ~100 feet with min A. Requires constant assistance to negotiate walker and for obstacle avoidance. Patient will continue to benefit from skilled PT to improve functional independence and safety with mobility.         If plan is discharge home, recommend the following: Supervision due to cognitive status;A lot of help with walking and/or transfers;A lot of help with bathing/dressing/bathroom;Assistance with cooking/housework;Direct supervision/assist for financial management;Assist for transportation;Direct supervision/assist for medications management;Help with stairs or ramp for entrance   Can travel by private vehicle    yes    Equipment Recommendations None recommended by PT  Recommendations for Other Services       Functional Status Assessment Patient has had a recent decline in their functional status and demonstrates the ability to make significant improvements in function in a reasonable and predictable amount of time.     Precautions / Restrictions Precautions Precautions: Fall Recall of Precautions/Restrictions: Impaired Restrictions Weight Bearing Restrictions Per Provider Order: No      Mobility  Bed Mobility Overal bed mobility: Needs Assistance Bed Mobility: Supine to Sit, Sit to Supine     Supine to sit: Mod assist, HOB elevated Sit to supine: Mod assist   General  bed mobility comments: poor initiation.    Transfers Overall transfer level: Needs assistance Equipment used: Rolling walker (2 wheels) Transfers: Sit to/from Stand Sit to Stand: Min assist                Ambulation/Gait Ambulation/Gait assistance: Min assist Gait Distance (Feet): 100 Feet Assistive device: Rolling walker (2 wheels) Gait Pattern/deviations: Step-through pattern, Decreased step length - right, Decreased step length - left, Decreased stride length Gait velocity: decreased     General Gait Details: Constant cues/assist needed for walker negotiation, safety with mobility.  Stairs            Wheelchair Mobility     Tilt Bed    Modified Rankin (Stroke Patients Only)       Balance Overall balance assessment: Needs assistance Sitting-balance support: Feet supported Sitting balance-Leahy Scale: Fair     Standing balance support: Bilateral upper extremity supported, During functional activity, Reliant on assistive device for balance Standing balance-Leahy Scale: Poor Standing balance comment: constant close supervision/min A for safety                             Pertinent Vitals/Pain Pain Assessment Pain Assessment: No/denies pain    Home Living Family/patient expects to be discharged to:: Private residence Living Arrangements: Spouse/significant other;Children Available Help at Discharge: Family;Available 24 hours/day Type of Home: Mobile home Home Access: Stairs to enter Entrance Stairs-Rails: Right Entrance Stairs-Number of Steps: 2   Home Layout: One level Home Equipment: Agricultural Consultant (2 wheels)      Prior Function Prior Level of Function : Independent/Modified Independent;Patient poor historian/Family not available             Mobility Comments: patient may be  poor historian, no family present at time of eval. States she did not use walker prior to admission.       Extremity/Trunk Assessment   Upper  Extremity Assessment Upper Extremity Assessment: Generalized weakness    Lower Extremity Assessment Lower Extremity Assessment: Generalized weakness    Cervical / Trunk Assessment Cervical / Trunk Assessment: Normal  Communication   Communication Communication: Impaired Factors Affecting Communication: Difficulty expressing self;Reduced clarity of speech    Cognition Arousal: Alert Behavior During Therapy: Flat affect   PT - Cognitive impairments: Difficult to assess, No family/caregiver present to determine baseline, Sequencing, Initiation, Problem solving, Safety/Judgement                       PT - Cognition Comments: she is alert and oriented when asked questions, but requires constant cues/assist during mobility. Following commands: Impaired Following commands impaired: Follows one step commands inconsistently     Cueing Cueing Techniques: Verbal cues, Gestural cues, Tactile cues     General Comments      Exercises     Assessment/Plan    PT Assessment Patient needs continued PT services  PT Problem List Decreased balance;Decreased mobility;Decreased coordination;Decreased cognition;Decreased knowledge of use of DME;Decreased safety awareness       PT Treatment Interventions DME instruction;Gait training;Stair training;Functional mobility training;Therapeutic activities;Therapeutic exercise;Neuromuscular re-education;Cognitive remediation;Balance training;Patient/family education    PT Goals (Current goals can be found in the Care Plan section)  Acute Rehab PT Goals Patient Stated Goal: none stated PT Goal Formulation: Patient unable to participate in goal setting Time For Goal Achievement: 11/19/24 Potential to Achieve Goals: Fair    Frequency Min 2X/week     Co-evaluation               AM-PAC PT 6 Clicks Mobility  Outcome Measure Help needed turning from your back to your side while in a flat bed without using bedrails?: A Lot Help  needed moving from lying on your back to sitting on the side of a flat bed without using bedrails?: A Lot Help needed moving to and from a bed to a chair (including a wheelchair)?: A Little Help needed standing up from a chair using your arms (e.g., wheelchair or bedside chair)?: A Little Help needed to walk in hospital room?: A Little Help needed climbing 3-5 steps with a railing? : A Little 6 Click Score: 16    End of Session   Activity Tolerance: Patient tolerated treatment well Patient left: in bed;with call bell/phone within reach;with bed alarm set Nurse Communication: Mobility status PT Visit Diagnosis: Other abnormalities of gait and mobility (R26.89);Difficulty in walking, not elsewhere classified (R26.2)    Time: 1249-1300 PT Time Calculation (min) (ACUTE ONLY): 11 min   Charges:   PT Evaluation $PT Eval Low Complexity: 1 Low   PT General Charges $$ ACUTE PT VISIT: 1 Visit         Georgi Navarrete, PT, GCS 11/05/2024,1:11 PM

## 2024-11-05 NOTE — Progress Notes (Signed)
 Subjective: Patient improved today but continues with tremor.    Objective: Current vital signs: BP (!) 157/69 (BP Location: Right Arm)   Pulse 75   Temp 97.7 F (36.5 C) (Oral)   Resp 17   Ht 5' (1.524 m)   Wt 56.2 kg   SpO2 93%   BMI 24.22 kg/m  Vital signs in last 24 hours: Temp:  [97.7 F (36.5 C)-98.9 F (37.2 C)] 97.7 F (36.5 C) (12/12 0759) Pulse Rate:  [75-110] 75 (12/12 0759) Resp:  [17-18] 17 (12/12 0759) BP: (132-168)/(52-116) 157/69 (12/12 0759) SpO2:  [93 %-98 %] 93 % (12/12 1303)  Intake/Output from previous day: No intake/output data recorded. Intake/Output this shift: Total I/O In: 120 [P.O.:120] Out: -  Nutritional status:  Diet Order             Diet Heart Room service appropriate? Yes; Fluid consistency: Thin  Diet effective now                   Neurologic Exam: Mental Status: Alert, oriented to name, place, year and month.  Has some difficulty with simple calculations.  Speech fluent without evidence of aphasia.  Able to follow 3 step commands without difficulty. Cranial Nerves: II: Visual fields grossly normal, pupils equal, round, reactive to light and accommodation III,IV, VI: ptosis not present, extra-ocular motions intact bilaterally V,VII: smile symmetric, facial light touch sensation normal bilaterally VIII: hearing normal bilaterally XI: bilateral shoulder shrug XII: midline tongue extension Motor: Right :  Upper extremity   5/5                                      Left:     Upper extremity   5/5             Lower extremity   5/5                                                  Lower extremity   5/5 Increased tone Sensory: Pinprick and light touch intact throughout, bilaterally Deep Tendon Reflexes: 2+ and symmetric with absent AJ's bilaterally Plantars: Right: downgoing                                Left: downgoing Cerebellar: finger-to-nose with tremor bilaterally (right greater than left), and normal heel-to-shin  testing Gait: not tested due to safety concerns    Lab Results: Basic Metabolic Panel: Recent Labs  Lab 11/03/24 2018  NA 139  K 3.6  CL 101  CO2 19*  GLUCOSE 120*  BUN 19  CREATININE 0.98  CALCIUM  8.5*    Liver Function Tests: Recent Labs  Lab 11/03/24 2018  AST 33  ALT 21  ALKPHOS 65  BILITOT 0.3  PROT 6.4*  ALBUMIN 4.0   No results for input(s): LIPASE, AMYLASE in the last 168 hours. No results for input(s): AMMONIA in the last 168 hours.  CBC: Recent Labs  Lab 11/03/24 2018  WBC 10.1  HGB 11.8*  HCT 36.1  MCV 89.1  PLT 336    Cardiac Enzymes: No results for input(s): CKTOTAL, CKMB, CKMBINDEX, TROPONINI in the last 168 hours.  Lipid Panel: No results for input(s): CHOL, TRIG,  HDL, CHOLHDL, VLDL, LDLCALC in the last 168 hours.  CBG: Recent Labs  Lab 11/04/24 1220 11/04/24 1642 11/04/24 2147 11/05/24 0802 11/05/24 1141  GLUCAP 184* 114* 157* 125* 121*    Microbiology: Results for orders placed or performed during the hospital encounter of 01/23/20  SARS CORONAVIRUS 2 (TAT 6-24 HRS) Nasopharyngeal Nasopharyngeal Swab     Status: None   Collection Time: 01/23/20  6:34 AM   Specimen: Nasopharyngeal Swab  Result Value Ref Range Status   SARS Coronavirus 2 NEGATIVE NEGATIVE Final    Comment: (NOTE) SARS-CoV-2 target nucleic acids are NOT DETECTED. The SARS-CoV-2 RNA is generally detectable in upper and lower respiratory specimens during the acute phase of infection. Negative results do not preclude SARS-CoV-2 infection, do not rule out co-infections with other pathogens, and should not be used as the sole basis for treatment or other patient management decisions. Negative results must be combined with clinical observations, patient history, and epidemiological information. The expected result is Negative. Fact Sheet for Patients: hairslick.no Fact Sheet for Healthcare  Providers: quierodirigir.com This test is not yet approved or cleared by the [place]  FDA and  has been authorized for detection and/or diagnosis of SARS-CoV-2 by FDA under an Emergency Use Authorization (EUA). This EUA will remain  in effect (meaning this test can be used) for the duration of the COVID-19 declaration under Section 56 4(b)(1) of the Act, 21 U.S.C. section 360bbb-3(b)(1), unless the authorization is terminated or revoked sooner. Performed at [facility], [address] [place], [place] [address]     Coagulation Studies: No results for input(s): LABPROT, INR in the last 72 hours.  Imaging: EEG adult Result Date: 11/04/2024 Shelton Arlin KIDD, MD     11/04/2024  5:17 PM Patient Name: Nicole Pittman MRN: [id number] Epilepsy Attending: Arlin KIDD Shelton Referring Physician/Provider: Laurita Cort DASEN, MD Date: 11/04/2024 Duration: 29.30 mins Patient history: 73 y.o. female with medical history of seizure disorder presented with altered mentation. EEG to evaluate for seizure Level of alertness: Awake AEDs during EEG study: LEV Technical aspects: This EEG study was done with scalp electrodes positioned according to the 10-20 International system of electrode placement. Electrical activity was reviewed with band pass filter of 1-70Hz , sensitivity of 7 uV/mm, display speed of 7mm/sec with a 60Hz  notched filter applied as appropriate. EEG data were recorded continuously and digitally stored.  Video monitoring was available and reviewed as appropriate. Description: The posterior dominant rhythm consists of 7.5 Hz activity of moderate voltage (25-35 uV) seen predominantly in posterior head regions, symmetric and reactive to eye opening and eye closing. EEG showed intermittent generalized 6 to 7 Hz theta slowing. Hyperventilation and photic stimulation were not performed.   ABNORMALITY - Intermittent slow, generalized IMPRESSION: This study is suggestive  of generalized cerebral dysfunction (encephalopathy). No seizures or epileptiform discharges were seen throughout the recording. Arlin KIDD Shelton   MR BRAIN WO CONTRAST Result Date: 11/04/2024 EXAM: MR Brain without Intravenous Contrast. CLINICAL HISTORY: 73 year old female. Evaluation for cerebrovascular accident, weakness, and ambulatory dysfunction. Persistent hypertension. TECHNIQUE: Magnetic resonance images of the brain without intravenous contrast in multiple planes. CONTRAST: Without. COMPARISON: CT head 11/03/2024. FINDINGS: BRAIN: Brain volume is within normal limits for age. No cortical encephalomalacia or chronic cerebral blood products identified. Patchy periventricular and additional scattered cerebral white matter T2 and FLAIR hyperintensity is mild to moderate for age. Deep gray nuclei, brainstem and cerebellum are within normal limits. No hydrocephalus. No restricted diffusion to indicate acute infarction. No  intracranial mass or hemorrhage. No midline shift or extra-axial fluid collection. No cerebellar tonsillar ectopia. The major vascular flow voids are preserved. Distal right vertebral artery appears to be dominant, normal variant. No hydrocephalus. ORBITS: The orbits are normal. SINUSES AND MASTOIDS: Chronic left maxillary sinusitis with mucoperiosteal thickening redemonstrated. Other paranasal sinuses and mastoids are well aerated. BONES: Bone marrow signal is within normal limits. Bulky degenerative appearing ligamentous hypertrophy about the odontoid. Partially visible cervical spine chronic disc, endplate, and facet degeneration also. IMPRESSION: 1. No acute intracranial abnormality. 2. Mild to moderate for age cerebral white matter signal changes, most commonly due to chronic small vessel disease. 3. Chronic cervical spine degeneration.  Chronic left maxillary sinusitis. Electronically signed by: Helayne Hurst MD 11/04/2024 06:54 AM EST RP Workstation: [id number]   DG Chest Portable 1  View Result Date: 11/04/2024 EXAM: 1 VIEW(S) XRAY OF THE CHEST 11/04/2024 12:51:45 AM COMPARISON: CXR 01/23/2020. CLINICAL HISTORY: vague confusion, eval infiltrate, aspiration FINDINGS: LUNGS AND PLEURA: Coarse and interstitial markings consistent with known emphysema. No pleural effusion. No pneumothorax. HEART AND MEDIASTINUM: No acute abnormality of the cardiac and mediastinal silhouettes. Atherosclerotic plaque. BONES AND SOFT TISSUES: No acute osseous abnormality. IMPRESSION: 1. No acute cardiopulmonary process. 2. Emphysema. Electronically signed by: Morgane Naveau MD 11/04/2024 01:09 AM EST RP Workstation: [id number]   CT Head Wo Contrast Result Date: 11/03/2024 EXAM: CT HEAD WITHOUT 11/03/2024 08:46:53 PM TECHNIQUE: CT of the head was performed without the administration of intravenous contrast. Automated exposure control, iterative reconstruction, and/or weight based adjustment of the mA/kV was utilized to reduce the radiation dose to as low as reasonably achievable. COMPARISON: None available. CLINICAL HISTORY: AMS FINDINGS: BRAIN AND VENTRICLES: No acute intracranial hemorrhage. No mass effect or midline shift. No extra-axial fluid collection. No evidence of acute infarct. No hydrocephalus. Subcortical and periventricular small vessel ischemic changes. Intracranial atherosclerosis. ORBITS: No acute abnormality. SINUSES AND MASTOIDS: No acute abnormality. SOFT TISSUES AND SKULL: No acute skull fracture. No acute soft tissue abnormality. IMPRESSION: 1. No acute intracranial abnormality. Electronically signed by: Pinkie Pebbles MD 11/03/2024 08:48 PM EST RP Workstation: [id number]    Medications: I have reviewed the patient's current medications. Scheduled:  amitriptyline   25 mg Oral QHS   amLODipine   10 mg Oral Daily   aspirin  EC  81 mg Oral Q supper   atorvastatin   40 mg Oral QHS   enoxaparin  (LOVENOX ) injection  40 mg Subcutaneous Q24H   fenofibrate   54 mg Oral Daily   insulin  aspart   0-15 Units Subcutaneous TID WC   latanoprost  1 drop Both Eyes QHS   levETIRAcetam   500 mg Oral BID   lisinopril   5 mg Oral Daily   metoprolol  tartrate  100 mg Oral BID   oxyCODONE   10 mg Oral Q12H   sodium bicarbonate  650 mg Oral BID    Assessment/Plan: 73 y.o. female with medical history significant of chronic back pain on narcotics, COPD, seizure disorder, IIDM, diabetic gastroparesis, HTN, HLD, presented with altered mental status.  Was recently off her pain medication and off abruptly.  Medication now reinstated and patient improved today.  Continues to have tremor. EEG only significant for generalized slowing.  TSH normal.    Recommendations: B1, B12, folate, ESR, B6, vitamin D , CRP Would have gait evaluated by PT today.  If patient at baseline patient cleared to be discharged and have tremor followed up on an outpatient basis.  If gait less than baseline would give a trial of Sinemet (short-acting) 25/100 BID  and evaluate response.     LOS: 0 days   Sonny Hock, MD Neurology  11/05/2024  1:32 PM    n

## 2024-11-05 NOTE — TOC Progression Note (Signed)
 Transition of Care [facility]) - Progression Note    Patient Details  Name: Nicole Pittman MRN: [id number] Date of Birth: February 28, 1951  Transition of Care [facility]) CM/SW Contact  Dalia GORMAN Fuse, RN Phone Number: 11/05/2024, 1:25 PM  Clinical Narrative:     Therapy recs are for [facility] PT/OT and DME walker. Referral for DME sent to [facility], [facility] accepted. Referral for [facility] PT/OT sent in the hub, [facility] was selected because they were the first to accept.                      Expected Discharge Plan and Services         Expected Discharge Date: 11/05/24                                     Social Drivers of Health (SDOH) Interventions SDOH Screenings   Food Insecurity: Patient Declined (11/04/2024)  Housing: Patient Declined (11/04/2024)  Transportation Needs: Patient Declined (11/04/2024)  Utilities: Patient Declined (11/04/2024)  Social Connections: Patient Declined (11/04/2024)  Tobacco Use: Medium Risk (11/03/2024)    Readmission Risk Interventions     No data to display

## 2024-11-05 NOTE — Care Management Obs Status (Signed)
 MEDICARE OBSERVATION STATUS NOTIFICATION   Patient Details  Name: Nicole Pittman MRN: [id number] Date of Birth: 01-Jun-1951   Medicare Observation Status Notification Given:  Chaney BRANDY CHRISTIANE LELON, CMA 11/05/2024, 11:30 AM

## 2024-12-27 ENCOUNTER — Emergency Department

## 2024-12-27 ENCOUNTER — Observation Stay
Admission: EM | Admit: 2024-12-27 | Source: Home / Self Care | Attending: Internal Medicine | Admitting: Internal Medicine

## 2024-12-27 ENCOUNTER — Other Ambulatory Visit: Payer: Self-pay

## 2024-12-27 DIAGNOSIS — E871 Hypo-osmolality and hyponatremia: Secondary | ICD-10-CM | POA: Insufficient documentation

## 2024-12-27 DIAGNOSIS — R4182 Altered mental status, unspecified: Secondary | ICD-10-CM | POA: Diagnosis not present

## 2024-12-27 DIAGNOSIS — E876 Hypokalemia: Secondary | ICD-10-CM | POA: Insufficient documentation

## 2024-12-27 DIAGNOSIS — L8990 Pressure ulcer of unspecified site, unspecified stage: Secondary | ICD-10-CM | POA: Insufficient documentation

## 2024-12-27 DIAGNOSIS — I10 Essential (primary) hypertension: Secondary | ICD-10-CM | POA: Diagnosis present

## 2024-12-27 DIAGNOSIS — E872 Acidosis, unspecified: Secondary | ICD-10-CM | POA: Insufficient documentation

## 2024-12-27 DIAGNOSIS — G9340 Encephalopathy, unspecified: Secondary | ICD-10-CM | POA: Diagnosis present

## 2024-12-27 DIAGNOSIS — J9601 Acute respiratory failure with hypoxia: Secondary | ICD-10-CM

## 2024-12-27 LAB — RESP PANEL BY RT-PCR (RSV, FLU A&B, COVID)  RVPGX2
Influenza A by PCR: NEGATIVE
Influenza B by PCR: NEGATIVE
Resp Syncytial Virus by PCR: NEGATIVE
SARS Coronavirus 2 by RT PCR: NEGATIVE

## 2024-12-27 LAB — URINALYSIS, W/ REFLEX TO CULTURE (INFECTION SUSPECTED)
Bilirubin Urine: NEGATIVE
Glucose, UA: 50 mg/dL — AB
Hgb urine dipstick: NEGATIVE
Ketones, ur: 5 mg/dL — AB
Leukocytes,Ua: NEGATIVE
Nitrite: NEGATIVE
Protein, ur: NEGATIVE mg/dL
Specific Gravity, Urine: 1.016 (ref 1.005–1.030)
Squamous Epithelial / HPF: 0 /HPF (ref 0–5)
pH: 5 (ref 5.0–8.0)

## 2024-12-27 LAB — CBC
HCT: 36.6 % (ref 36.0–46.0)
Hemoglobin: 12.1 g/dL (ref 12.0–15.0)
MCH: 29.3 pg (ref 26.0–34.0)
MCHC: 33.1 g/dL (ref 30.0–36.0)
MCV: 88.6 fL (ref 80.0–100.0)
Platelets: 348 10*3/uL (ref 150–400)
RBC: 4.13 MIL/uL (ref 3.87–5.11)
RDW: 13.7 % (ref 11.5–15.5)
WBC: 13.4 10*3/uL — ABNORMAL HIGH (ref 4.0–10.5)
nRBC: 0 % (ref 0.0–0.2)

## 2024-12-27 LAB — CBC WITH DIFFERENTIAL/PLATELET
Abs Immature Granulocytes: 0.05 10*3/uL (ref 0.00–0.07)
Basophils Absolute: 0.1 10*3/uL (ref 0.0–0.1)
Basophils Relative: 1 %
Eosinophils Absolute: 0 10*3/uL (ref 0.0–0.5)
Eosinophils Relative: 0 %
HCT: 38.7 % (ref 36.0–46.0)
Hemoglobin: 12.7 g/dL (ref 12.0–15.0)
Immature Granulocytes: 1 %
Lymphocytes Relative: 20 %
Lymphs Abs: 2.1 10*3/uL (ref 0.7–4.0)
MCH: 29.1 pg (ref 26.0–34.0)
MCHC: 32.8 g/dL (ref 30.0–36.0)
MCV: 88.6 fL (ref 80.0–100.0)
Monocytes Absolute: 0.8 10*3/uL (ref 0.1–1.0)
Monocytes Relative: 8 %
Neutro Abs: 7.6 10*3/uL (ref 1.7–7.7)
Neutrophils Relative %: 70 %
Platelets: 344 10*3/uL (ref 150–400)
RBC: 4.37 MIL/uL (ref 3.87–5.11)
RDW: 13.7 % (ref 11.5–15.5)
WBC: 10.7 10*3/uL — ABNORMAL HIGH (ref 4.0–10.5)
nRBC: 0 % (ref 0.0–0.2)

## 2024-12-27 LAB — COMPREHENSIVE METABOLIC PANEL WITH GFR
ALT: 23 U/L (ref 0–44)
AST: 23 U/L (ref 15–41)
Albumin: 4.7 g/dL (ref 3.5–5.0)
Alkaline Phosphatase: 74 U/L (ref 38–126)
Anion gap: 19 — ABNORMAL HIGH (ref 5–15)
BUN: 18 mg/dL (ref 8–23)
CO2: 24 mmol/L (ref 22–32)
Calcium: 9.5 mg/dL (ref 8.9–10.3)
Chloride: 94 mmol/L — ABNORMAL LOW (ref 98–111)
Creatinine, Ser: 0.94 mg/dL (ref 0.44–1.00)
GFR, Estimated: >60 mL/min
Glucose, Bld: 197 mg/dL — ABNORMAL HIGH (ref 70–99)
Potassium: 4.1 mmol/L (ref 3.5–5.1)
Sodium: 137 mmol/L (ref 135–145)
Total Bilirubin: 0.6 mg/dL (ref 0.0–1.2)
Total Protein: 7.6 g/dL (ref 6.5–8.1)

## 2024-12-27 LAB — CREATININE, SERUM
Creatinine, Ser: 0.86 mg/dL (ref 0.44–1.00)
GFR, Estimated: >60 mL/min

## 2024-12-27 LAB — LACTIC ACID, PLASMA
Lactic Acid, Venous: 2.9 mmol/L (ref 0.5–1.9)
Lactic Acid, Venous: 3.2 mmol/L (ref 0.5–1.9)

## 2024-12-27 LAB — BLOOD GAS, VENOUS
Acid-Base Excess: 5.8 mmol/L — ABNORMAL HIGH (ref 0.0–2.0)
Bicarbonate: 30.6 mmol/L — ABNORMAL HIGH (ref 20.0–28.0)
O2 Saturation: 50.6 %
Patient temperature: 37
pCO2, Ven: 44 mmHg (ref 44–60)
pH, Ven: 7.45 — ABNORMAL HIGH (ref 7.25–7.43)
pO2, Ven: 36 mmHg (ref 32–45)

## 2024-12-27 LAB — CK: Total CK: 63 U/L (ref 38–234)

## 2024-12-27 LAB — URINE DRUG SCREEN
Amphetamines: NEGATIVE
Barbiturates: NEGATIVE
Benzodiazepines: NEGATIVE
Cocaine: NEGATIVE
Fentanyl: NEGATIVE
Methadone Scn, Ur: NEGATIVE
Opiates: NEGATIVE
Tetrahydrocannabinol: NEGATIVE

## 2024-12-27 LAB — CBG MONITORING, ED: Glucose-Capillary: 166 mg/dL — ABNORMAL HIGH (ref 70–99)

## 2024-12-27 MED ORDER — LISINOPRIL 5 MG PO TABS: 5.0000 mg | ORAL_TABLET | Freq: Every day | ORAL | Status: DC

## 2024-12-27 MED ORDER — HYDROXYZINE HCL 25 MG PO TABS: 25.0000 mg | ORAL_TABLET | Freq: Every evening | ORAL | Status: DC | PRN

## 2024-12-27 MED ORDER — SODIUM CHLORIDE 0.9 % IV SOLN: INTRAVENOUS | Status: DC

## 2024-12-27 MED ORDER — ENOXAPARIN SODIUM 40 MG/0.4ML IJ SOSY
40.0000 mg | PREFILLED_SYRINGE | INTRAMUSCULAR | Status: DC
  Administered 2024-12-27 – 2024-12-30 (×4): 40 mg via SUBCUTANEOUS
  Filled 2024-12-27 (×4): qty 0.4

## 2024-12-27 MED ORDER — LATANOPROST 0.005 % OP SOLN
1.0000 [drp] | Freq: Every day | OPHTHALMIC | Status: AC
  Administered 2024-12-28 – 2024-12-31 (×4): 1 [drp] via OPHTHALMIC
  Filled 2024-12-27 (×2): qty 2.5

## 2024-12-27 MED ORDER — ONDANSETRON HCL 4 MG/2ML IJ SOLN: 4.0000 mg | Freq: Four times a day (QID) | INTRAMUSCULAR | Status: DC | PRN

## 2024-12-27 MED ORDER — DOCUSATE SODIUM 100 MG PO CAPS: 100.0000 mg | ORAL_CAPSULE | Freq: Two times a day (BID) | ORAL | Status: DC

## 2024-12-27 MED ORDER — IOHEXOL 300 MG/ML  SOLN
100.0000 mL | Freq: Once | INTRAMUSCULAR | Status: AC | PRN
  Administered 2024-12-27: 100 mL via INTRAVENOUS

## 2024-12-27 MED ORDER — ASPIRIN 81 MG PO CHEW
324.0000 mg | CHEWABLE_TABLET | Freq: Once | ORAL | Status: DC
  Filled 2024-12-27: qty 4

## 2024-12-27 MED ORDER — SENNOSIDES-DOCUSATE SODIUM 8.6-50 MG PO TABS: 1.0000 | ORAL_TABLET | Freq: Every evening | ORAL | Status: DC | PRN

## 2024-12-27 MED ORDER — ASPIRIN 300 MG RE SUPP
300.0000 mg | Freq: Once | RECTAL | Status: AC
  Administered 2024-12-27: 300 mg via RECTAL
  Filled 2024-12-27: qty 1

## 2024-12-27 MED ORDER — HYDRALAZINE HCL 20 MG/ML IJ SOLN
10.0000 mg | Freq: Four times a day (QID) | INTRAMUSCULAR | Status: DC | PRN
  Administered 2024-12-27 – 2024-12-28 (×2): 10 mg via INTRAVENOUS
  Filled 2024-12-27 (×2): qty 1

## 2024-12-27 MED ORDER — SODIUM CHLORIDE 0.9 % IV BOLUS: 1000.0000 mL | Freq: Once | INTRAVENOUS | Status: DC

## 2024-12-27 MED ORDER — INSULIN ASPART 100 UNIT/ML IJ SOLN: 0.0000 [IU] | Freq: Three times a day (TID) | INTRAMUSCULAR | Status: DC

## 2024-12-27 MED ORDER — ACETAMINOPHEN 650 MG RE SUPP
650.0000 mg | Freq: Four times a day (QID) | RECTAL | Status: DC | PRN
  Administered 2024-12-27: 650 mg via RECTAL
  Filled 2024-12-27: qty 2

## 2024-12-27 MED ORDER — METOPROLOL TARTRATE 5 MG/5ML IV SOLN
5.0000 mg | Freq: Once | INTRAVENOUS | Status: AC
  Administered 2024-12-27: 5 mg via INTRAVENOUS
  Filled 2024-12-27: qty 5

## 2024-12-27 MED ORDER — INSULIN ASPART 100 UNIT/ML IJ SOLN: 0.0000 [IU] | Freq: Every day | INTRAMUSCULAR | Status: DC

## 2024-12-27 MED ORDER — ONDANSETRON HCL 4 MG PO TABS: 4.0000 mg | ORAL_TABLET | Freq: Four times a day (QID) | ORAL | Status: DC | PRN

## 2024-12-27 MED ORDER — LEVETIRACETAM 500 MG PO TABS: 500.0000 mg | ORAL_TABLET | Freq: Two times a day (BID) | ORAL | Status: DC

## 2024-12-27 MED ORDER — METOPROLOL TARTRATE 50 MG PO TABS: 100.0000 mg | ORAL_TABLET | Freq: Two times a day (BID) | ORAL | Status: DC

## 2024-12-27 MED ORDER — OXYCODONE HCL ER 10 MG PO T12A: 10.0000 mg | EXTENDED_RELEASE_TABLET | Freq: Two times a day (BID) | ORAL | Status: DC

## 2024-12-27 MED ORDER — FENOFIBRATE 54 MG PO TABS
54.0000 mg | ORAL_TABLET | Freq: Every day | ORAL | Status: DC
  Filled 2024-12-27: qty 1

## 2024-12-27 MED ORDER — LACTATED RINGERS IV BOLUS
1000.0000 mL | Freq: Once | INTRAVENOUS | Status: AC
  Administered 2024-12-27: 1000 mL via INTRAVENOUS

## 2024-12-27 MED ORDER — ACETAMINOPHEN 325 MG PO TABS
650.0000 mg | ORAL_TABLET | Freq: Four times a day (QID) | ORAL | Status: DC | PRN
  Administered 2024-12-28 – 2024-12-29 (×2): 650 mg via ORAL
  Filled 2024-12-27 (×2): qty 2

## 2024-12-27 MED ORDER — ASPIRIN 81 MG PO TBEC: 81.0000 mg | DELAYED_RELEASE_TABLET | Freq: Every day | ORAL | Status: DC

## 2024-12-27 MED ORDER — METOPROLOL TARTRATE 5 MG/5ML IV SOLN
5.0000 mg | Freq: Four times a day (QID) | INTRAVENOUS | Status: DC | PRN
  Administered 2024-12-28: 5 mg via INTRAVENOUS
  Filled 2024-12-27: qty 5

## 2024-12-27 MED ORDER — ATORVASTATIN CALCIUM 20 MG PO TABS: 40.0000 mg | ORAL_TABLET | Freq: Every day | ORAL | Status: DC

## 2024-12-27 NOTE — H&P (Signed)
 " History and Physical    Patient: Nicole Pittman FMW:[id number] DOB: 20-May-1951 DOA: 12/27/2024 DOS: the patient was seen and examined on 12/27/2024 PCP: Catalina Bare, MD  Patient coming from: Home  Chief Complaint:  Chief Complaint  Patient presents with   Altered Mental Status   HPI: Nicole Pittman is a 74 y.o. female with medical history significant of COPD chronic back pain on narcotics, seizure disorder, type II diabetes, diabetic gastroparesis, hypertension, hyperlipidemia comes into the emergency room with altered mental status. No family is at bedside. Unable to reach patient's husband Nicole Pittman. Left voicemail. Patient is a very poor historian. She is awake alert oriented to place in person. She is unable to give me any details as to why she is here in the hospital and what happened. She keeps saying I don't know to majority of the questions.  [facility] ER physician patient's husband called EMS she was found down on the floor. Details not available. No traumatic injury reported. Duration of down time unknown.  In the ED patient is awake. No focal weakness. Able to move all extremities. She chronically has tremors of her limbs as documented in old records. He does not know if she is taking her medication right.  Workup in the ER blood pressure 223/84 mild tachycardia, Lactic acid 2.9 electrolyte stable urine analysis negative. Chest x-ray negative. CT of theChest abdomen pelvis nothing acute. CT had unremarkable.  Please note patient had recent extensive workup for stroke including MRI CT head EEG in December 2025 when she came in with similar presentation.  Urine drug screen pending. Patient is being admitted for altered mental status although her mentation is much improved   Review of Systems: As mentioned in the history of present illness. All other systems reviewed and are negative. Past Medical History:  Diagnosis Date   Adhesive arachnoiditis    Arthritis     Back problem    spurs   COPD (chronic obstructive pulmonary disease) (HCC)    Diabetes (HCC)    High cholesterol    Hypertension    Neck problem    bad disc   Past Surgical History:  Procedure Laterality Date   BACK SURGERY  1978   L5   CHOLECYSTECTOMY  1992   KNEE SURGERY Bilateral 1993   OVARIAN CYST REMOVAL  1985   TONSILLECTOMY  1959   WRIST SURGERY Bilateral 2003   Social History:  reports that she has quit smoking. Her smoking use included cigarettes. She has never used smokeless tobacco. She reports that she does not drink alcohol and does not use drugs.  Allergies[1]  Family History  Problem Relation Age of Onset   Rheum arthritis Mother    Hypertension Mother    Parkinson's disease Mother    Parkinsonism Mother    Diabetes Father    Breast cancer Maternal Aunt 48   Bone cancer Maternal Aunt    Lupus Maternal Aunt     Prior to Admission medications  Medication Sig Start Date End Date Taking? Authorizing Provider  amitriptyline  (ELAVIL ) 25 MG tablet Take 25 mg by mouth at bedtime.    [provider]  amLODipine  (NORVASC ) 10 MG tablet TAKE 1 TABLET BY MOUTH EVERY DAY Patient not taking: Reported on 11/04/2024 08/13/21   Mona Vinie BROCKS, MD  aspirin  81 MG tablet Take 81 mg by mouth daily with supper.     [provider]  atorvastatin  (LIPITOR) 40 MG tablet Take 40 mg by mouth at bedtime.  [provider]  baclofen  (LIORESAL ) 10 MG tablet Take 10 mg by mouth 2 (two) times daily as needed for muscle spasms. 01/01/16   [provider]  Cholecalciferol  (VITAMIN D -3 PO) Take 1 capsule by mouth daily.    [provider]  EASY COMFORT LANCETS MISC 4 (four) times daily. for testing 12/14/15   [provider]  fenofibrate  (TRICOR ) 48 MG tablet Take 48 mg by mouth at bedtime.     [provider]  FREESTYLE LITE test strip 4 (four) times daily. for testing 12/14/15   [provider]  glipiZIDE -metformin   (METAGLIP ) 5-500 MG tablet Take 2 tablets by mouth 2 (two) times daily. 12/25/15   [provider]  hydrOXYzine  (ATARAX /VISTARIL ) 25 MG tablet Take 25 mg by mouth at bedtime as needed for anxiety. 08/23/20   [provider]  latanoprost  (XALATAN ) 0.005 % ophthalmic solution Place 1 drop into both eyes at bedtime. 10/10/24   [provider]  levETIRAcetam  (KEPPRA ) 500 MG tablet Take 500 mg by mouth 2 (two) times daily.    [provider]  lisinopril  (ZESTRIL ) 5 MG tablet Take 5 mg by mouth daily.    [provider]  metoprolol  tartrate (LOPRESSOR ) 100 MG tablet Take 100 mg by mouth 2 (two) times daily. 11/08/19   [provider]  nitroGLYCERIN  (NITROSTAT ) 0.4 MG SL tablet Place 1 tablet (0.4 mg total) under the tongue every 5 (five) minutes as needed for chest pain. MAX 3 doses in 15 minutes 10/17/20 01/15/21  Meng, Hao, PA  oxyCODONE  (OXYCONTIN ) 10 mg 12 hr tablet Take 1 tablet (10 mg total) by mouth every 12 (twelve) hours. 11/05/24   Laurita Pillion, MD    Physical Exam: Vitals:   12/27/24 1538 12/27/24 1545 12/27/24 1600 12/27/24 1630  BP:  (!) 212/89 (!) 209/91 (!) 223/84  Pulse:  (!) 106 (!) 112 (!) 104  Resp:  (!) 24 (!) 34   Temp:      TempSrc:      SpO2:      Weight: 60.4 kg     Height: 5' (1.524 m)      Limited physical exam Physical Exam Constitutional:      Appearance: She is obese.  HENT:     Head: Normocephalic.     Mouth/Throat:     Mouth: Mucous membranes are dry.  Eyes:     Pupils: Pupils are equal, round, and reactive to light.  Cardiovascular:     Rate and Rhythm: Regular rhythm. Tachycardia present.  Pulmonary:     Effort: Pulmonary effort is normal.     Breath sounds: Normal breath sounds.  Abdominal:     General: Abdomen is flat.  Musculoskeletal:     Cervical back: Normal range of motion.  Skin:    General: Skin is warm.  Neurological:     General: No focal deficit present.     Mental Status: She is  alert.  Psychiatric:        Mood and Affect: Mood normal.       Nicole Pittman is a 74 y.o. female with medical history significant of COPD chronic back pain on narcotics, seizure disorder, type II diabetes, diabetic gastroparesis, hypertension, hyperlipidemia comes into the emergency room with altered mental status. No family is at bedside. Unable to reach patient's husband Nicole Pittman. Left voicemail. Patient is a very poor historian. She is awake alert oriented to place in person. She is unable to give me any details as to  why she is here in the hospital and what happened. She keeps saying I don't know to majority of the questions.  Altered mental status unclear etiology? Hypertensive urgency -- workup for stroke so far negative nonfocal clinical exam. No infectious source identified -- urine drug screen pending -- continue IV fluids -- PRN IV hydralazine  -- aspirin  -- patient recently had MRI, EEG as part of work up with similar presentation in December 2025.- Will hold off MRI for now. -PT OT and speech language pathology to see patient -- TOC for discharge planning  Malignant hypertension -- will resume home meds -- clinically does not appear to be stroke  History of seizure disorder -- continue Keppra   Chronic tremors -- PRN Vistaril   Type II diabetes -- sliding scale insulin  and PO meds  Hyperlipidemia -- continue statin    Advance Care Planning:   Code Status: Full Code   Consults:   Family Communication: left voicemail for husband Nicole Pittman  Severity of Illness: The appropriate patient status for this patient is OBSERVATION. Observation status is judged to be reasonable and necessary in order to provide the required intensity of service to ensure the patient's safety. The patient's presenting symptoms, physical exam findings, and initial radiographic and laboratory data in the context of their medical condition is felt to place them at decreased risk  for further clinical deterioration. Furthermore, it is anticipated that the patient will be medically stable for discharge from the hospital within 2 midnights of admission.   Author: Leita Blanch, MD 12/27/2024 6:03 PM  For on call review [email or web address].     [1]  Allergies Allergen Reactions   Fentanyl  Itching and Rash    PATCHES   Codeine Hives   Morphine And Codeine Hives   Symbicort [Budesonide-Formoterol Fumarate] Other (See Comments)    Made patient's chest hurt and affected vision (blurred)   Zanaflex [Tizanidine] Palpitations and Other (See Comments)    And worsened insomina   "

## 2024-12-27 NOTE — ED Notes (Signed)
 CCMD called to initiate cardiac monitoring.

## 2024-12-27 NOTE — ED Triage Notes (Signed)
 Pt arrives via [facility] from home after being called out after husband found pt on the floor which he was able to get her off of and reporting AMS. LKW was 2200 last night. Pt was able to follow commands for EMS, tremors that are not pt's baseline and a negative stroke screen. Pt has a Hx of DM. Pt is A&Ox43 during triage.

## 2024-12-28 ENCOUNTER — Inpatient Hospital Stay

## 2024-12-28 ENCOUNTER — Observation Stay

## 2024-12-28 DIAGNOSIS — J9602 Acute respiratory failure with hypercapnia: Secondary | ICD-10-CM | POA: Diagnosis not present

## 2024-12-28 DIAGNOSIS — T450X5A Adverse effect of antiallergic and antiemetic drugs, initial encounter: Secondary | ICD-10-CM

## 2024-12-28 DIAGNOSIS — R4182 Altered mental status, unspecified: Secondary | ICD-10-CM | POA: Diagnosis not present

## 2024-12-28 DIAGNOSIS — R251 Tremor, unspecified: Secondary | ICD-10-CM

## 2024-12-28 DIAGNOSIS — E1122 Type 2 diabetes mellitus with diabetic chronic kidney disease: Secondary | ICD-10-CM | POA: Diagnosis not present

## 2024-12-28 DIAGNOSIS — I160 Hypertensive urgency: Secondary | ICD-10-CM | POA: Diagnosis not present

## 2024-12-28 DIAGNOSIS — G9340 Encephalopathy, unspecified: Secondary | ICD-10-CM | POA: Diagnosis not present

## 2024-12-28 DIAGNOSIS — J9601 Acute respiratory failure with hypoxia: Secondary | ICD-10-CM

## 2024-12-28 DIAGNOSIS — I471 Supraventricular tachycardia, unspecified: Secondary | ICD-10-CM

## 2024-12-28 DIAGNOSIS — N1831 Chronic kidney disease, stage 3a: Secondary | ICD-10-CM

## 2024-12-28 DIAGNOSIS — R29722 NIHSS score 22: Secondary | ICD-10-CM | POA: Diagnosis not present

## 2024-12-28 DIAGNOSIS — E785 Hyperlipidemia, unspecified: Secondary | ICD-10-CM | POA: Diagnosis not present

## 2024-12-28 DIAGNOSIS — J440 Chronic obstructive pulmonary disease with acute lower respiratory infection: Secondary | ICD-10-CM | POA: Diagnosis not present

## 2024-12-28 DIAGNOSIS — R569 Unspecified convulsions: Secondary | ICD-10-CM | POA: Diagnosis not present

## 2024-12-28 DIAGNOSIS — G40909 Epilepsy, unspecified, not intractable, without status epilepticus: Secondary | ICD-10-CM | POA: Diagnosis not present

## 2024-12-28 DIAGNOSIS — G928 Other toxic encephalopathy: Secondary | ICD-10-CM

## 2024-12-28 DIAGNOSIS — I120 Hypertensive chronic kidney disease with stage 5 chronic kidney disease or end stage renal disease: Secondary | ICD-10-CM | POA: Diagnosis not present

## 2024-12-28 DIAGNOSIS — I639 Cerebral infarction, unspecified: Secondary | ICD-10-CM

## 2024-12-28 LAB — VITAMIN B12: Vitamin B-12: 202 pg/mL (ref 180–914)

## 2024-12-28 LAB — BASIC METABOLIC PANEL WITH GFR
Anion gap: 18 — ABNORMAL HIGH (ref 5–15)
BUN: 13 mg/dL (ref 8–23)
CO2: 21 mmol/L — ABNORMAL LOW (ref 22–32)
Calcium: 8.6 mg/dL — ABNORMAL LOW (ref 8.9–10.3)
Chloride: 96 mmol/L — ABNORMAL LOW (ref 98–111)
Creatinine, Ser: 0.86 mg/dL (ref 0.44–1.00)
GFR, Estimated: >60 mL/min
Glucose, Bld: 255 mg/dL — ABNORMAL HIGH (ref 70–99)
Potassium: 3.7 mmol/L (ref 3.5–5.1)
Sodium: 134 mmol/L — ABNORMAL LOW (ref 135–145)

## 2024-12-28 LAB — GLUCOSE, CAPILLARY
Glucose-Capillary: 248 mg/dL — ABNORMAL HIGH (ref 70–99)
Glucose-Capillary: 176 mg/dL — ABNORMAL HIGH (ref 70–99)
Glucose-Capillary: 94 mg/dL (ref 70–99)
Glucose-Capillary: 111 mg/dL — ABNORMAL HIGH (ref 70–99)
Glucose-Capillary: 224 mg/dL — ABNORMAL HIGH (ref 70–99)

## 2024-12-28 LAB — CBC
HCT: 34.1 % — ABNORMAL LOW (ref 36.0–46.0)
Hemoglobin: 11.1 g/dL — ABNORMAL LOW (ref 12.0–15.0)
MCH: 29.1 pg (ref 26.0–34.0)
MCHC: 32.6 g/dL (ref 30.0–36.0)
MCV: 89.3 fL (ref 80.0–100.0)
Platelets: 301 10*3/uL (ref 150–400)
RBC: 3.82 MIL/uL — ABNORMAL LOW (ref 3.87–5.11)
RDW: 13.7 % (ref 11.5–15.5)
WBC: 15.1 10*3/uL — ABNORMAL HIGH (ref 4.0–10.5)
nRBC: 0 % (ref 0.0–0.2)

## 2024-12-28 LAB — BLOOD GAS, ARTERIAL
Acid-base deficit: 4.7 mmol/L — ABNORMAL HIGH (ref 0.0–2.0)
Bicarbonate: 19.5 mmol/L — ABNORMAL LOW (ref 20.0–28.0)
FIO2: 35 %
MECHVT: 400 mL
Mechanical Rate: 18
O2 Saturation: 99.7 %
PEEP: 5 cmH2O
Patient temperature: 37
pCO2 arterial: 33 mmHg (ref 32–48)
pH, Arterial: 7.38 (ref 7.35–7.45)
pO2, Arterial: 103 mmHg (ref 83–108)

## 2024-12-28 LAB — MRSA NEXT GEN BY PCR, NASAL: MRSA by PCR Next Gen: NOT DETECTED

## 2024-12-28 LAB — LACTIC ACID, PLASMA
Lactic Acid, Venous: 2.7 mmol/L (ref 0.5–1.9)
Lactic Acid, Venous: 3.9 mmol/L (ref 0.5–1.9)
Lactic Acid, Venous: 1 mmol/L (ref 0.5–1.9)

## 2024-12-28 LAB — MAGNESIUM
Magnesium: 1.5 mg/dL — ABNORMAL LOW (ref 1.7–2.4)
Magnesium: 2.1 mg/dL (ref 1.7–2.4)

## 2024-12-28 LAB — CBG MONITORING, ED: Glucose-Capillary: 269 mg/dL — ABNORMAL HIGH (ref 70–99)

## 2024-12-28 LAB — PHOSPHORUS: Phosphorus: 3.2 mg/dL (ref 2.5–4.6)

## 2024-12-28 LAB — HIV ANTIBODY (ROUTINE TESTING W REFLEX): HIV Screen 4th Generation wRfx: NONREACTIVE

## 2024-12-28 LAB — PROCALCITONIN: Procalcitonin: 0.11 ng/mL

## 2024-12-28 LAB — TSH: TSH: 0.587 u[IU]/mL (ref 0.350–4.500)

## 2024-12-28 MED ORDER — FENTANYL 2500MCG IN NS 250ML (10MCG/ML) PREMIX INFUSION: 0.0000 ug/h | INTRAVENOUS | Status: DC

## 2024-12-28 MED ORDER — PROSOURCE TF20 ENFIT COMPATIBL EN LIQD
60.0000 mL | Freq: Every day | ENTERAL | Status: DC
  Administered 2024-12-28 – 2024-12-29 (×2): 60 mL
  Filled 2024-12-28 (×2): qty 60

## 2024-12-28 MED ORDER — DEXMEDETOMIDINE HCL IN NACL 400 MCG/100ML IV SOLN
0.0000 ug/kg/h | INTRAVENOUS | Status: DC
  Administered 2024-12-28: 0.5 ug/kg/h via INTRAVENOUS
  Administered 2024-12-28: 0.4 ug/kg/h via INTRAVENOUS
  Administered 2024-12-29: 0.6 ug/kg/h via INTRAVENOUS
  Filled 2024-12-28 (×3): qty 100

## 2024-12-28 MED ORDER — ESMOLOL HCL-SODIUM CHLORIDE 2000 MG/100ML IV SOLN
25.0000 ug/kg/min | INTRAVENOUS | Status: DC
  Filled 2024-12-28: qty 100

## 2024-12-28 MED ORDER — HYDROMORPHONE HCL 1 MG/ML IJ SOLN
1.0000 mg | INTRAMUSCULAR | Status: DC | PRN
  Administered 2024-12-29: 1 mg via INTRAVENOUS
  Filled 2024-12-28: qty 1

## 2024-12-28 MED ORDER — CHLORHEXIDINE GLUCONATE CLOTH 2 % EX PADS
6.0000 | MEDICATED_PAD | Freq: Every day | CUTANEOUS | Status: AC
  Administered 2024-12-28 – 2024-12-30 (×3): 6 via TOPICAL
  Filled 2024-12-28: qty 6

## 2024-12-28 MED ORDER — PROPOFOL 10 MG/ML IV BOLUS
140.0000 mg | Freq: Once | INTRAVENOUS | Status: AC
  Administered 2024-12-28: 140 mg via INTRAVENOUS

## 2024-12-28 MED ORDER — PROPOFOL 1000 MG/100ML IV EMUL
0.0000 ug/kg/min | INTRAVENOUS | Status: DC
  Administered 2024-12-28: 20 ug/kg/min via INTRAVENOUS
  Filled 2024-12-28: qty 100

## 2024-12-28 MED ORDER — HYDROMORPHONE HCL 1 MG/ML IJ SOLN: 1.0000 mg | INTRAMUSCULAR | Status: DC | PRN

## 2024-12-28 MED ORDER — MIDAZOLAM HCL (PF) 2 MG/2ML IJ SOLN: 4.0000 mg | Freq: Once | INTRAMUSCULAR | Status: AC

## 2024-12-28 MED ORDER — SENNA 8.6 MG PO TABS: 1.0000 | ORAL_TABLET | Freq: Two times a day (BID) | ORAL | Status: DC | PRN

## 2024-12-28 MED ORDER — ESMOLOL HCL-SODIUM CHLORIDE 2000 MG/100ML IV SOLN: 25.0000 ug/kg/min | INTRAVENOUS | Status: DC

## 2024-12-28 MED ORDER — ESMOLOL BOLUS VIA INFUSION: 500.0000 ug/kg | Freq: Once | INTRAVENOUS | Status: DC

## 2024-12-28 MED ORDER — PROPOFOL 1000 MG/100ML IV EMUL
INTRAVENOUS | Status: AC
  Administered 2024-12-28: 5 ug/kg/min via INTRAVENOUS
  Filled 2024-12-28: qty 100

## 2024-12-28 MED ORDER — PROPOFOL 10 MG/ML IV BOLUS
INTRAVENOUS | Status: AC
  Filled 2024-12-28: qty 20

## 2024-12-28 MED ORDER — HYDRALAZINE HCL 20 MG/ML IJ SOLN
10.0000 mg | INTRAMUSCULAR | Status: AC | PRN
  Administered 2024-12-29 – 2024-12-31 (×9): 10 mg via INTRAVENOUS
  Filled 2024-12-28 (×10): qty 1

## 2024-12-28 MED ORDER — GADOBUTROL 1 MMOL/ML IV SOLN
6.0000 mL | Freq: Once | INTRAVENOUS | Status: AC | PRN
  Administered 2024-12-28: 6 mL via INTRAVENOUS

## 2024-12-28 MED ORDER — MAGNESIUM SULFATE 2 GM/50ML IV SOLN
2.0000 g | Freq: Every day | INTRAVENOUS | Status: DC
  Administered 2024-12-28: 2 g via INTRAVENOUS
  Filled 2024-12-28 (×2): qty 50

## 2024-12-28 MED ORDER — SENNA 8.6 MG PO TABS
1.0000 | ORAL_TABLET | Freq: Two times a day (BID) | ORAL | Status: DC
  Administered 2024-12-28 – 2024-12-29 (×3): 8.6 mg
  Filled 2024-12-28 (×3): qty 1

## 2024-12-28 MED ORDER — ROCURONIUM BROMIDE 10 MG/ML (PF) SYRINGE
PREFILLED_SYRINGE | INTRAVENOUS | Status: AC
  Administered 2024-12-28: 70 mg via INTRAVENOUS
  Filled 2024-12-28: qty 10

## 2024-12-28 MED ORDER — LORAZEPAM 2 MG/ML IJ SOLN: 2.0000 mg | INTRAMUSCULAR | Status: AC

## 2024-12-28 MED ORDER — INSULIN ASPART 100 UNIT/ML IJ SOLN
0.0000 [IU] | INTRAMUSCULAR | Status: AC
  Administered 2024-12-28: 3 [IU] via SUBCUTANEOUS
  Administered 2024-12-28 (×2): 5 [IU] via SUBCUTANEOUS
  Administered 2024-12-29: 8 [IU] via SUBCUTANEOUS
  Administered 2024-12-29: 2 [IU] via SUBCUTANEOUS
  Administered 2024-12-29: 3 [IU] via SUBCUTANEOUS
  Administered 2024-12-29: 5 [IU] via SUBCUTANEOUS
  Administered 2024-12-30 (×6): 2 [IU] via SUBCUTANEOUS
  Administered 2024-12-31: 3 [IU] via SUBCUTANEOUS
  Administered 2024-12-31: 2 [IU] via SUBCUTANEOUS
  Administered 2024-12-31: 5 [IU] via SUBCUTANEOUS
  Administered 2024-12-31: 11 [IU] via SUBCUTANEOUS
  Filled 2024-12-28 (×2): qty 2
  Filled 2024-12-28 (×2): qty 5
  Filled 2024-12-28 (×2): qty 2
  Filled 2024-12-28: qty 3
  Filled 2024-12-28 (×2): qty 2
  Filled 2024-12-28: qty 11
  Filled 2024-12-28: qty 8
  Filled 2024-12-28: qty 3
  Filled 2024-12-28: qty 5
  Filled 2024-12-28 (×2): qty 2
  Filled 2024-12-28: qty 3
  Filled 2024-12-28: qty 5

## 2024-12-28 MED ORDER — FAMOTIDINE 20 MG PO TABS
20.0000 mg | ORAL_TABLET | Freq: Two times a day (BID) | ORAL | Status: DC
  Administered 2024-12-28 – 2024-12-29 (×3): 20 mg
  Filled 2024-12-28 (×3): qty 1

## 2024-12-28 MED ORDER — METOPROLOL TARTRATE 50 MG PO TABS
50.0000 mg | ORAL_TABLET | Freq: Two times a day (BID) | ORAL | Status: DC
  Administered 2024-12-28: 50 mg
  Filled 2024-12-28 (×2): qty 1

## 2024-12-28 MED ORDER — LEVETIRACETAM (KEPPRA) 500 MG/5 ML ADULT IV PUSH
1000.0000 mg | Freq: Once | INTRAVENOUS | Status: AC
  Administered 2024-12-28: 1000 mg via INTRAVENOUS
  Filled 2024-12-28: qty 10

## 2024-12-28 MED ORDER — POLYETHYLENE GLYCOL 3350 17 G PO PACK: 17.0000 g | PACK | Freq: Every day | ORAL | Status: DC | PRN

## 2024-12-28 MED ORDER — LEVETIRACETAM 500 MG PO TABS
500.0000 mg | ORAL_TABLET | Freq: Two times a day (BID) | ORAL | Status: DC
  Administered 2024-12-28 – 2024-12-29 (×3): 500 mg
  Filled 2024-12-28 (×3): qty 1

## 2024-12-28 MED ORDER — FENOFIBRATE 54 MG PO TABS
54.0000 mg | ORAL_TABLET | Freq: Every day | ORAL | Status: DC
  Administered 2024-12-28 – 2024-12-29 (×2): 54 mg
  Filled 2024-12-28 (×4): qty 1

## 2024-12-28 MED ORDER — OXYCODONE HCL 5 MG PO TABS: 5.0000 mg | ORAL_TABLET | Freq: Two times a day (BID) | ORAL | Status: DC

## 2024-12-28 MED ORDER — FREE WATER
30.0000 mL | Status: DC
  Administered 2024-12-28 – 2024-12-29 (×6): 30 mL

## 2024-12-28 MED ORDER — PROPOFOL 1000 MG/100ML IV EMUL
0.0000 ug/kg/min | INTRAVENOUS | Status: DC
  Administered 2024-12-28: 20 ug/kg/min via INTRAVENOUS

## 2024-12-28 MED ORDER — ROCURONIUM BROMIDE 10 MG/ML (PF) SYRINGE: 70.0000 mg | PREFILLED_SYRINGE | Freq: Once | INTRAVENOUS | Status: AC

## 2024-12-28 MED ORDER — POLYETHYLENE GLYCOL 3350 17 G PO PACK
17.0000 g | PACK | Freq: Every day | ORAL | Status: DC
  Administered 2024-12-28 – 2024-12-29 (×2): 17 g
  Filled 2024-12-28 (×2): qty 1

## 2024-12-28 MED ORDER — NOREPINEPHRINE 4 MG/250ML-% IV SOLN
0.0000 ug/min | INTRAVENOUS | Status: DC
  Administered 2024-12-29: 2 ug/min via INTRAVENOUS
  Filled 2024-12-28: qty 250

## 2024-12-28 MED ORDER — NICARDIPINE HCL IN NACL 20-0.86 MG/200ML-% IV SOLN
0.0000 mg/h | INTRAVENOUS | Status: DC
  Administered 2024-12-28: 5 mg/h via INTRAVENOUS
  Filled 2024-12-28 (×2): qty 200

## 2024-12-28 MED ORDER — OXYCODONE HCL 5 MG PO TABS
5.0000 mg | ORAL_TABLET | Freq: Four times a day (QID) | ORAL | Status: DC
  Administered 2024-12-28 – 2024-12-29 (×6): 5 mg
  Filled 2024-12-28 (×6): qty 1

## 2024-12-28 MED ORDER — VITAL 1.5 CAL PO LIQD
1000.0000 mL | ORAL | Status: DC
  Administered 2024-12-28 – 2024-12-29 (×2): 1000 mL

## 2024-12-28 MED ORDER — FENTANYL 2500MCG IN NS 250ML (10MCG/ML) PREMIX INFUSION
INTRAVENOUS | Status: AC
  Filled 2024-12-28: qty 250

## 2024-12-28 MED ORDER — LORAZEPAM 2 MG/ML IJ SOLN
INTRAMUSCULAR | Status: AC
  Administered 2024-12-28: 2 mg via INTRAVENOUS
  Filled 2024-12-28: qty 1

## 2024-12-28 MED ORDER — ESMOLOL BOLUS VIA INFUSION
500.0000 ug/kg | Freq: Once | INTRAVENOUS | Status: DC
  Filled 2024-12-28: qty 23000

## 2024-12-28 MED ORDER — ASPIRIN 81 MG PO CHEW
81.0000 mg | CHEWABLE_TABLET | Freq: Every day | ORAL | Status: DC
  Administered 2024-12-28 – 2024-12-29 (×2): 81 mg
  Filled 2024-12-28 (×2): qty 1

## 2024-12-28 MED ORDER — SODIUM CHLORIDE 0.9 % IV SOLN
3.0000 g | Freq: Four times a day (QID) | INTRAVENOUS | Status: DC
  Administered 2024-12-28 – 2024-12-29 (×5): 3 g via INTRAVENOUS
  Filled 2024-12-28 (×7): qty 8

## 2024-12-28 MED ORDER — MIDAZOLAM HCL 2 MG/2ML IJ SOLN
INTRAMUSCULAR | Status: AC
  Administered 2024-12-28: 4 mg via INTRAVENOUS
  Filled 2024-12-28: qty 4

## 2024-12-28 MED ORDER — AMLODIPINE BESYLATE 10 MG PO TABS
10.0000 mg | ORAL_TABLET | Freq: Every day | ORAL | Status: DC
  Administered 2024-12-28: 10 mg
  Filled 2024-12-28: qty 1

## 2024-12-28 MED ORDER — SODIUM CHLORIDE 0.9 % IV SOLN: 250.0000 mL | INTRAVENOUS | Status: AC

## 2024-12-28 MED ORDER — ATORVASTATIN CALCIUM 20 MG PO TABS
40.0000 mg | ORAL_TABLET | Freq: Every day | ORAL | Status: DC
  Administered 2024-12-28: 40 mg
  Filled 2024-12-28: qty 2

## 2024-12-28 MED ORDER — LACTATED RINGERS IV BOLUS
1000.0000 mL | Freq: Once | INTRAVENOUS | Status: AC
  Administered 2024-12-28: 1000 mL via INTRAVENOUS

## 2024-12-28 MED ORDER — LEVETIRACETAM (KEPPRA) 500 MG/5 ML ADULT IV PUSH
500.0000 mg | Freq: Once | INTRAVENOUS | Status: AC
  Administered 2024-12-28: 500 mg via INTRAVENOUS
  Filled 2024-12-28: qty 5

## 2024-12-28 MED ORDER — SODIUM CHLORIDE 0.9 % IV BOLUS
500.0000 mL | INTRAVENOUS | Status: AC
  Administered 2024-12-28: 500 mL via INTRAVENOUS

## 2024-12-28 MED ORDER — METOPROLOL TARTRATE 5 MG/5ML IV SOLN
5.0000 mg | Freq: Four times a day (QID) | INTRAVENOUS | Status: DC | PRN
  Administered 2024-12-29 (×2): 5 mg via INTRAVENOUS
  Filled 2024-12-28 (×3): qty 5

## 2024-12-28 NOTE — ED Notes (Signed)
 Mouth care performed. Pt not responding verbally.

## 2024-12-28 NOTE — ED Provider Notes (Signed)
 4:18 AM  ED staff called to bedside due to change in patient's vital signs on cardiac monitoring.  On entering the room patient with sonorous like respirations, altered mental status, right gaze preference, tachycardia, hypertension.  On chart review it appears she has a history of seizures on Keppra  500 twice daily.  Dr. Jossie at bedside to intubate patient and continue managing her.  Rapid response was called and hospitalist and ICU team at bedside as well.  Given 2mg  IV Ativan  for concerns for possible unwitnessed seizure given her current presentation.  Blood glucose currently 269.   I helped to enter orders for the patient including IV Keppra  load, Keppra  level, CBG, repeat EKG, repeat head CT, chest and abdominal x-rays after intubation and OG tube placement, medications for RSI for intubation and sedation while on the ventilator.  Please see hospitalist, ICU and Dr. Annmarie notes for further information.   Nicole Pittman, [name], DO 12/28/24 [phone]

## 2024-12-28 NOTE — Progress Notes (Signed)
 Eeg done

## 2024-12-28 NOTE — Plan of Care (Signed)
" °  Problem: Coping: Goal: Ability to adjust to condition or change in health will improve Outcome: Progressing   Problem: Fluid Volume: Goal: Ability to maintain a balanced intake and output will improve Outcome: Progressing   Problem: Metabolic: Goal: Ability to maintain appropriate glucose levels will improve Outcome: Progressing   Problem: Nutritional: Goal: Maintenance of adequate nutrition will improve Outcome: Progressing Goal: Progress toward achieving an optimal weight will improve Outcome: Progressing   Problem: Tissue Perfusion: Goal: Adequacy of tissue perfusion will improve Outcome: Progressing   Problem: Education: Goal: Knowledge of General Education information will improve Description: Including pain rating scale, medication(s)/side effects and non-pharmacologic comfort measures Outcome: Progressing   Problem: Clinical Measurements: Goal: Will remain free from infection Outcome: Progressing Goal: Respiratory complications will improve Outcome: Progressing   Problem: Nutrition: Goal: Adequate nutrition will be maintained Outcome: Progressing   Problem: Pain Managment: Goal: General experience of comfort will improve and/or be controlled Outcome: Progressing   Problem: Safety: Goal: Ability to remain free from injury will improve Outcome: Progressing   "

## 2024-12-28 NOTE — ED Notes (Signed)
 Responded to CCMD stating pts SpO2 had dropped. Pt presenting with agonal respirations and not responsive to sternal rub. ED docs notified and secretary notified to page floor coverage and call rapid response.

## 2024-12-28 NOTE — Progress Notes (Signed)
 RT assisted with patient transport to MRI and back to ICU with no complications. Pt was manually ventilated to MRI and placed on MRI Servo I vent and then manually ventilated back to ICU.  Approx time with patient: 30 min

## 2024-12-28 NOTE — Progress Notes (Signed)
 Initial Nutrition Assessment  DOCUMENTATION CODES:  Not applicable  INTERVENTION:  Initiate tube feeding via OGT: Vital 1.5 at 40 ml/h (960 ml per day) Start at 20 and advance by 10mL every 12 hours to reach goal Prosource TF20 60 ml 1x/d Free water : 30mL every 4 hours Provides 1520 kcal, 85 gm protein, 733 ml free water  daily ( free water , TF+flush)  NUTRITION DIAGNOSIS:  Inadequate oral intake related to inability to eat as evidenced by NPO status.  GOAL:  Patient will meet greater than or equal to 90% of their needs  MONITOR:  TF tolerance, Vent status, Labs  REASON FOR ASSESSMENT:  Ventilator, Consult Enteral/tube feeding initiation and management  ASSESSMENT:  Pt with hx of HTN, HLD, DM type 2 with gastroparesis, seizure disorder, CKD3, and COPD presented to ED with AMS.  2/2 - presented to ED 2/3 - intubated, starting feeds   RD working remotely  Patient is currently intubated on ventilator support. Unable to obtain a nutrition hx. MD ok with initiation of enteral feeds.   Etiology of AMS still under evaluation. Neurology ordered EEG and recommended that pt be transferred to [facility] for continuous EEG if test showing signs of seizure activity. Will follow-up as planned for monitoring tolerance to TF and to obtain nutrition hx and physical exam.    MV: 8.8 L/min Temp (24hrs), Avg:99.4 F (37.4 C), Min:97.9 F (36.6 C), Max:100.9 F (38.3 C) MAP (cuff): 76-33mmHg this AM  Propofol : 5.44 ml/hr (144 kcal/d)  Admit weight: 60.4 kg  Current weight: 58.2 kg    Intake/Output Summary (Last 24 hours) at 12/28/2024 1327 Last data filed at 12/28/2024 1254 Gross per 24 hour  Intake 3168.35 ml  Output 775 ml  Net 2393.35 ml  Net IO Since Admission: 2,393.35 mL [12/28/24 1327]  Drains/Lines: OGT 18 Fr.  UOP x 24 hours  Nutritionally Relevant Medications: Scheduled Meds:  atorvastatin   40 mg Per Tube QHS   famotidine   20 mg Per Tube BID   fenofibrate    54 mg Per Tube Daily   insulin  aspart  0-15 Units Subcutaneous Q4H   levETIRAcetam   500 mg Per Tube BID   polyethylene glycol  17 g Per Tube Daily   senna  1 tablet Per Tube BID   Continuous Infusions:  ampicillin -sulbactam (UNASYN ) IV Stopped (12/28/24 9386)   magnesium  sulfate bolus IVPB     propofol  (DIPRIVAN ) infusion 15 mcg/kg/min (12/28/24 0657)   PRN Meds: ondansetron , polyethylene glycol, senna  Labs Reviewed: Sodium 134, chloride 96 Magnesium  1.5 CBG ranges from 166-269 mg/dL over the last 24 hours HgbA1c 6.4%  NUTRITION - FOCUSED PHYSICAL EXAM: Defer to in-person assessment  Diet Order:   Diet Order             Diet NPO time specified  Diet effective now                   EDUCATION NEEDS:  Not appropriate for education at this time  Skin:  Skin Assessment: Reviewed RN Assessment Stage 1: - medial sacrum (3 x 6 cm)  Last BM:  unsure  Height:  Ht Readings from Last 1 Encounters:  12/27/24 5' (1.524 m)    Weight:  Wt Readings from Last 1 Encounters:  12/28/24 58.2 kg    Ideal Body Weight:  45.5 kg  BMI:  Body mass index is 25.06 kg/m.  Estimated Nutritional Needs:  Kcal:  1400-1600 kcal/d Protein:  70-90g/d Fluid:  >/= 1.5L/d    Nicole Pittman, RD, LDN, CNSC Registered Dietitian II Please reach out via secure chat

## 2024-12-28 NOTE — Procedures (Signed)
 Patient Name: TWILLA KHOURI  MRN: [id number]  Epilepsy Attending: Arlin MALVA Krebs  Referring Physician/Provider: Voncile Isles, MD  Date:12/28/2024 Duration: 31.44 mins  Patient history: 74yo F with ams. EEG to evaluate for seizure  Level of alertness: comatose/ lethargic   AEDs during EEG study: Ativan   Technical aspects: This EEG study was done with scalp electrodes positioned according to the 10-20 International system of electrode placement. Electrical activity was reviewed with band pass filter of 1-70Hz , sensitivity of 7 uV/mm, display speed of 62mm/sec with a 60Hz  notched filter applied as appropriate. EEG data were recorded continuously and digitally stored.  Video monitoring was available and reviewed as appropriate.  Description: EEG showed continuous 5 to 7 Hz theta slowing in right hemisphere and low amplitude 3-5hz  theta-delta slowing in left hemisphere. Hyperventilation and photic stimulation were not performed.     ABNORMALITY - Continuous slow, generalized and lateralized left hemisphere  IMPRESSION: This study is suggestive of cortical dysfunction arising from left hemisphere likely secondary to underlying structural abnormality.  Additionally there is generalized cerebral dysfunction (encephalopathy). No seizures or definite epileptiform discharges were seen throughout the recording.  Kenedy Haisley O Bernadett Milian

## 2024-12-28 NOTE — Progress Notes (Signed)
 PT Cancellation Note  Patient Details Name: Nicole Pittman MRN: [id number] DOB: [birth year]   Cancelled Treatment:    Reason Eval/Treat Not Completed: Other (comment). Pt transferred to ICU, intubated. Per MD PT to sign off, will reconsult when pt is medically appropriate.   Doyal Shams PT, DPT 10:29 AM,12/28/24

## 2024-12-29 ENCOUNTER — Inpatient Hospital Stay

## 2024-12-29 DIAGNOSIS — R4182 Altered mental status, unspecified: Secondary | ICD-10-CM | POA: Diagnosis not present

## 2024-12-29 DIAGNOSIS — T450X5A Adverse effect of antiallergic and antiemetic drugs, initial encounter: Secondary | ICD-10-CM | POA: Diagnosis not present

## 2024-12-29 DIAGNOSIS — E119 Type 2 diabetes mellitus without complications: Secondary | ICD-10-CM

## 2024-12-29 DIAGNOSIS — J449 Chronic obstructive pulmonary disease, unspecified: Secondary | ICD-10-CM

## 2024-12-29 DIAGNOSIS — G40909 Epilepsy, unspecified, not intractable, without status epilepticus: Secondary | ICD-10-CM | POA: Diagnosis not present

## 2024-12-29 DIAGNOSIS — I639 Cerebral infarction, unspecified: Secondary | ICD-10-CM | POA: Diagnosis not present

## 2024-12-29 DIAGNOSIS — G928 Other toxic encephalopathy: Secondary | ICD-10-CM | POA: Diagnosis not present

## 2024-12-29 DIAGNOSIS — R569 Unspecified convulsions: Secondary | ICD-10-CM | POA: Diagnosis not present

## 2024-12-29 LAB — RESPIRATORY PANEL BY PCR

## 2024-12-29 LAB — RENAL FUNCTION PANEL
Albumin: 4 g/dL (ref 3.5–5.0)
Anion gap: 20 — ABNORMAL HIGH (ref 5–15)
BUN: 14 mg/dL (ref 8–23)
CO2: 22 mmol/L (ref 22–32)
Calcium: 8.4 mg/dL — ABNORMAL LOW (ref 8.9–10.3)
Chloride: 91 mmol/L — ABNORMAL LOW (ref 98–111)
Creatinine, Ser: 0.8 mg/dL (ref 0.44–1.00)
GFR, Estimated: >60 mL/min
Glucose, Bld: 217 mg/dL — ABNORMAL HIGH (ref 70–99)
Phosphorus: 1.7 mg/dL — ABNORMAL LOW (ref 2.5–4.6)
Potassium: 3.2 mmol/L — ABNORMAL LOW (ref 3.5–5.1)
Sodium: 132 mmol/L — ABNORMAL LOW (ref 135–145)

## 2024-12-29 LAB — PHOSPHORUS
Phosphorus: 1.7 mg/dL — ABNORMAL LOW (ref 2.5–4.6)
Phosphorus: 3.4 mg/dL (ref 2.5–4.6)

## 2024-12-29 LAB — SYPHILIS: RPR W/REFLEX TO RPR TITER AND TREPONEMAL ANTIBODIES, TRADITIONAL SCREENING AND DIAGNOSIS ALGORITHM: RPR Ser Ql: NONREACTIVE

## 2024-12-29 LAB — CBC
HCT: 32.6 % — ABNORMAL LOW (ref 36.0–46.0)
Hemoglobin: 10.8 g/dL — ABNORMAL LOW (ref 12.0–15.0)
MCH: 29.8 pg (ref 26.0–34.0)
MCHC: 33.1 g/dL (ref 30.0–36.0)
MCV: 89.8 fL (ref 80.0–100.0)
Platelets: 244 10*3/uL (ref 150–400)
RBC: 3.63 MIL/uL — ABNORMAL LOW (ref 3.87–5.11)
RDW: 13.6 % (ref 11.5–15.5)
WBC: 9.9 10*3/uL (ref 4.0–10.5)
nRBC: 0 % (ref 0.0–0.2)

## 2024-12-29 LAB — GLUCOSE, CAPILLARY
Glucose-Capillary: 108 mg/dL — ABNORMAL HIGH (ref 70–99)
Glucose-Capillary: 112 mg/dL — ABNORMAL HIGH (ref 70–99)
Glucose-Capillary: 128 mg/dL — ABNORMAL HIGH (ref 70–99)
Glucose-Capillary: 139 mg/dL — ABNORMAL HIGH (ref 70–99)
Glucose-Capillary: 164 mg/dL — ABNORMAL HIGH (ref 70–99)
Glucose-Capillary: 232 mg/dL — ABNORMAL HIGH (ref 70–99)
Glucose-Capillary: 259 mg/dL — ABNORMAL HIGH (ref 70–99)

## 2024-12-29 LAB — MAGNESIUM
Magnesium: 1.6 mg/dL — ABNORMAL LOW (ref 1.7–2.4)
Magnesium: 2.6 mg/dL — ABNORMAL HIGH (ref 1.7–2.4)

## 2024-12-29 LAB — PROCALCITONIN: Procalcitonin: 0.12 ng/mL

## 2024-12-29 LAB — LEVETIRACETAM LEVEL: Levetiracetam Lvl: 43.8 ug/mL — ABNORMAL HIGH (ref 10.0–40.0)

## 2024-12-29 LAB — POTASSIUM: Potassium: 2.9 mmol/L — ABNORMAL LOW (ref 3.5–5.1)

## 2024-12-29 LAB — TRIGLYCERIDES: Triglycerides: 123 mg/dL

## 2024-12-29 MED ORDER — HYDRALAZINE HCL 20 MG/ML IJ SOLN
5.0000 mg | Freq: Once | INTRAMUSCULAR | Status: AC
  Administered 2024-12-29: 5 mg via INTRAVENOUS
  Filled 2024-12-29: qty 1

## 2024-12-29 MED ORDER — POTASSIUM & SODIUM PHOSPHATES 280-160-250 MG PO PACK
2.0000 | PACK | ORAL | Status: AC
  Administered 2024-12-29 (×3): 2
  Filled 2024-12-29 (×3): qty 2

## 2024-12-29 MED ORDER — ACETAMINOPHEN 325 MG PO TABS: 650.0000 mg | ORAL_TABLET | Freq: Four times a day (QID) | ORAL | Status: DC | PRN

## 2024-12-29 MED ORDER — LABETALOL HCL 5 MG/ML IV SOLN
10.0000 mg | INTRAVENOUS | Status: DC | PRN
  Administered 2024-12-29: 10 mg via INTRAVENOUS
  Filled 2024-12-29: qty 4

## 2024-12-29 MED ORDER — HYDROMORPHONE HCL 1 MG/ML IJ SOLN
0.5000 mg | Freq: Once | INTRAMUSCULAR | Status: AC
  Administered 2024-12-29: 0.5 mg via INTRAVENOUS
  Filled 2024-12-29: qty 1

## 2024-12-29 MED ORDER — ACETAMINOPHEN 650 MG RE SUPP: 650.0000 mg | Freq: Four times a day (QID) | RECTAL | Status: DC | PRN

## 2024-12-29 MED ORDER — LABETALOL HCL 5 MG/ML IV SOLN
10.0000 mg | Freq: Once | INTRAVENOUS | Status: AC
  Administered 2024-12-29: 10 mg via INTRAVENOUS
  Filled 2024-12-29: qty 4

## 2024-12-29 MED ORDER — MAGNESIUM SULFATE 2 GM/50ML IV SOLN: 2.0000 g | Freq: Once | INTRAVENOUS | Status: DC

## 2024-12-29 MED ORDER — HYDROMORPHONE HCL 1 MG/ML IJ SOLN
0.5000 mg | INTRAMUSCULAR | Status: DC
  Administered 2024-12-30 (×3): 0.5 mg via INTRAVENOUS
  Filled 2024-12-29 (×3): qty 1

## 2024-12-29 MED ORDER — SODIUM CHLORIDE 0.9 % IV SOLN
2.0000 g | INTRAVENOUS | Status: DC
  Filled 2024-12-29 (×2): qty 2000

## 2024-12-29 MED ORDER — POTASSIUM CHLORIDE 10 MEQ/100ML IV SOLN
10.0000 meq | INTRAVENOUS | Status: AC
  Administered 2024-12-29 (×4): 10 meq via INTRAVENOUS
  Filled 2024-12-29 (×4): qty 100

## 2024-12-29 MED ORDER — LABETALOL HCL 5 MG/ML IV SOLN
10.0000 mg | INTRAVENOUS | Status: AC | PRN
  Administered 2024-12-30 – 2024-12-31 (×4): 20 mg via INTRAVENOUS
  Filled 2024-12-29 (×4): qty 4

## 2024-12-29 MED ORDER — MAGNESIUM SULFATE 4 GM/100ML IV SOLN
4.0000 g | Freq: Once | INTRAVENOUS | Status: AC
  Administered 2024-12-29: 4 g via INTRAVENOUS
  Filled 2024-12-29: qty 100

## 2024-12-29 MED ORDER — ONDANSETRON HCL 4 MG/2ML IJ SOLN: 4.0000 mg | Freq: Four times a day (QID) | INTRAMUSCULAR | Status: DC | PRN

## 2024-12-29 MED ORDER — ONDANSETRON HCL 4 MG PO TABS: 4.0000 mg | ORAL_TABLET | Freq: Four times a day (QID) | ORAL | Status: DC | PRN

## 2024-12-29 MED ORDER — SODIUM CHLORIDE 0.9 % IV SOLN
2.0000 g | Freq: Two times a day (BID) | INTRAVENOUS | Status: DC
  Filled 2024-12-29: qty 20

## 2024-12-29 MED ORDER — LEVETIRACETAM (KEPPRA) 500 MG/5 ML ADULT IV PUSH
500.0000 mg | Freq: Two times a day (BID) | INTRAVENOUS | Status: DC
  Administered 2024-12-29 – 2024-12-31 (×4): 500 mg via INTRAVENOUS
  Filled 2024-12-29 (×5): qty 5

## 2024-12-29 NOTE — Progress Notes (Addendum)
 NEUROLOGY CONSULT FOLLOW UP NOTE   Interval Hx/subjective  Seen and examined in the ICU.  Off of propofol , currently on low-dose Precedex .  No acute changes overnight.  Does not still follow any commands per the care team.  Remains intubated  Vitals   Vitals:   12/29/24 0730 12/29/24 0800 12/29/24 0834 12/29/24 0845  BP: (!) 109/37 (!) 107/46    Pulse: 72 68    Resp: 18 19    Temp:  100.3 F (37.9 C)  99.1 F (37.3 C)  TempSrc:  Axillary  Axillary  SpO2: 96% 97% 98%   Weight:      Height:         Body mass index is 25.06 kg/m.  Physical Exam  General: Sedated with propofol , intubated. HEENT: Normocephalic atraumatic Lungs: Vented Cardiovascular: Irregularly irregular Neurological exam On Precedex  Intubated While she was sleeping, there was no tremulousness but when I stimulated her, she started having some tremors more on the right upper extremity than left. Did not follow any commands Like yesterday, attempted to open eyes to noxious stimulation Cranial nerves: Pupils equal round react light, gaze was midline, she is breathing over the ventilator, brainstem reflexes present.  Does not drink aside from either side.  Facial symmetry difficult to ascertain due to her intubation. Motor examination with cogwheeling worse on the right than left.  To noxious stimulation, localizes on the upper extremities and minimal withdrawal in the lower extremities.  Did not wiggle toes or move arms to command. Sensation examination: As above Coordination difficult to assess given her mental status  Medications Current Medications[1]  Labs and Diagnostic Imaging   CBC:  Recent Labs  Lab 12/27/24 1540 12/27/24 2056 12/28/24 0609 12/29/24 0325  WBC 10.7*   < > 15.1* 9.9  NEUTROABS 7.6  --   --   --   HGB 12.7   < > 11.1* 10.8*  HCT 38.7   < > 34.1* 32.6*  MCV 88.6   < > 89.3 89.8  PLT 344   < > 301 244   < > = values in this interval not displayed.    Basic Metabolic Panel:   Lab Results  Component Value Date   NA 132 (L) 12/29/2024   K 3.2 (L) 12/29/2024   CO2 22 12/29/2024   GLUCOSE 217 (H) 12/29/2024   BUN 14 12/29/2024   CREATININE 0.80 12/29/2024   CALCIUM  8.4 (L) 12/29/2024   GFRNONAA >60 12/29/2024   GFRAA >60 01/24/2020   Lipid Panel:  Lab Results  Component Value Date   LDLCALC 61 01/24/2020   HgbA1c:  Lab Results  Component Value Date   HGBA1C 6.4 (H) 11/03/2024   Urine Drug Screen:     Component Value Date/Time   LABOPIA NEGATIVE 12/27/2024 1540   COCAINSCRNUR NEGATIVE 12/27/2024 1540   COCAINSCRNUR NONE DETECTED 01/23/2020 1200   LABBENZ NEGATIVE 12/27/2024 1540   AMPHETMU NEGATIVE 12/27/2024 1540   THCU NEGATIVE 12/27/2024 1540   LABBARB NEGATIVE 12/27/2024 1540    Imaging personally reviewed CT head: No acute findings MRI brain with and without contrast-on the MRI brain without contrast there was a concern for a newly identified lesion in the left cerebellum but contrast imaging does not reveal any evidence of enhancement.  Neurodiagnostics: EEG showed continuous generalized slowing and lateralized left hemispheric slowing  Prior EEG from December had intermittent generalized slowing..  Other labs Thiamine pending B12 202 TSH 0.587 RPR nonreactive  Assessment   Nicole Pittman is a  74 y.o. female past history of chronic back pain on opiates, COPD, seizure disorder, diabetes, diabetic gastroparesis, hypertension, hyperlipidemia presented for evaluation of altered mental status and concern for seizure-like activity due to having been found less responsive with sonorous breathing and rightward  gaze while in the hospital.  Unclear if she is compliant to medications at home. Has a history of baseline tremors -Has been on Reglan , may be related to medications Brain MRI concerning for a new cerebellar lesion which on contrast imaging shows no enhancement.  Likely an interval infarct, less likely to be infectious or  inflammatory. EEG shows left-sided slowing with no clear structural cause.  Impression Evaluate for seizure-no evidence of ongoing seizure activity clinically or on EEG but there is lateralized left-sided slowing Evaluate for toxic metabolic encephalopathy-has low B12 levels from neurological standards might be contributing Tremulousness-has had tremors in the past which have been attributed to possible medication side effect such as Reglan  Evaluate for stroke-no evidence of acute infarction, or abnormal enhancement on the MRI of the brain with and without contrast  Recommendations  There is not a clear cause of her current altered mental status. I would repeat an EEG today and if she continues to still show left-sided slowing, I would give her a dose of Ativan  and also increase her antiepileptics.  I would then also consider transfer to [facility] for continuous EEG. Replete B12 parenterally-goal level should be greater than 400 There is no clear source of infection or leukocytosis or fevers-low suspicion for this being a primary CNS infection hence I am not inclined to do an LP. Will follow EEG and update recommendations  Plan relayed to PCCM attending Dr. ROEL Barn  ______________________________________________________________________   Signed, Eligio Lav, MD [facility]     CRITICAL CARE ATTESTATION Performed by: Eligio Lav, MD Total critical care time: 37 minutes Critical care time was exclusive of separately billable procedures and treating other patients and/or supervising APPs/Residents/Students Critical care was necessary to treat or prevent imminent or life-threatening deterioration. This patient is critically ill and at significant risk for neurological worsening and/or death and care requires constant monitoring. Critical care was time spent personally by me on the following activities: development of treatment plan with patient and/or surrogate  as well as nursing, discussions with consultants, evaluation of patient's response to treatment, examination of patient, obtaining history from patient or surrogate, ordering and performing treatments and interventions, ordering and review of laboratory studies, ordering and review of radiographic studies, pulse oximetry, re-evaluation of patient's condition, participation in multidisciplinary rounds and medical decision making of high complexity in the care of this patient.     [1]  Current Facility-Administered Medications:    0.9 %  sodium chloride  infusion, 250 mL, Intravenous, Continuous, Shellia Inge BIRCH, NP, Held at 12/28/24 1946   acetaminophen  (TYLENOL ) tablet 650 mg, 650 mg, Per Tube, Q6H PRN **OR** acetaminophen  (TYLENOL ) suppository 650 mg, 650 mg, Rectal, Q6H PRN, Belue, Rankin RAMAN, RPH   Ampicillin -Sulbactam (UNASYN ) 3 g in sodium chloride  0.9 % 100 mL IVPB, 3 g, Intravenous, Q6H, Belue, Rankin RAMAN, RPH, Stopped at 12/29/24 0554   aspirin  chewable tablet 81 mg, 81 mg, Per Tube, Daily, Clair Marolyn NOVAK, RPH, 81 mg at 12/29/24 9161   atorvastatin  (LIPITOR) tablet 40 mg, 40 mg, Per Tube, QHS, Rust-Chester, Britton L, NP, 40 mg at 12/28/24 2210   Chlorhexidine  Gluconate Cloth 2 % PADS 6 each, 6 each, Topical, Daily, Rust-Chester, Jenita CROME, NP, 6 each at 12/29/24 [phone]  dexmedetomidine  (PRECEDEX ) 400 MCG/100ML (4 mcg/mL) infusion, 0-1.2 mcg/kg/hr, Intravenous, Titrated, Shellia Mann D, NP, Last Rate: 8.73 mL/hr at 12/29/24 0836, 0.6 mcg/kg/hr at 12/29/24 0836   enoxaparin  (LOVENOX ) injection 40 mg, 40 mg, Subcutaneous, Q24H, Patel, Sona, MD, 40 mg at 12/28/24 2213   famotidine  (PEPCID ) tablet 20 mg, 20 mg, Per Tube, BID, Rust-Chester, Jenita CROME, NP, 20 mg at 12/29/24 0838   feeding supplement (PROSource TF20) liquid 60 mL, 60 mL, Per Tube, Daily, Assaker, Darrin, MD, 60 mL at 12/29/24 0838   feeding supplement (VITAL 1.5 CAL) liquid 1,000 mL, 1,000 mL, Per Tube, Continuous, Assaker,  Darrin, MD, Last Rate: 30 mL/hr at 12/29/24 0655, Infusion Verify at 12/29/24 0655   fenofibrate  tablet 54 mg, 54 mg, Per Tube, Daily, Rust-Chester, Jenita CROME, NP, 54 mg at 12/29/24 9094   free water  30 mL, 30 mL, Per Tube, Q4H, Assaker, Jean-Pierre, MD, 30 mL at 12/29/24 0800   hydrALAZINE  (APRESOLINE ) injection 10 mg, 10 mg, Intravenous, Q4H PRN, Assaker, Darrin, MD, 10 mg at 12/29/24 0227   HYDROmorphone  (DILAUDID ) injection 1 mg, 1 mg, Intravenous, Q3H PRN, Assaker, Darrin, MD   insulin  aspart (novoLOG ) injection 0-15 Units, 0-15 Units, Subcutaneous, Q4H, Rust-Chester, Jenita CROME, NP, 3 Units at 12/29/24 0836   latanoprost  (XALATAN ) 0.005 % ophthalmic solution 1 drop, 1 drop, Both Eyes, QHS, Patel, Sona, MD, 1 drop at 12/28/24 2251   levETIRAcetam  (KEPPRA ) tablet 500 mg, 500 mg, Per Tube, BID, Rust-Chester, Jenita CROME, NP, 500 mg at 12/29/24 9161   metoprolol  tartrate (LOPRESSOR ) injection 5 mg, 5 mg, Intravenous, Q6H PRN, Rust-Chester, Jenita L, NP, 5 mg at 12/29/24 0454   norepinephrine  (LEVOPHED ) 4mg  in (0.016 mg/mL) premix infusion, 0-10 mcg/min, Intravenous, Titrated, Shellia Mann D, NP, Last Rate: 7.5 mL/hr at 12/29/24 0739, 2 mcg/min at 12/29/24 0739   ondansetron  (ZOFRAN ) tablet 4 mg, 4 mg, Per Tube, Q6H PRN **OR** ondansetron  (ZOFRAN ) injection 4 mg, 4 mg, Intravenous, Q6H PRN, Dail Rankin RAMAN, RPH   oxyCODONE  (Oxy IR/ROXICODONE ) immediate release tablet 5 mg, 5 mg, Per Tube, Q6H, Rust-Chester, Britton L, NP, 5 mg at 12/29/24 9162   polyethylene glycol (MIRALAX  / GLYCOLAX ) packet 17 g, 17 g, Per Tube, Daily PRN, Rust-Chester, Jenita L, NP   polyethylene glycol (MIRALAX  / GLYCOLAX ) packet 17 g, 17 g, Per Tube, Daily, Rust-Chester, Britton L, NP, 17 g at 12/29/24 0838   potassium & sodium phosphates  (PHOS-NAK) 280-160-250 MG packet 2 packet, 2 packet, Per Tube, Q4H, Rust-Chester, Jenita CROME, NP, 2 packet at 12/29/24 0631   propofol  (DIPRIVAN ) 1000 MG/100ML infusion,  0-80 mcg/kg/min, Intravenous, Titrated, Shellia Mann D, NP, Stopped at 12/29/24 0025   senna (SENOKOT) tablet 8.6 mg, 1 tablet, Per Tube, BID PRN, Rust-Chester, Jenita CROME, NP   senna (SENOKOT) tablet 8.6 mg, 1 tablet, Per Tube, BID, Rust-Chester, Britton L, NP, 8.6 mg at 12/29/24 [phone]

## 2024-12-29 NOTE — Plan of Care (Signed)
" °  Problem: Nutritional: Goal: Maintenance of adequate nutrition will improve Outcome: Progressing   Problem: Clinical Measurements: Goal: Respiratory complications will improve Outcome: Progressing   Problem: Education: Goal: Ability to describe self-care measures that may prevent or decrease complications (Diabetes Survival Skills Education) will improve Outcome: Not Progressing Goal: Individualized Educational Video(s) Outcome: Not Progressing   Problem: Coping: Goal: Ability to adjust to condition or change in health will improve Outcome: Not Progressing   Problem: Fluid Volume: Goal: Ability to maintain a balanced intake and output will improve Outcome: Not Progressing   Problem: Health Behavior/Discharge Planning: Goal: Ability to manage health-related needs will improve Outcome: Not Progressing   Problem: Metabolic: Goal: Ability to maintain appropriate glucose levels will improve Outcome: Not Progressing   Problem: Clinical Measurements: Goal: Ability to maintain clinical measurements within normal limits will improve Outcome: Not Progressing Goal: Diagnostic test results will improve Outcome: Not Progressing Goal: Cardiovascular complication will be avoided Outcome: Not Progressing   "

## 2024-12-29 NOTE — Progress Notes (Signed)
 Pt transported to MRI and returned to ICU 8 on the vent without incident. Pt remains on the vent and is tol well at this time.

## 2024-12-29 NOTE — Progress Notes (Signed)
 "  NAME:  DALE STRAUSSER, MRN:  [id number], DOB:  Mar 11, 1951, LOS: 1 ADMISSION DATE:  12/27/2024 History of Present Illness:  74-year-old female presenting to [facility] ED from home via EMS on 12/27/24 for evaluation of altered mental status.   History obtained per chart review as patient is unresponsive and intubated on mechanical ventilatory support without family bedside. Per EMS report patient was last seen well by her husband on 12/26/2024 around 10 PM.  He then found her down on the ground at home on 12/27/2024 with confusion and diffuse tremors.  Her baseline was described as alert and oriented x 4.  There was no report of traumatic injury and the duration of downtime unknown.   Upon arrival to ED patient alert stating she feels generally unwell but denied any specific complaints.  She denied fevers/ cough/ chest pain/ shortness of breath/ nausea/ vomiting/ diarrhea or dysuria. Patient was documented at this time as being a very poor historian, alert and oriented only to place and person.  She was unable to state why she was here and just kept saying  I do not know to the majority of questions.    Unclear per previous documentation if patient has been taking medication as prescribed.   ED course: Upon arrival patient A&O x 2, with hypertensive urgency, tachypnea and mild tachycardia.  Labs significant for hypochloremia, mild hyperglycemia, mildly elevated anion gap, lactic acidosis with mild leukocytosis.  UDS negative.  Imaging unremarkable Initial Vitals: 98.4, 34, 112, 209/91 and 97% on room air   Significant labs:  I, Jenita Ruth Rust-Chester, AGACNP-BC, personally viewed and interpreted this ECG. EKG Interpretation: Date: 12/28/2024, EKG Time: 04:12, Rate: 156, Rhythm: SVT, QRS Axis: Borderline LAD Intervals: UTA (due to rate), ST/T Wave abnormalities: ST depression suspect due to right, Narrative Interpretation: SVT   Most recent (Labs/ Imaging personally reviewed) chemistry: Na+: 137, K+:  4.1, Cl: 94, BUN/Cr.:  18/0.94, Serum CO2/ AG: 24/19, glucose: 197 Hematology: WBC: 10.7 > 13.4, Hgb: 12.1,  Lactic/ PCT: 2.9 > 3.2 > 2.7/pending, COVID-19 & Influenza A/B: Negative VBG: 7.45/44/36/30.6   CXR 12/27/2024: Mild left basilar linear atelectasis CT head wo contrast 12/27/2024: Generalized cerebral atrophy with chronic white matter small vessel ischemic changes.  No acute intracranial abnormality.  Chronic left maxillary sinus disease   Hospital course: Patient admitted by [facility] service for workup of altered mental status.  Admitting service unable to reach spouse.  Unfortunately due to altered mental status patient was unable to take p.o. medications including her nighttime dose of Keppra .  Around 4 AM staff arrived to bedside due to patient's vital sign changes on cardiac monitoring.  Upon entering the room patient unresponsive with sonorous like respirations, right gaze preference, tachycardia and hypertension. She received 1 g IV Keppra  load, requiring urgent intubation and mechanical ventilatory support.   PCCM consulted for assistance in management and monitoring due to acute hypoxic respiratory failure secondary to acute encephalopathy in the setting of suspected seizure activity requiring intubation and mechanical ventilatory support.  Pertinent  Medical History  COPD Chronic back pain on chronic opiates Seizure disorder Type 2 diabetes mellitus Diabetic gastroparesis Hypertension Hyperlipidemia CKD stage IIIa Essential tremor Atrial fibrillation Hypothyroidism Adhesive Arachnoiditis  Diverticulosis  Significant Hospital Events: Including procedures, antibiotic start and stop dates in addition to other pertinent events   12/27/2024: Admit to inpatient for observation and workup of altered mental status.  Overnight, acute hypoxic respiratory failure secondary to acute encephalopathy in the setting of suspected seizure activity  requiring intubation and mechanical ventilatory  support.  PCCM consulted  Interim History / Subjective:  -- Febrile overnight.  -- Awake and following commands. --> Moving towards extubation.  -- EEG today suggestive of cortical dysfunction arising from the left hemisphere likely secondary to underlying structural abn.  -- MRI w/ contrast without any abnormal contrast enhancement.   Objective    Blood pressure 108/83, pulse 75, temperature 98.6 F (37 C), temperature source Axillary, resp. rate 15, height 5' (1.524 m), weight 58.2 kg, SpO2 95%.    Vent Mode: PSV FiO2 (%):  [28 %] 28 % Set Rate:  [18 bmp] 18 bmp Vt Set:  [400 mL] 400 mL PEEP:  [5 cmH20] 5 cmH20 Pressure Support:  [5 cmH20] 5 cmH20   Intake/Output Summary (Last 24 hours) at 12/29/2024 1348 Last data filed at 12/29/2024 1314 Gross per 24 hour  Intake 2767.17 ml  Output 1345 ml  Net 1422.17 ml   Filed Weights   12/27/24 1538 12/28/24 0500 12/29/24 0457  Weight: 60.4 kg 58.2 kg 58.2 kg    Examination: General: Intubated, awake and alert. Following commanfs HENT: Supple neck reactive pupils Lungs: Clear bilateral air entry. Minimal Secretions.  Cardiovascular: Normal S1, Normal S2, RRR  Abdomen: Soft, non tender, non distended, +BS  Extremities: Warm and well perfused no edema.    Labs and imaging were reviewed.   Assessment and Plan  #AMS  #Low suspicion for seizure activity appears more baseline tremors and behavioral. Low suspicion for an infectious process. EEGs are suggestive of cortical dysfunction arising from the left hemisphere likely secondary to underlying structural abn. MRI brain wo contrast Flair signal abn in the left cerebellar hemisphere. MRI w/ contrast did not show any enhancement. Unclear underlying cause.  #Acute hypoxic respiratory failure secondary to the above.  #Type II DM  #COPD #Chronic back pain on oxycodone     Neuro: Lighten sedation. Moving towards extubation.  C/w Keppra . Appreciate neuro input. Hold of on LP on menigitis  coverage. Low threshold if spikes another fever.  CVS: Amlodipine  10, lopressor  25 bid and Hydral PRN to keep normotensive.   Lungs: Minimal Support. SpO2 > 90%. --> Moving towards extubation.  GI Famotidine  for Px. Can start TF in the AM. Heme: Lovenox  for DVT px.   Critical care time: 50 minutes     Darrin Barn, MD  Pulmonary Critical Care 12/29/2024 2:12 PM   "

## 2024-12-29 NOTE — Plan of Care (Signed)
" °  Problem: Coping: Goal: Ability to adjust to condition or change in health will improve Outcome: Progressing   Problem: Fluid Volume: Goal: Ability to maintain a balanced intake and output will improve Outcome: Progressing   Problem: Metabolic: Goal: Ability to maintain appropriate glucose levels will improve Outcome: Progressing   Problem: Skin Integrity: Goal: Risk for impaired skin integrity will decrease Outcome: Progressing   Problem: Tissue Perfusion: Goal: Adequacy of tissue perfusion will improve Outcome: Progressing   Problem: Health Behavior/Discharge Planning: Goal: Ability to manage health-related needs will improve Outcome: Progressing   Problem: Clinical Measurements: Goal: Will remain free from infection Outcome: Progressing Goal: Diagnostic test results will improve Outcome: Progressing Goal: Respiratory complications will improve Outcome: Progressing   Problem: Nutrition: Goal: Adequate nutrition will be maintained Outcome: Progressing   Problem: Elimination: Goal: Will not experience complications related to bowel motility Outcome: Progressing   "

## 2024-12-29 NOTE — Procedures (Addendum)
 Patient Name: Nicole Pittman  MRN: [id number]  Epilepsy Attending: Arlin MALVA Krebs  Referring Physician/Provider: Voncile Isles, MD  Date: 12/29/2024 Duration: 45.25 mins  Patient history: 74yo F with ams. EEG to evaluate for seizure   Level of alertness: comatose/ lethargic    AEDs during EEG study: Ativan    Technical aspects: This EEG study was done with scalp electrodes positioned according to the 10-20 International system of electrode placement. Electrical activity was reviewed with band pass filter of 1-70Hz , sensitivity of 7 uV/mm, display speed of 43mm/sec with a 60Hz  notched filter applied as appropriate. EEG data were recorded continuously and digitally stored.  Video monitoring was available and reviewed as appropriate.   Description: EEG showed continuous 5 to 7 Hz theta slowing in right hemisphere and low amplitude 3-5hz  theta-delta slowing in left hemisphere. Hyperventilation and photic stimulation were not performed.      ABNORMALITY - Continuous slow, generalized and lateralized left hemisphere   IMPRESSION: This study is suggestive of cortical dysfunction arising from left hemisphere likely secondary to underlying structural abnormality.  Additionally there is generalized cerebral dysfunction (encephalopathy). No seizures or definite epileptiform discharges were seen throughout the recording.  EEG appears similar to yesterday   Yazan Gatling O Coreena Rubalcava

## 2024-12-29 NOTE — Progress Notes (Signed)
 45 min eeg done

## 2024-12-29 NOTE — Consult Note (Signed)
 PHARMACY CONSULT NOTE - ELECTROLYTES  Pharmacy Consult for Electrolyte Monitoring and Replacement   Recent Labs: Potassium (mmol/L)  Date Value  12/29/2024 3.2 (L)   Magnesium  (mg/dL)  Date Value  97/95/7973 1.6 (L)   Calcium  (mg/dL)  Date Value  97/95/7973 8.4 (L)   Albumin (g/dL)  Date Value  97/95/7973 4.0  01/16/2016 4.9 (H)   Phosphorus (mg/dL)  Date Value  97/95/7973 1.7 (L)   Sodium (mmol/L)  Date Value  12/29/2024 132 (L)  09/07/2018 141   Height: 5' (152.4 cm) Weight: 58.2 kg (128 lb 4.9 oz) IBW/kg (Calculated) : 45.5 Estimated Creatinine Clearance: 48.5 mL/min (by C-G formula based on SCr of 0.8 mg/dL).  Assessment  Nicole Pittman is a 74 y.o. female presenting with altered mental status requiring intubation and hypertensive emergency. PMH significant for COPD chronic back pain on narcotics, seizure disorder, type II diabetes, diabetic gastroparesis, hypertension, hyperlipidemia. Pharmacy has been consulted to monitor and replace electrolytes.  Diet: NPO, on tube feeds MIVF: N/A Pertinent medications: N/A  Goal of Therapy: Electrolytes within normal limits  Plan:  Na 132, mildly low and down-trending. Continue to monitor K 3.2, will receive ~ 60 mEq K+ from Phos-Nak replacement Mg 1.6, magnesium  sulfate 4 g IV x 1 ordered per PCCM Phos 1.7, Phos-Nak 2 packets q4h x 4 doses ordered per PCCM Follow-up electrolytes with AM labs tomorrow  Thank you for allowing pharmacy to be a part of this patients care.  Jasher Barkan B Elijah Phommachanh 12/29/2024 7:48 AM

## 2024-12-29 NOTE — Progress Notes (Signed)
 Nutrition Follow-up  DOCUMENTATION CODES:   Not applicable  INTERVENTION:   -D/c Vital 1.5, Prosource TF20, and free water  due to lack of enteral access -RD will follow for diet advancement and add supplements as appropriate   NUTRITION DIAGNOSIS:   Inadequate oral intake related to inability to eat as evidenced by NPO status.  Ongoing  GOAL:   Patient will meet greater than or equal to 90% of their needs  Progressing   MONITOR:   PO intake, Supplement acceptance, Diet advancement  REASON FOR ASSESSMENT:   Ventilator, Consult Enteral/tube feeding initiation and management  ASSESSMENT:   Pt with hx of HTN, HLD, DM type 2 with gastroparesis, seizure disorder, CKD3, and COPD presented to ED with AMS.  2/2 - presented to ED 2/3 - intubated, starting feeds  2/4- s/p EEG- suggestive of cortical dysfunction arising from left hemisphere likely secondary to underlying structural abnormality, generalized cerebral dysfunction (encephalopathy), no seizures or definite epileptiform discharges were seen throughout the recording.   Patient is currently intubated on ventilator support. OGT present ( KUB on 12/28/24 reveals tip of tube in stomach).  MV: 7.2 L/min Temp (24hrs), Avg:100.3 F (37.9 C), Min:98.6 F (37 C), Max:103 F (39.4 C)  Reviewed I/O's: +1.1 L x 24 hours and +3.8 L since admission  UOP: 1.3 L x 24 hours   Patient on vent. No family at bedside to provide additional history.   Case discussed with RN, who reports plan for LP, EEG, and possible extubation today. Patient currently receiving TF of Vital 1.5 @ 30 ml/hr. Per RN, patient will advance to goal rate of 40 ml/hr at 1500 today if she does not extubate. (ADDENDUM: patient extubated this afternoon after RD visit).   Medications reviewed and include lovenox , keppra , miralax , and senokot.   Lab Results  Component Value Date   HGBA1C 6.4 (H) 11/03/2024   PTA DM medications are two tablets 5-500  glipizide -metformin  BID.   Labs reviewed: Na: 132, K: 3.2 (on supplementation), Phos: 1.7 (on supplementation), Mg: 1.6 (on supplementation), CBGS: 108-259 (inpatient orders for glycemic control are 0-15 units insulin  aspart every 4 hours).    NUTRITION - FOCUSED PHYSICAL EXAM:  Flowsheet Row Most Recent Value  Orbital Region No depletion  Upper Arm Region No depletion  Thoracic and Lumbar Region No depletion  Buccal Region No depletion  Temple Region No depletion  Clavicle Bone Region No depletion  Clavicle and Acromion Bone Region No depletion  Scapular Bone Region No depletion  Dorsal Hand No depletion  Patellar Region No depletion  Anterior Thigh Region No depletion  Posterior Calf Region No depletion  Edema (RD Assessment) None  Hair Reviewed  Eyes Reviewed  Mouth Reviewed  Skin Reviewed  Nails Reviewed    Diet Order:   Diet Order             Diet NPO time specified  Diet effective now                   EDUCATION NEEDS:   Not appropriate for education at this time  Skin:  Skin Assessment: Skin Integrity Issues: Skin Integrity Issues:: Stage I Stage I: sacrum  Last BM:  12/28/24 (type 1)  Height:   Ht Readings from Last 1 Encounters:  12/27/24 5' (1.524 m)    Weight:   Wt Readings from Last 1 Encounters:  12/29/24 58.2 kg    Ideal Body Weight:  45.5 kg  BMI:  Body mass index is 25.06 kg/m.  Estimated Nutritional Needs:   Kcal:  1550-1750  Protein:  75-90 grams  Fluid:  1.5-1.7 L    Margery ORN, RD, LDN, CDCES Registered Dietitian III Certified Diabetes Care and Education Specialist If unable to reach this RD, please use RD Inpatient group chat on secure chat between hours of 8am-4 pm daily

## 2024-12-30 DIAGNOSIS — G40909 Epilepsy, unspecified, not intractable, without status epilepticus: Secondary | ICD-10-CM | POA: Diagnosis not present

## 2024-12-30 DIAGNOSIS — J449 Chronic obstructive pulmonary disease, unspecified: Secondary | ICD-10-CM | POA: Diagnosis not present

## 2024-12-30 DIAGNOSIS — R4182 Altered mental status, unspecified: Secondary | ICD-10-CM | POA: Diagnosis not present

## 2024-12-30 DIAGNOSIS — I639 Cerebral infarction, unspecified: Secondary | ICD-10-CM | POA: Diagnosis not present

## 2024-12-30 DIAGNOSIS — E119 Type 2 diabetes mellitus without complications: Secondary | ICD-10-CM | POA: Diagnosis not present

## 2024-12-30 DIAGNOSIS — J9601 Acute respiratory failure with hypoxia: Secondary | ICD-10-CM | POA: Diagnosis not present

## 2024-12-30 DIAGNOSIS — T450X5A Adverse effect of antiallergic and antiemetic drugs, initial encounter: Secondary | ICD-10-CM | POA: Diagnosis not present

## 2024-12-30 LAB — RENAL FUNCTION PANEL
Albumin: 3.7 g/dL (ref 3.5–5.0)
Anion gap: 16 — ABNORMAL HIGH (ref 5–15)
BUN: 15 mg/dL (ref 8–23)
CO2: 24 mmol/L (ref 22–32)
Calcium: 8.2 mg/dL — ABNORMAL LOW (ref 8.9–10.3)
Chloride: 95 mmol/L — ABNORMAL LOW (ref 98–111)
Creatinine, Ser: 0.7 mg/dL (ref 0.44–1.00)
GFR, Estimated: >60 mL/min
Glucose, Bld: 158 mg/dL — ABNORMAL HIGH (ref 70–99)
Phosphorus: 3.4 mg/dL (ref 2.5–4.6)
Potassium: 3.2 mmol/L — ABNORMAL LOW (ref 3.5–5.1)
Sodium: 136 mmol/L (ref 135–145)

## 2024-12-30 LAB — GLUCOSE, CAPILLARY
Glucose-Capillary: 146 mg/dL — ABNORMAL HIGH (ref 70–99)
Glucose-Capillary: 132 mg/dL — ABNORMAL HIGH (ref 70–99)
Glucose-Capillary: 129 mg/dL — ABNORMAL HIGH (ref 70–99)
Glucose-Capillary: 99 mg/dL (ref 70–99)
Glucose-Capillary: 130 mg/dL — ABNORMAL HIGH (ref 70–99)
Glucose-Capillary: 122 mg/dL — ABNORMAL HIGH (ref 70–99)

## 2024-12-30 LAB — MAGNESIUM: Magnesium: 2.1 mg/dL (ref 1.7–2.4)

## 2024-12-30 LAB — CBC
HCT: 29.6 % — ABNORMAL LOW (ref 36.0–46.0)
Hemoglobin: 9.8 g/dL — ABNORMAL LOW (ref 12.0–15.0)
MCH: 29.4 pg (ref 26.0–34.0)
MCHC: 33.1 g/dL (ref 30.0–36.0)
MCV: 88.9 fL (ref 80.0–100.0)
Platelets: 259 10*3/uL (ref 150–400)
RBC: 3.33 MIL/uL — ABNORMAL LOW (ref 3.87–5.11)
RDW: 13.8 % (ref 11.5–15.5)
WBC: 11.2 10*3/uL — ABNORMAL HIGH (ref 4.0–10.5)
nRBC: 0 % (ref 0.0–0.2)

## 2024-12-30 LAB — VITAMIN B1: Vitamin B1 (Thiamine): 95.4 nmol/L (ref 66.5–200.0)

## 2024-12-30 LAB — PROCALCITONIN: Procalcitonin: 0.19 ng/mL

## 2024-12-30 LAB — VITAMIN B12: Vitamin B-12: 218 pg/mL (ref 180–914)

## 2024-12-30 LAB — CULTURE, RESPIRATORY W GRAM STAIN: Culture: NORMAL

## 2024-12-30 MED ORDER — CYANOCOBALAMIN 1000 MCG/ML IJ SOLN
1000.0000 ug | Freq: Once | INTRAMUSCULAR | Status: AC
  Administered 2024-12-30: 1000 ug via INTRAMUSCULAR
  Filled 2024-12-30: qty 1

## 2024-12-30 MED ORDER — POTASSIUM CHLORIDE 10 MEQ/100ML IV SOLN
10.0000 meq | INTRAVENOUS | Status: AC
  Administered 2024-12-30 (×6): 10 meq via INTRAVENOUS
  Filled 2024-12-30 (×6): qty 100

## 2024-12-30 NOTE — Progress Notes (Addendum)
 0800 Awakened to do 0800 assessment. Patient slow to respond to questions. Not interested in conversation. Dirt? Noted under all fingernails. When asked patient states she lays on the couch and watches TV. Patient's pain medication is scheduled.  1000 Patient states she gets her own medication for herself. She does not say she cooks. Has a short attention span. Ask about her seizures-patient states she takes Keppra  for restless leg syndrome not seizures. Checks her blood sugar 3 times a day but does not take extra insulin  or po medication when blood sugar is elevated. After a fewe minutes of conversation she no longer answers questions or talks to nurse. Awaiting swallow evaluation for po medications. 1200 Pain medication changed to PRN. 1700 Had 3 large diarrhea stools. Lethargic and slept all day. Speech eval and treat ordered. Still not interactive unless pushed to speak. 1715 Husband in to visit. States (per husband) Patient does not cook or clean. Does not sleep well. They have an ausitictic son that is 26 years old. Ask to speak to MD. CHARM Moose notified but tied up in ED. Patient and husband informed.

## 2024-12-30 NOTE — Progress Notes (Addendum)
 NEUROLOGY CONSULT FOLLOW UP NOTE   Interval Hx/subjective  Seen and examined in the ICU.  Off of propofol , currently on low-dose Precedex .  No acute changes overnight.  Does not still follow any commands per the care team.  Remains intubated  Vitals   Vitals:   12/30/24 0530 12/30/24 0600 12/30/24 0630 12/30/24 0700  BP: (!) 162/46 (!) 145/50 (!) 155/51 (!) 155/58  Pulse: 85 79 73 71  Resp: (!) 21 (!) 26 14 18   Temp:      TempSrc:      SpO2: 96% 94% 97% 96%  Weight:      Height:         Body mass index is 24.37 kg/m.  Physical Exam  General: Awake alert in no distress HEENT: Normocephalic atraumatic Respiratory: Breathing well saturating normally on room air Neurological exam She was awake alert oriented x 3 No dysarthria No aphasia Cranial nerves II to XII intact Motor examination with no drift in any of the 4 extremities Sensory exam reveals intact sensation to light touch all over Coordination examination: Has tremulousness in her body and mild cogwheeling but no gross dysmetria  Medications Current Medications[1]  Labs and Diagnostic Imaging   CBC:  Recent Labs  Lab 12/27/24 1540 12/27/24 2056 12/29/24 0325 12/30/24 0408  WBC 10.7*   < > 9.9 11.2*  NEUTROABS 7.6  --   --   --   HGB 12.7   < > 10.8* 9.8*  HCT 38.7   < > 32.6* 29.6*  MCV 88.6   < > 89.8 88.9  PLT 344   < > 244 259   < > = values in this interval not displayed.    Basic Metabolic Panel:  Lab Results  Component Value Date   NA 136 12/30/2024   K 3.2 (L) 12/30/2024   CO2 24 12/30/2024   GLUCOSE 158 (H) 12/30/2024   BUN 15 12/30/2024   CREATININE 0.70 12/30/2024   CALCIUM  8.2 (L) 12/30/2024   GFRNONAA >60 12/30/2024   GFRAA >60 01/24/2020   Lipid Panel:  Lab Results  Component Value Date   LDLCALC 61 01/24/2020   HgbA1c:  Lab Results  Component Value Date   HGBA1C 6.4 (H) 11/03/2024   Urine Drug Screen:     Component Value Date/Time   LABOPIA NEGATIVE 12/27/2024 1540    COCAINSCRNUR NEGATIVE 12/27/2024 1540   COCAINSCRNUR NONE DETECTED 01/23/2020 1200   LABBENZ NEGATIVE 12/27/2024 1540   AMPHETMU NEGATIVE 12/27/2024 1540   THCU NEGATIVE 12/27/2024 1540   LABBARB NEGATIVE 12/27/2024 1540    Imaging personally reviewed CT head: No acute findings MRI brain with and without contrast-on the MRI brain without contrast there was a concern for a newly identified lesion in the left cerebellum but contrast imaging does not reveal any evidence of enhancement.  Neurodiagnostics: EEG showed continuous generalized slowing and lateralized left hemispheric slowing  Prior EEG from December had intermittent generalized slowing..  Other labs Thiamine pending B12 202 TSH 0.587 RPR nonreactive  Assessment   Nicole Pittman is a 74 y.o. female past history of chronic back pain on opiates, COPD, seizure disorder, diabetes, diabetic gastroparesis, hypertension, hyperlipidemia presented for evaluation of altered mental status and concern for seizure-like activity due to having been found less responsive with sonorous breathing and rightward  gaze while in the hospital.  Unclear if she is compliant to medications at home. Has a history of baseline tremors -Has been on Reglan , may be related to medications Brain  MRI concerning for a new cerebellar lesion which on contrast imaging shows no enhancement.  Likely an interval infarct, less likely to be infectious or inflammatory. EEG shows left-sided slowing with no clear structural cause. She has been extubated and is nonfocal I suspect seizure with prolonged postictal state, but not entirely sure    Recommendations  Continue antiepileptics Continue B12 replenishment Follow-up with outpatient neurology  Neurology available PRN Plan discussed with PCCM attending Dr. ROEL Barn  ______________________________________________________________________   Signed, Eligio Lav, MD [facility]     [1]  Current  Facility-Administered Medications:    acetaminophen  (TYLENOL ) tablet 650 mg, 650 mg, Per Tube, Q6H PRN **OR** acetaminophen  (TYLENOL ) suppository 650 mg, 650 mg, Rectal, Q6H PRN, Dail Rankin RAMAN, RPH   aspirin  chewable tablet 81 mg, 81 mg, Per Tube, Daily, Clair Marolyn NOVAK, RPH, 81 mg at 12/29/24 9161   atorvastatin  (LIPITOR) tablet 40 mg, 40 mg, Per Tube, QHS, Rust-Chester, Jenita CROME, NP, 40 mg at 12/28/24 2210   Chlorhexidine  Gluconate Cloth 2 % PADS 6 each, 6 each, Topical, Daily, Rust-Chester, Britton L, NP, 6 each at 12/30/24 0930   enoxaparin  (LOVENOX ) injection 40 mg, 40 mg, Subcutaneous, Q24H, Patel, Sona, MD, 40 mg at 12/29/24 2140   fenofibrate  tablet 54 mg, 54 mg, Per Tube, Daily, Rust-Chester, Jenita CROME, NP, 54 mg at 12/29/24 9094   hydrALAZINE  (APRESOLINE ) injection 10 mg, 10 mg, Intravenous, Q4H PRN, Assaker, Darrin, MD, 10 mg at 12/30/24 0109   HYDROmorphone  (DILAUDID ) injection 0.5 mg, 0.5 mg, Intravenous, Q4H, Rust-Chester, Britton L, NP, 0.5 mg at 12/30/24 9157   HYDROmorphone  (DILAUDID ) injection 1 mg, 1 mg, Intravenous, Q3H PRN, Assaker, Darrin, MD, 1 mg at 12/29/24 1640   insulin  aspart (novoLOG ) injection 0-15 Units, 0-15 Units, Subcutaneous, Q4H, Rust-Chester, Britton L, NP, 2 Units at 12/30/24 0843   labetalol  (NORMODYNE ) injection 10-20 mg, 10-20 mg, Intravenous, Q2H PRN, Rust-Chester, Britton L, NP, 20 mg at 12/30/24 0557   latanoprost  (XALATAN ) 0.005 % ophthalmic solution 1 drop, 1 drop, Both Eyes, QHS, Tobie Calix, MD, 1 drop at 12/29/24 2215   levETIRAcetam  (KEPPRA ) undiluted injection 500 mg, 500 mg, Intravenous, Q12H, Rust-Chester, Jenita L, NP, 500 mg at 12/29/24 2207   norepinephrine  (LEVOPHED ) 4mg  in (0.016 mg/mL) premix infusion, 0-10 mcg/min, Intravenous, Titrated, Shellia Mann D, NP, Stopped at 12/29/24 1446   ondansetron  (ZOFRAN ) tablet 4 mg, 4 mg, Per Tube, Q6H PRN **OR** ondansetron  (ZOFRAN ) injection 4 mg, 4 mg, Intravenous, Q6H PRN, Dail Rankin RAMAN, RPH   oxyCODONE  (Oxy IR/ROXICODONE ) immediate release tablet 5 mg, 5 mg, Per Tube, Q6H, Rust-Chester, Britton L, NP, 5 mg at 12/29/24 1308   polyethylene glycol (MIRALAX  / GLYCOLAX ) packet 17 g, 17 g, Per Tube, Daily PRN, Rust-Chester, Jenita L, NP   potassium chloride  10 mEq in 100 mL IVPB, 10 mEq, Intravenous, Q1 Hr x 6, Rust-Chester, Britton L, NP, Last Rate: 100 mL/hr at 12/30/24 1000, 10 mEq at 12/30/24 1000   senna (SENOKOT) tablet 8.6 mg, 1 tablet, Per Tube, BID PRN, Rust-Chester, Jenita CROME, NP

## 2024-12-30 NOTE — Progress Notes (Signed)
 "  NAME:  TYANN NIEHAUS, MRN:  [id number], DOB:  06/09/1951, LOS: 2 ADMISSION DATE:  12/27/2024 History of Present Illness:  74-year-old female presenting to [facility] ED from home via EMS on 12/27/24 for evaluation of altered mental status.   History obtained per chart review as patient is unresponsive and intubated on mechanical ventilatory support without family bedside. Per EMS report patient was last seen well by her husband on 12/26/2024 around 10 PM.  He then found her down on the ground at home on 12/27/2024 with confusion and diffuse tremors.  Her baseline was described as alert and oriented x 4.  There was no report of traumatic injury and the duration of downtime unknown.   Upon arrival to ED patient alert stating she feels generally unwell but denied any specific complaints.  She denied fevers/ cough/ chest pain/ shortness of breath/ nausea/ vomiting/ diarrhea or dysuria. Patient was documented at this time as being a very poor historian, alert and oriented only to place and person.  She was unable to state why she was here and just kept saying  I do not know to the majority of questions.    Unclear per previous documentation if patient has been taking medication as prescribed.   ED course: Upon arrival patient A&O x 2, with hypertensive urgency, tachypnea and mild tachycardia.  Labs significant for hypochloremia, mild hyperglycemia, mildly elevated anion gap, lactic acidosis with mild leukocytosis.  UDS negative.  Imaging unremarkable Initial Vitals: 98.4, 34, 112, 209/91 and 97% on room air   Significant labs:  I, Jenita Ruth Rust-Chester, AGACNP-BC, personally viewed and interpreted this ECG. EKG Interpretation: Date: 12/28/2024, EKG Time: 04:12, Rate: 156, Rhythm: SVT, QRS Axis: Borderline LAD Intervals: UTA (due to rate), ST/T Wave abnormalities: ST depression suspect due to right, Narrative Interpretation: SVT   Most recent (Labs/ Imaging personally reviewed) chemistry: Na+: 137, K+:  4.1, Cl: 94, BUN/Cr.:  18/0.94, Serum CO2/ AG: 24/19, glucose: 197 Hematology: WBC: 10.7 > 13.4, Hgb: 12.1,  Lactic/ PCT: 2.9 > 3.2 > 2.7/pending, COVID-19 & Influenza A/B: Negative VBG: 7.45/44/36/30.6   CXR 12/27/2024: Mild left basilar linear atelectasis CT head wo contrast 12/27/2024: Generalized cerebral atrophy with chronic white matter small vessel ischemic changes.  No acute intracranial abnormality.  Chronic left maxillary sinus disease   Hospital course: Patient admitted by [facility] service for workup of altered mental status.  Admitting service unable to reach spouse.  Unfortunately due to altered mental status patient was unable to take p.o. medications including her nighttime dose of Keppra .  Around 4 AM staff arrived to bedside due to patient's vital sign changes on cardiac monitoring.  Upon entering the room patient unresponsive with sonorous like respirations, right gaze preference, tachycardia and hypertension. She received 1 g IV Keppra  load, requiring urgent intubation and mechanical ventilatory support.   PCCM consulted for assistance in management and monitoring due to acute hypoxic respiratory failure secondary to acute encephalopathy in the setting of suspected seizure activity requiring intubation and mechanical ventilatory support.  Pertinent  Medical History  COPD Chronic back pain on chronic opiates Seizure disorder Type 2 diabetes mellitus Diabetic gastroparesis Hypertension Hyperlipidemia CKD stage IIIa Essential tremor Atrial fibrillation Hypothyroidism Adhesive Arachnoiditis  Diverticulosis  Significant Hospital Events: Including procedures, antibiotic start and stop dates in addition to other pertinent events   12/27/2024: Admit to inpatient for observation and workup of altered mental status.  Overnight, acute hypoxic respiratory failure secondary to acute encephalopathy in the setting of suspected seizure activity  requiring intubation and mechanical ventilatory  support.  PCCM consulted  Interim History / Subjective:  -- Febrile overnight.  -- Awake and following commands. Extubated 02/04 -- EEG suggestive of cortical dysfunction arising from the left hemisphere likely secondary to underlying structural abn.  -- MRI w/ contrast without any abnormal contrast enhancement.   Objective    Blood pressure (!) 163/55, pulse 74, temperature 99.3 F (37.4 C), temperature source Oral, resp. rate 17, height 5' (1.524 m), weight 56.6 kg, SpO2 96%.    Vent Mode: PSV FiO2 (%):  [35 %] 35 % PEEP:  [5 cmH20] 5 cmH20 Pressure Support:  [5 cmH20] 5 cmH20   Intake/Output Summary (Last 24 hours) at 12/30/2024 1254 Last data filed at 12/30/2024 1200 Gross per 24 hour  Intake 549.38 ml  Output 1900 ml  Net -1350.62 ml   Filed Weights   12/28/24 0500 12/29/24 0457 12/30/24 0352  Weight: 58.2 kg 58.2 kg 56.6 kg    Examination: General: Intubated, awake and alert. Following commanfs HENT: Supple neck reactive pupils Lungs: Clear bilateral air entry. Minimal Secretions.  Cardiovascular: Normal S1, Normal S2, RRR  Abdomen: Soft, non tender, non distended, +BS  Extremities: Warm and well perfused no edema.    Labs and imaging were reviewed.   Assessment and Plan  #AMS  #Low suspicion for seizure activity appears more baseline tremors and behavioral. Low suspicion for an infectious process. EEGs are suggestive of cortical dysfunction arising from the left cerebellar hemisphere likely secondary to underlying structural abn. MRI brain wo contrast Flair signal abn in the left cerebellar hemisphere. MRI w/ contrast did not show any enhancement. Unclear underlying cause.  #Acute hypoxic respiratory failure secondary to the above.  #Type II DM  #COPD #Chronic back pain on oxycodone     Neuro:  C/w Keppra . Appreciate neuro input. Hold of on LP on menigitis coverage. Low threshold if spikes another fever. Afebrile overnight.  CVS: Amlodipine  10, lopressor  25 bid and  Hydral PRN to keep normotensive.   Lungs: SpO2 > 90%. GI Famotidine  for Px. Can start TF in the AM. Heme: Lovenox  for DVT px.   OK transfer to [facility] Critical care time: 40 minutes     Darrin Barn, MD [facility] 12/30/2024 12:54 PM   "

## 2024-12-30 NOTE — Progress Notes (Signed)
 Updated pts spouse Tanny Harnack via telephone informed him pt successfully extubated 02/04 and remains stable post extubation.  Will transfer pt out of ICU once a bed becomes available.  All questions were answered will continue to monitor and assess pt.  Lonell Moose, AGNP  Pulmonary/Critical Care Pager [phone] (please enter 7 digits) PCCM Consult Pager [phone] (please enter 7 digits)

## 2024-12-30 NOTE — Plan of Care (Signed)
" °  Problem: Fluid Volume: Goal: Ability to maintain a balanced intake and output will improve Outcome: Progressing   Problem: Clinical Measurements: Goal: Cardiovascular complication will be avoided Outcome: Progressing   Problem: Pain Managment: Goal: General experience of comfort will improve and/or be controlled Outcome: Progressing   Problem: Safety: Goal: Ability to remain free from injury will improve Outcome: Progressing   "

## 2024-12-30 NOTE — Consult Note (Addendum)
 PHARMACY CONSULT NOTE - ELECTROLYTES  Pharmacy Consult for Electrolyte Monitoring and Replacement   Recent Labs: Potassium (mmol/L)  Date Value  12/30/2024 3.2 (L)   Magnesium  (mg/dL)  Date Value  97/94/7973 2.1   Calcium  (mg/dL)  Date Value  97/94/7973 8.2 (L)   Albumin (g/dL)  Date Value  97/94/7973 3.7  01/16/2016 4.9 (H)   Phosphorus (mg/dL)  Date Value  97/94/7973 3.4   Sodium (mmol/L)  Date Value  12/30/2024 136  09/07/2018 141   Height: 5' (152.4 cm) Weight: 56.6 kg (124 lb 12.5 oz) IBW/kg (Calculated) : 45.5 Estimated Creatinine Clearance: 47.9 mL/min (by C-G formula based on SCr of 0.7 mg/dL).  Assessment  Nicole Pittman is a 74 y.o. female presenting with altered mental status requiring intubation and hypertensive emergency. PMH significant for COPD chronic back pain on narcotics, seizure disorder, type II diabetes, diabetic gastroparesis, hypertension, hyperlipidemia. Pharmacy has been consulted to monitor and replace electrolytes.  Diet: NPO MIVF: N/A Pertinent medications: N/A  Goal of Therapy: Electrolytes within normal limits  Plan:  K 3.2, Kcl 10 mEq IV q1h x 6 per PCCM Follow-up electrolytes with AM labs tomorrow  Thank you for allowing pharmacy to be a part of this patients care.  [name] B [name] 12/30/2024 7:49 AM

## 2024-12-31 ENCOUNTER — Encounter: Payer: Self-pay | Admitting: Pulmonary Disease

## 2024-12-31 DIAGNOSIS — E872 Acidosis, unspecified: Secondary | ICD-10-CM | POA: Insufficient documentation

## 2024-12-31 DIAGNOSIS — E876 Hypokalemia: Secondary | ICD-10-CM | POA: Insufficient documentation

## 2024-12-31 DIAGNOSIS — E871 Hypo-osmolality and hyponatremia: Secondary | ICD-10-CM | POA: Insufficient documentation

## 2024-12-31 DIAGNOSIS — L8990 Pressure ulcer of unspecified site, unspecified stage: Secondary | ICD-10-CM | POA: Insufficient documentation

## 2024-12-31 LAB — GLUCOSE, CAPILLARY
Glucose-Capillary: 120 mg/dL — ABNORMAL HIGH (ref 70–99)
Glucose-Capillary: 124 mg/dL — ABNORMAL HIGH (ref 70–99)
Glucose-Capillary: 126 mg/dL — ABNORMAL HIGH (ref 70–99)
Glucose-Capillary: 145 mg/dL — ABNORMAL HIGH (ref 70–99)
Glucose-Capillary: 184 mg/dL — ABNORMAL HIGH (ref 70–99)
Glucose-Capillary: 239 mg/dL — ABNORMAL HIGH (ref 70–99)
Glucose-Capillary: 349 mg/dL — ABNORMAL HIGH (ref 70–99)

## 2024-12-31 LAB — RENAL FUNCTION PANEL
Albumin: 3.5 g/dL (ref 3.5–5.0)
Anion gap: 16 — ABNORMAL HIGH (ref 5–15)
BUN: 16 mg/dL (ref 8–23)
CO2: 23 mmol/L (ref 22–32)
Calcium: 8.6 mg/dL — ABNORMAL LOW (ref 8.9–10.3)
Chloride: 99 mmol/L (ref 98–111)
Creatinine, Ser: 0.66 mg/dL (ref 0.44–1.00)
GFR, Estimated: >60 mL/min
Glucose, Bld: 120 mg/dL — ABNORMAL HIGH (ref 70–99)
Phosphorus: 2.5 mg/dL (ref 2.5–4.6)
Potassium: 3.5 mmol/L (ref 3.5–5.1)
Sodium: 138 mmol/L (ref 135–145)

## 2024-12-31 LAB — IRON AND TIBC
Iron: 46 ug/dL (ref 28–170)
Saturation Ratios: 15 % (ref 10.4–31.8)
TIBC: 315 ug/dL (ref 250–450)
UIBC: 269 ug/dL

## 2024-12-31 LAB — CBC
HCT: 32.6 % — ABNORMAL LOW (ref 36.0–46.0)
Hemoglobin: 10.5 g/dL — ABNORMAL LOW (ref 12.0–15.0)
MCH: 29.8 pg (ref 26.0–34.0)
MCHC: 32.2 g/dL (ref 30.0–36.0)
MCV: 92.6 fL (ref 80.0–100.0)
Platelets: 284 10*3/uL (ref 150–400)
RBC: 3.52 MIL/uL — ABNORMAL LOW (ref 3.87–5.11)
RDW: 13.7 % (ref 11.5–15.5)
WBC: 10.7 10*3/uL — ABNORMAL HIGH (ref 4.0–10.5)
nRBC: 0 % (ref 0.0–0.2)

## 2024-12-31 LAB — CULTURE, BLOOD (ROUTINE X 2)
Culture: NO GROWTH
Culture: NO GROWTH
Special Requests: ADEQUATE

## 2024-12-31 LAB — HEMOGLOBIN
Hemoglobin: 10.8 g/dL — ABNORMAL LOW (ref 12.0–15.0)
Hemoglobin: 10.1 g/dL — ABNORMAL LOW (ref 12.0–15.0)

## 2024-12-31 LAB — FERRITIN: Ferritin: 125 ng/mL (ref 11–307)

## 2024-12-31 LAB — MAGNESIUM: Magnesium: 1.8 mg/dL (ref 1.7–2.4)

## 2024-12-31 MED ORDER — SENNA 8.6 MG PO TABS: 1.0000 | ORAL_TABLET | Freq: Two times a day (BID) | ORAL | Status: AC | PRN

## 2024-12-31 MED ORDER — ALBUTEROL SULFATE (2.5 MG/3ML) 0.083% IN NEBU: 2.5000 mg | INHALATION_SOLUTION | RESPIRATORY_TRACT | Status: DC

## 2024-12-31 MED ORDER — ADULT MULTIVITAMIN W/MINERALS CH
1.0000 | ORAL_TABLET | Freq: Every day | ORAL | Status: AC
  Administered 2024-12-31: 1 via ORAL
  Filled 2024-12-31: qty 1

## 2024-12-31 MED ORDER — LEVETIRACETAM 500 MG PO TABS
500.0000 mg | ORAL_TABLET | Freq: Two times a day (BID) | ORAL | Status: AC
  Administered 2024-12-31: 500 mg via ORAL
  Filled 2024-12-31 (×2): qty 1

## 2024-12-31 MED ORDER — POLYETHYLENE GLYCOL 3350 17 G PO PACK: 17.0000 g | PACK | Freq: Every day | ORAL | Status: AC | PRN

## 2024-12-31 MED ORDER — ACETAMINOPHEN 650 MG RE SUPP: 650.0000 mg | Freq: Four times a day (QID) | RECTAL | Status: AC | PRN

## 2024-12-31 MED ORDER — CYANOCOBALAMIN 1000 MCG/ML IJ SOLN
1000.0000 ug | Freq: Every day | INTRAMUSCULAR | Status: AC
  Administered 2024-12-31: 1000 ug via INTRAMUSCULAR
  Filled 2024-12-31: qty 1

## 2024-12-31 MED ORDER — ENSURE PLUS HIGH PROTEIN PO LIQD
237.0000 mL | Freq: Three times a day (TID) | ORAL | Status: AC
  Administered 2024-12-31 (×2): 237 mL via ORAL

## 2024-12-31 MED ORDER — ACETAMINOPHEN 325 MG PO TABS: 650.0000 mg | ORAL_TABLET | Freq: Four times a day (QID) | ORAL | Status: AC | PRN

## 2024-12-31 MED ORDER — ALBUTEROL SULFATE (2.5 MG/3ML) 0.083% IN NEBU: 2.5000 mg | INHALATION_SOLUTION | RESPIRATORY_TRACT | Status: AC | PRN

## 2024-12-31 MED ORDER — SODIUM CHLORIDE 0.9 % IV SOLN: 12.5000 mg | Freq: Four times a day (QID) | INTRAVENOUS | Status: AC | PRN

## 2024-12-31 MED ORDER — FENOFIBRATE 54 MG PO TABS: 54.0000 mg | ORAL_TABLET | Freq: Every day | ORAL | Status: AC

## 2024-12-31 MED ORDER — ATORVASTATIN CALCIUM 20 MG PO TABS
40.0000 mg | ORAL_TABLET | Freq: Every day | ORAL | Status: AC
  Administered 2024-12-31: 40 mg via ORAL
  Filled 2024-12-31: qty 2

## 2024-12-31 MED ORDER — LISINOPRIL 10 MG PO TABS
10.0000 mg | ORAL_TABLET | Freq: Every day | ORAL | Status: AC
  Administered 2024-12-31: 10 mg via ORAL
  Filled 2024-12-31: qty 2

## 2024-12-31 MED ORDER — PANTOPRAZOLE SODIUM 40 MG IV SOLR
40.0000 mg | Freq: Two times a day (BID) | INTRAVENOUS | Status: AC
  Administered 2024-12-31 (×2): 40 mg via INTRAVENOUS
  Filled 2024-12-31 (×2): qty 10

## 2024-12-31 MED ORDER — AMLODIPINE BESYLATE 10 MG PO TABS
10.0000 mg | ORAL_TABLET | Freq: Every day | ORAL | Status: AC
  Administered 2024-12-31: 10 mg via ORAL
  Filled 2024-12-31: qty 1

## 2024-12-31 MED ORDER — ONDANSETRON HCL 4 MG PO TABS: 4.0000 mg | ORAL_TABLET | Freq: Four times a day (QID) | ORAL | Status: AC | PRN

## 2024-12-31 MED ORDER — INFLUENZA VIRUS VACC SPLIT PF (FLUZONE) 0.5 ML IM SUSY: 0.5000 mL | PREFILLED_SYRINGE | INTRAMUSCULAR | Status: AC

## 2024-12-31 MED ORDER — AMLODIPINE BESYLATE 5 MG PO TABS: 5.0000 mg | ORAL_TABLET | Freq: Every day | ORAL | Status: DC

## 2024-12-31 MED ORDER — ONDANSETRON HCL 4 MG/2ML IJ SOLN: 4.0000 mg | Freq: Four times a day (QID) | INTRAMUSCULAR | Status: AC | PRN

## 2024-12-31 MED ORDER — LACTATED RINGERS IV BOLUS
500.0000 mL | Freq: Once | INTRAVENOUS | Status: AC
  Administered 2024-12-31: 500 mL via INTRAVENOUS

## 2024-12-31 MED ORDER — ASPIRIN 81 MG PO CHEW: 81.0000 mg | CHEWABLE_TABLET | Freq: Every day | ORAL | Status: AC

## 2024-12-31 NOTE — Plan of Care (Signed)

## 2024-12-31 NOTE — Progress Notes (Signed)
 Nutrition Follow-up  DOCUMENTATION CODES:   Not applicable  INTERVENTION:   -Continue with soft diet -Ensure Plus High Protein po TID, each supplement provides 350 kcal and 20 grams of protein  -Magic cup TID with meals, each supplement provides 290 kcal and 9 grams of protein  -MVI with minerals daily -Feeding assistance with meals   NUTRITION DIAGNOSIS:   Inadequate oral intake related to inability to eat as evidenced by NPO status.  Progressing; advanced to PO diet on 12/31/24  GOAL:   Patient will meet greater than or equal to 90% of their needs  Progressing   MONITOR:   PO intake, Supplement acceptance  REASON FOR ASSESSMENT:   Ventilator, Consult Enteral/tube feeding initiation and management  ASSESSMENT:   Pt with hx of HTN, HLD, DM type 2 with gastroparesis, seizure disorder, CKD3, and COPD presented to ED with AMS.  2/2 - presented to ED 2/3 - intubated, starting feeds  2/4- s/p EEG- suggestive of cortical dysfunction arising from left hemisphere likely secondary to underlying structural abnormality, generalized cerebral dysfunction (encephalopathy), no seizures or definite epileptiform discharges were seen throughout the recording, extubated 2/6- advanced to soft diet   Reviewed I/O's: -1.5 L x 24 hours and +1.7 L since admission  UOP: 1.5 L x 24 hours  Patient sitting up in bed at time of visit. She greeted this RD when entering room. SHe reports she is feeling well and has a good appetite. Observed breakfast tray at bedside; patient consumed 100% of fruit cup and a few bites of eggs. She denies any chewing or swallowing difficulties.   Discussed importance of good meal and supplement intake to promote healing. Patient amenable to supplements.   Reviewed weights; weight has ranged from 55.1-60.4 kg since admission.   Medications reviewed and include vitamin B-12, keppra , phenergan, and protonix .   Per TOC notes, plan for home health services at  discharge. [facility] refusing SNF placement.   Labs reviewed: CBGS: 99-184 (inpatient orders for glycemic control are 0-15 units insulin  aspart every 4 hours).    NUTRITION - FOCUSED PHYSICAL EXAM:  Flowsheet Row Most Recent Value  Orbital Region No depletion  Upper Arm Region No depletion  Thoracic and Lumbar Region No depletion  Buccal Region No depletion  Temple Region No depletion  Clavicle Bone Region No depletion  Clavicle and Acromion Bone Region No depletion  Scapular Bone Region No depletion  Dorsal Hand No depletion  Patellar Region No depletion  Anterior Thigh Region No depletion  Posterior Calf Region No depletion  Edema (RD Assessment) Mild  Hair Reviewed  Eyes Reviewed  Mouth Reviewed  Skin Reviewed  Nails Reviewed    Diet Order:   Diet Order             DIET SOFT Room service appropriate? Yes; Fluid consistency: Thin  Diet effective now                   EDUCATION NEEDS:   Education needs have been addressed  Skin:  Skin Assessment: Skin Integrity Issues: Skin Integrity Issues:: Stage I Stage I: sacrum  Last BM:  12/31/24 (type 7)  Height:   Ht Readings from Last 1 Encounters:  12/27/24 5' (1.524 m)    Weight:   Wt Readings from Last 1 Encounters:  12/31/24 55.1 kg    Ideal Body Weight:  45.5 kg  BMI:  Body mass index is 23.72 kg/m.  Estimated Nutritional Needs:   Kcal:  1550-1750  Protein:  75-90  grams  Fluid:  1.5-1.7 L    Margery ORN, RD, LDN, CDCES Registered Dietitian III Certified Diabetes Care and Education Specialist If unable to reach this RD, please use RD Inpatient group chat on secure chat between hours of 8am-4 pm daily

## 2024-12-31 NOTE — Progress Notes (Signed)
 SLP Cancellation Note  Patient Details Name: MADYSEN FAIRCLOTH MRN: [id number] DOB: 11/16/1951   Cancelled treatment:       Reason Eval/Treat Not Completed: SLP screened, no needs identified, will sign off   Rilley Poulter 12/31/2024, 1:25 PM

## 2024-12-31 NOTE — Evaluation (Signed)
 Occupational Therapy Evaluation Patient Details Name: Nicole Pittman MRN: [id number] DOB: 1951/07/09 Today's Date: 12/31/2024   History of Present Illness   Pt is a 74 y/o F admitted on 12/27/24 after presenting with confusion. Pt developed acute respiratory failure 2/2 acute encephalopathy in the setting of suspected seizure. PMH: paroxysmal a-fib, HTN, dyslipidemia, DM2, CVA, COPD, essential tremor, seizure disorder     Clinical Impressions Pt was seen for OT evaluation this date. PTA, pt reports living at home with her husband and son and being indep with all tasks without use of AD. She reports 2 falls in the last 6 months. Reports her son is home 24/7, but her husband works during the day.   Pt presents with deficits in safety awareness, short term memory and executive functioning as well as low activity tolerance and mild weakness and imbalance limiting their ability to perform ADL management at baseline level. Pt currently requires supervision for bed mobility and CGA + RW for STS and functional mobility/ADL transfer noted to bump into obstacles and unable to negotiate environment without assist and cues. Pt is A and O x4 but is unable to recall 1/3 words given to her within 2 mins. Edu on need for assist with med management from family to ensure safety with pt in agreement. Standing grooming tasks and LB dressing to donn mesh underwear performed with CGA/SBA for safety with cues for unilateral support on sink. Will follow acutely to maximize safety/indep for safe return home with follow up OT recommended.       If plan is discharge home, recommend the following:   A little help with walking and/or transfers;A little help with bathing/dressing/bathroom;Help with stairs or ramp for entrance;Supervision due to cognitive status;Direct supervision/assist for financial management;Direct supervision/assist for medications management     Functional Status Assessment   Patient has had a  recent decline in their functional status and demonstrates the ability to make significant improvements in function in a reasonable and predictable amount of time.     Equipment Recommendations   None recommended by OT     Recommendations for Other Services         Precautions/Restrictions   Precautions Precautions: Fall Precaution/Restrictions Comments: seizures Restrictions Weight Bearing Restrictions Per Provider Order: No     Mobility Bed Mobility Overal bed mobility: Needs Assistance Bed Mobility: Supine to Sit, Sit to Supine     Supine to sit: Supervision, HOB elevated, Used rails Sit to supine: Supervision   General bed mobility comments: increased time/effort    Transfers Overall transfer level: Needs assistance Equipment used: Rolling walker (2 wheels) Transfers: Sit to/from Stand Sit to Stand: Contact guard assist           General transfer comment: cues for safety and hand placement, tends to leave RW behind when she gets close to surfaces to sit on      Balance Overall balance assessment: Needs assistance Sitting-balance support: Feet supported Sitting balance-Leahy Scale: Good     Standing balance support: During functional activity, Bilateral upper extremity supported, Reliant on assistive device for balance Standing balance-Leahy Scale: Fair Standing balance comment: RW + external support for safety                           ADL either performed or assessed with clinical judgement   ADL Overall ADL's : Needs assistance/impaired     Grooming: Wash/dry face;Standing;Supervision/safety;Contact guard assist Grooming Details (indicate cue type and reason):  no UE support however close SBA for safety             Lower Body Dressing: Contact guard assist;Supervision/safety;Sitting/lateral leans;Sit to/from stand Lower Body Dressing Details (indicate cue type and reason): CGA/SBA to donn mesh underwear seated/standing at  EOB             Functional mobility during ADLs: Contact guard assist;Rolling walker (2 wheels) General ADL Comments: cues for safety awareness, bumps walker into obstacles and is unable to problem solve to fix the issue without assist     Vision Patient Visual Report: No change from baseline Vision Assessment?: Wears glasses for reading     Perception         Praxis         Pertinent Vitals/Pain Pain Assessment Pain Assessment: 0-10 Pain Score: 7  Pain Location: lower back Pain Descriptors / Indicators: Discomfort Pain Intervention(s): Limited activity within patient's tolerance, Monitored during session, Repositioned     Extremity/Trunk Assessment Upper Extremity Assessment Upper Extremity Assessment: Overall WFL for tasks assessed   Lower Extremity Assessment Lower Extremity Assessment: Generalized weakness   Cervical / Trunk Assessment Cervical / Trunk Assessment: Normal   Communication Communication Communication: No apparent difficulties Factors Affecting Communication: Difficulty expressing self;Reduced clarity of speech   Cognition Arousal: Alert Behavior During Therapy: WFL for tasks assessed/performed Cognition: Cognition impaired     Awareness: Online awareness impaired     Executive functioning impairment (select all impairments): Problem solving                   Following commands: Impaired Following commands impaired: Follows multi-step commands with increased time     Cueing  General Comments   Cueing Techniques: Verbal cues  HR up to 133 following activity, sp02 WFL on RA; pt now on tele   Exercises Other Exercises Other Exercises: Edu on role of OT in acute setting, managing meds with assist from husband/son to prevent safety issues/overdose or missing doses d/t STM deficits. Other Exercises: Pt unable to recall 1/3 words after 2 minutes.   Shoulder Instructions      Home Living Family/patient expects to be discharged  to:: Private residence Living Arrangements: Spouse/significant other;Children Available Help at Discharge: Family;Available 24 hours/day Type of Home: Mobile home Home Access: Stairs to enter Entrance Stairs-Number of Steps: 2 Entrance Stairs-Rails: Left Home Layout: One level     Bathroom Shower/Tub: Producer, Television/film/video: Handicapped height     Home Equipment: Agricultural Consultant (2 wheels);Cane - single point;BSC/3in1;Wheelchair - power          Prior Functioning/Environment Prior Level of Function : Independent/Modified Independent;Patient poor historian/Family not available             Mobility Comments: ambulatory without AD, 2 falls in the past 6 months, driving ADLs Comments: independent with ADLs, IADLs and mobility through grocery store    OT Problem List:     OT Treatment/Interventions:        OT Goals(Current goals can be found in the care plan section)   Acute Rehab OT Goals Patient Stated Goal: improve function OT Goal Formulation: With patient Time For Goal Achievement: 01/14/25 Potential to Achieve Goals: Good ADL Goals Pt Will Perform Lower Body Dressing: with modified independence;sitting/lateral leans;sit to/from stand Pt Will Transfer to Toilet: with modified independence;ambulating Additional ADL Goal #1: Pt will perform higher level iADL tasks, e.g. med management or laundry with supervision to prevent safety issue d/t cognitive deficits.   OT  Frequency:  Min 2X/week    Co-evaluation              AM-PAC OT 6 Clicks Daily Activity     Outcome Measure Help from another person eating meals?: None Help from another person taking care of personal grooming?: A Little Help from another person toileting, which includes using toliet, bedpan, or urinal?: A Little Help from another person bathing (including washing, rinsing, drying)?: A Little Help from another person to put on and taking off regular upper body clothing?: None Help  from another person to put on and taking off regular lower body clothing?: A Little 6 Click Score: 20   End of Session Equipment Utilized During Treatment: Rolling walker (2 wheels) Nurse Communication: Mobility status  Activity Tolerance: Patient tolerated treatment well Patient left: in bed;with call bell/phone within reach;with bed alarm set  OT Visit Diagnosis: Other abnormalities of gait and mobility (R26.89);Muscle weakness (generalized) (M62.81)                Time: 8448-8381 OT Time Calculation (min): 27 min Charges:  OT General Charges $OT Visit: 1 Visit OT Evaluation $OT Eval Moderate Complexity: 1 Mod OT Treatments $Self Care/Home Management : 8-22 mins  Skylier Kretschmer Chrismon, OTR/L 12/31/24, 4:37 PM  Nicholes Hibler E Chrismon 12/31/2024, 4:32 PM

## 2024-12-31 NOTE — Consult Note (Signed)
 " Consultation  Referring Provider:  Hospitalist    Admit date: 12/27/2024 Consult date: 12/31/2024         Reason for Consultation: Melenic stool              HPI:   Nicole Pittman is a 74 y.o. lady with history of chronic pain on opioids, COPD, hypertension, and seizure disorder who was found down after presumed seizure and was altered and eventually required intubation for altered mental status. She has been extubated and transferred to the floor. Noted to have  melenic stool today. She notes 5 episodes of diarrhea. Hemoglobin stable and BUN is not elevated. She had something solid to eat one hour ago and endorses significant shortness of breath. She had an EGD/Colonoscopy a long time ago in [place] and notes it was unremarkable. No NSAID use. No blood thinners except prophylactic lovenox .  Past Medical History:  Diagnosis Date   Adhesive arachnoiditis    Arthritis    Back problem    spurs   COPD (chronic obstructive pulmonary disease) (HCC)    Diabetes (HCC)    High cholesterol    Hypertension    Neck problem    bad disc    Past Surgical History:  Procedure Laterality Date   BACK SURGERY  1978   L5   CHOLECYSTECTOMY  1992   KNEE SURGERY Bilateral 1993   OVARIAN CYST REMOVAL  1985   TONSILLECTOMY  1959   WRIST SURGERY Bilateral 2003    Family History  Problem Relation Age of Onset   Rheum arthritis Mother    Hypertension Mother    Parkinson's disease Mother    Parkinsonism Mother    Diabetes Father    Breast cancer Maternal Aunt 51   Bone cancer Maternal Aunt    Lupus Maternal Aunt     Social History[1]  Prior to Admission medications  Medication Sig Start Date End Date Taking? Authorizing Provider  amitriptyline  (ELAVIL ) 25 MG tablet Take 25 mg by mouth at bedtime.   Yes [provider]  aspirin  81 MG tablet Take 81 mg by mouth daily with supper.    Yes [provider]  atorvastatin  (LIPITOR) 40 MG tablet Take 40 mg by mouth at bedtime.    Yes  [provider]  fenofibrate  (TRICOR ) 48 MG tablet Take 48 mg by mouth at bedtime.    Yes [provider]  glipiZIDE -metformin  (METAGLIP ) 5-500 MG tablet Take 2 tablets by mouth 2 (two) times daily. 12/25/15  Yes [provider]  hydrOXYzine  (ATARAX /VISTARIL ) 25 MG tablet Take 25 mg by mouth at bedtime as needed for anxiety. 08/23/20  Yes [provider]  latanoprost  (XALATAN ) 0.005 % ophthalmic solution Place 1 drop into both eyes at bedtime. 10/10/24  Yes [provider]  levETIRAcetam  (KEPPRA ) 500 MG tablet Take 500 mg by mouth 2 (two) times daily.   Yes [provider]  lisinopril  (ZESTRIL ) 5 MG tablet Take 5 mg by mouth daily.   Yes [provider]  metoCLOPramide  (REGLAN ) 10 MG tablet Take 10 mg by mouth 4 (four) times daily. 12/16/24  Yes [provider]  metoprolol  tartrate (LOPRESSOR ) 100 MG tablet Take 100 mg by mouth 2 (two) times daily. 11/08/19  Yes [provider]  oxyCODONE  (OXYCONTIN ) 10 mg 12 hr tablet Take 1 tablet (10 mg total) by mouth every 12 (twelve) hours. 11/05/24  Yes Laurita Pillion, MD  amLODipine  (NORVASC ) 10 MG tablet TAKE 1 TABLET BY MOUTH EVERY DAY Patient  not taking: Reported on 11/04/2024 08/13/21   Mona Vinie BROCKS, MD  Cholecalciferol  (VITAMIN D -3 PO) Take 1 capsule by mouth daily. Patient not taking: Reported on 12/27/2024    [provider]  EASY COMFORT LANCETS MISC 4 (four) times daily. for testing 12/14/15   [provider]  FREESTYLE LITE test strip 4 (four) times daily. for testing 12/14/15   [provider]  nitroGLYCERIN  (NITROSTAT ) 0.4 MG SL tablet Place 1 tablet (0.4 mg total) under the tongue every 5 (five) minutes as needed for chest pain. MAX 3 doses in 15 minutes Patient not taking: Reported on 12/27/2024 10/17/20 01/15/21  Meng, Hao, PA    Current Facility-Administered Medications  Medication Dose Route Frequency Provider Last Rate Last Admin    acetaminophen  (TYLENOL ) tablet 650 mg  650 mg Oral Q6H PRN Laurita Pillion, MD       Or   acetaminophen  (TYLENOL ) suppository 650 mg  650 mg Rectal Q6H PRN Laurita Pillion, MD       amLODipine  (NORVASC ) tablet 10 mg  10 mg Oral Daily Zhang, Dekui, MD   10 mg at 12/31/24 1005   [START ON 01/01/2025] aspirin  chewable tablet 81 mg  81 mg Oral Daily Zhang, Dekui, MD       atorvastatin  (LIPITOR) tablet 40 mg  40 mg Oral QHS Laurita Pillion, MD       Chlorhexidine  Gluconate Cloth 2 % PADS 6 each  6 each Topical Daily Rust-Chester, Jenita CROME, NP   6 each at 12/30/24 0930   cyanocobalamin  (VITAMIN B12) injection 1,000 mcg  1,000 mcg Intramuscular Daily Zhang, Dekui, MD   1,000 mcg at 12/31/24 1235   feeding supplement (ENSURE PLUS HIGH PROTEIN) liquid 237 mL  237 mL Oral TID BM Laurita Pillion, MD       NOREEN ON 01/01/2025] fenofibrate  tablet 54 mg  54 mg Oral Daily Zhang, Dekui, MD       hydrALAZINE  (APRESOLINE ) injection 10 mg  10 mg Intravenous Q4H PRN Assaker, Darrin, MD   10 mg at 12/31/24 0531   [START ON 01/01/2025] influenza vac split trivalent PF (FLUZONE ) injection 0.5 mL  0.5 mL Intramuscular Tomorrow-1000 Laurita Pillion, MD       insulin  aspart (novoLOG ) injection 0-15 Units  0-15 Units Subcutaneous Q4H Rust-Chester, Jenita CROME, NP   3 Units at 12/31/24 1234   labetalol  (NORMODYNE ) injection 10-20 mg  10-20 mg Intravenous Q2H PRN Rust-Chester, Britton L, NP   20 mg at 12/31/24 9285   latanoprost  (XALATAN ) 0.005 % ophthalmic solution 1 drop  1 drop Both Eyes QHS Patel, Sona, MD   1 drop at 12/30/24 2209   levETIRAcetam  (KEPPRA ) tablet 500 mg  500 mg Oral BID Laurita Pillion, MD       lisinopril  (ZESTRIL ) tablet 10 mg  10 mg Oral Daily Zhang, Dekui, MD   10 mg at 12/31/24 1005   multivitamin with minerals tablet 1 tablet  1 tablet Oral Daily Laurita Pillion, MD       ondansetron  (ZOFRAN ) tablet 4 mg  4 mg Oral Q6H PRN Laurita Pillion, MD       Or   ondansetron  (ZOFRAN ) injection 4 mg  4 mg Intravenous Q6H PRN Zhang,  Dekui, MD       pantoprazole  (PROTONIX ) injection 40 mg  40 mg Intravenous Q12H Laurita Pillion, MD   40 mg at 12/31/24 1234   polyethylene glycol (MIRALAX  / GLYCOLAX ) packet 17 g  17 g Oral Daily PRN Laurita Pillion, MD  promethazine (PHENERGAN) 12.5 mg in sodium chloride  0.9 % 50 mL IVPB  12.5 mg Intravenous Q6H PRN Zhang, Dekui, MD       senna (SENOKOT) tablet 8.6 mg  1 tablet Oral BID PRN Laurita Pillion, MD        Allergies as of 12/27/2024 - Review Complete 12/27/2024  Allergen Reaction Noted   Fentanyl  Itching and Rash 08/04/2018   Codeine Hives 01/16/2016   Morphine and codeine Hives 01/16/2016   Symbicort [budesonide-formoterol fumarate] Other (See Comments) 07/08/2018   Zanaflex [tizanidine] Palpitations and Other (See Comments) 07/08/2018     Review of Systems:    All systems reviewed and negative except where noted in HPI.  Review of Systems  Constitutional:  Negative for chills and fever.  Respiratory:  Positive for shortness of breath.   Cardiovascular:  Negative for chest pain.  Gastrointestinal:  Positive for diarrhea. Negative for abdominal pain.  Musculoskeletal:  Positive for back pain.  Skin:  Negative for rash.  Neurological:  Negative for focal weakness.  Psychiatric/Behavioral:  Negative for substance abuse.   All other systems reviewed and are negative.      Physical Exam:  Vital signs in last 24 hours: Temp:  [98.2 F (36.8 C)-99.3 F (37.4 C)] 99.3 F (37.4 C) (02/06 1210) Pulse Rate:  [70-128] 96 (02/06 1210) Resp:  [10-31] 18 (02/06 1210) BP: (124-200)/(35-127) 178/54 (02/06 1210) SpO2:  [92 %-98 %] 96 % (02/06 1210) Weight:  [55.1 kg] 55.1 kg (02/06 0438) Last BM Date : 12/31/24 General:   Pleasant in NAD Head:  Normocephalic and atraumatic. Eyes:   No icterus.   Conjunctiva pink. Ears:  Normal auditory acuity. Mouth: Mucosa pink moist, no lesions. Neck:  Supple; no masses felt Lungs:  No respiratory distress Abdomen:   Flat, soft,  nondistended, nontender Msk:  Symmetrical without gross deformities. Neurologic:  No focal deficits Skin:  Warm, dry, pink without significant lesions or rashes. Psych:  Alert and cooperative. Normal affect.  LAB RESULTS: Recent Labs    12/29/24 0325 12/30/24 0408 12/31/24 0351 12/31/24 1048  WBC 9.9 11.2* 10.7*  --   HGB 10.8* 9.8* 10.5* 10.8*  HCT 32.6* 29.6* 32.6*  --   PLT 244 259 284  --    BMET Recent Labs    12/29/24 0326 12/29/24 1737 12/30/24 0408 12/31/24 0351  NA 132*  --  136 138  K 3.2* 2.9* 3.2* 3.5  CL 91*  --  95* 99  CO2 22  --  24 23  GLUCOSE 217*  --  158* 120*  BUN 14  --  15 16  CREATININE 0.80  --  0.70 0.66  CALCIUM  8.4*  --  8.2* 8.6*   LFT Recent Labs    12/31/24 0351  ALBUMIN 3.5   PT/INR No results for input(s): LABPROT, INR in the last 72 hours.  STUDIES: No results found.     Impression / Plan:   74 y.o. lady with history of chronic pain on opioids, COPD, hypertension, and seizure disorder who was found down after presumed seizure and was altered and eventually required intubation for altered mental status, extubated and now with dark stool. Stable hemglobin and no elevation in BUN.  - given she has recently ate and hemodynamically stable, does not need emergent evaluation, will place on clears and re-evaluate tomorrow morning for best timing of potential endoscopy - continue IV PPI BID - NPO at midnight - two large bore IV's - discontinued lovenox  - monitor for any  recurrent GI bleeding  Will continue to follow, please call with any questions or concerns.  Frederic Schick MD, MPH [facility]      [1]  Social History Tobacco Use   Smoking status: Former    Current packs/day: 0.50    Types: Cigarettes   Smokeless tobacco: Never  Vaping Use   Vaping status: Every Day  Substance Use Topics   Alcohol use: No    Alcohol/week: 0.0 standard drinks of alcohol   Drug use: No   "

## 2024-12-31 NOTE — Hospital Course (Signed)
 Nicole Pittman is a 74 year old female with history of paroxysmal atrial fibrillation, essential hypertension, dyslipidemia, type 2 diabetes, history of CVA, COPD, who presents to the hospital with confusion.  She developed acute respiratory failure secondary to acute encephalopathy in the setting of suspected seizure.  Patient was intubated, maintained on mechanical ventilation in ICU.  She was seen by neurology, started on Keppra  for suspected seizure. Her conditions overall has improved, however, patient started have multiple loose and black stools in the morning of 2/6.  Started on IV Protonix , GI consult obtained.

## 2024-12-31 NOTE — Progress Notes (Signed)
 "  NAME:  Nicole Pittman, MRN:  [id number], DOB:  10/20/1951, LOS: 3 ADMISSION DATE:  12/27/2024 History of Present Illness:  73-year-old female presenting to [facility] ED from home via EMS on 12/27/24 for evaluation of altered mental status.   History obtained per chart review as patient is unresponsive and intubated on mechanical ventilatory support without family bedside. Per EMS report patient was last seen well by her husband on 12/26/2024 around 10 PM.  He then found her down on the ground at home on 12/27/2024 with confusion and diffuse tremors.  Her baseline was described as alert and oriented x 4.  There was no report of traumatic injury and the duration of downtime unknown.   Upon arrival to ED patient alert stating she feels generally unwell but denied any specific complaints.  She denied fevers/ cough/ chest pain/ shortness of breath/ nausea/ vomiting/ diarrhea or dysuria. Patient was documented at this time as being a very poor historian, alert and oriented only to place and person.  She was unable to state why she was here and just kept saying  I do not know to the majority of questions.    Unclear per previous documentation if patient has been taking medication as prescribed.   ED course: Upon arrival patient A&O x 2, with hypertensive urgency, tachypnea and mild tachycardia.  Labs significant for hypochloremia, mild hyperglycemia, mildly elevated anion gap, lactic acidosis with mild leukocytosis.  UDS negative.  Imaging unremarkable Initial Vitals: 98.4, 34, 112, 209/91 and 97% on room air   Significant labs:  I, Jenita Ruth Rust-Chester, AGACNP-BC, personally viewed and interpreted this ECG. EKG Interpretation: Date: 12/28/2024, EKG Time: 04:12, Rate: 156, Rhythm: SVT, QRS Axis: Borderline LAD Intervals: UTA (due to rate), ST/T Wave abnormalities: ST depression suspect due to right, Narrative Interpretation: SVT   Most recent (Labs/ Imaging personally reviewed) chemistry: Na+: 137, K+:  4.1, Cl: 94, BUN/Cr.:  18/0.94, Serum CO2/ AG: 24/19, glucose: 197 Hematology: WBC: 10.7 > 13.4, Hgb: 12.1,  Lactic/ PCT: 2.9 > 3.2 > 2.7/pending, COVID-19 & Influenza A/B: Negative VBG: 7.45/44/36/30.6   CXR 12/27/2024: Mild left basilar linear atelectasis CT head wo contrast 12/27/2024: Generalized cerebral atrophy with chronic white matter small vessel ischemic changes.  No acute intracranial abnormality.  Chronic left maxillary sinus disease   Hospital course: Patient admitted by TRH service for workup of altered mental status.  Admitting service unable to reach spouse.  Unfortunately due to altered mental status patient was unable to take p.o. medications including her nighttime dose of Keppra .  Around 4 AM staff arrived to bedside due to patient's vital sign changes on cardiac monitoring.  Upon entering the room patient unresponsive with sonorous like respirations, right gaze preference, tachycardia and hypertension. She received 1 g IV Keppra  load, requiring urgent intubation and mechanical ventilatory support.   PCCM consulted for assistance in management and monitoring due to acute hypoxic respiratory failure secondary to acute encephalopathy in the setting of suspected seizure activity requiring intubation and mechanical ventilatory support.  Pertinent  Medical History  COPD Chronic back pain on chronic opiates Seizure disorder Type 2 diabetes mellitus Diabetic gastroparesis Hypertension Hyperlipidemia CKD stage IIIa Essential tremor Atrial fibrillation Hypothyroidism Adhesive Arachnoiditis  Diverticulosis  Significant Hospital Events: Including procedures, antibiotic start and stop dates in addition to other pertinent events   12/27/2024: Admit to inpatient for observation and workup of altered mental status.  Overnight, acute hypoxic respiratory failure secondary to acute encephalopathy in the setting of suspected seizure activity  requiring intubation and mechanical ventilatory  support.  PCCM consulted  Interim History / Subjective:  -- Febrile overnight.  -- Awake and following commands. Extubated 02/04 -- EEG suggestive of cortical dysfunction arising from the left hemisphere likely secondary to underlying structural abn.  -- MRI w/ contrast without any abnormal contrast enhancement.   Objective    Blood pressure (!) 197/67, pulse 83, temperature 98.7 F (37.1 C), temperature source Oral, resp. rate (!) 21, height 5' (1.524 m), weight 55.1 kg, SpO2 96%.        Intake/Output Summary (Last 24 hours) at 12/31/2024 0806 Last data filed at 12/31/2024 0400 Gross per 24 hour  Intake 10 ml  Output 1325 ml  Net -1315 ml   Filed Weights   12/29/24 0457 12/30/24 0352 12/31/24 0438  Weight: 58.2 kg 56.6 kg 55.1 kg    Examination: General: Following commands NAD HENT: Supple neck reactive pupils Lungs: Clear bilateral air entry. Minimal Secretions.  Cardiovascular: Normal S1, Normal S2, RRR  Abdomen: Soft, non tender, non distended, +BS  Extremities: Warm and well perfused no edema.    Labs and imaging were reviewed.   Assessment and Plan  #Acute toxic metabolic encephalopathy # Diarreah with possible GI bleed - GI evaluation - PPI started  #Type II DM  #COPD continue on albuterol   #Chronic back pain on oxycodone    # Seizure disorder - on Keppra   TRH has taken over care and PCCM will sign off today.    Marten Iles, M.D.  Pulmonary & Critical Care Medicine  [facility]   12/31/2024 8:06 AM   "

## 2024-12-31 NOTE — TOC Initial Note (Signed)
 Transition of Care [facility]) - Initial/Assessment Note    Patient Details  Name: Nicole Pittman MRN: [id number] Date of Birth: [birth year]  [facility] CM/SW Contact:    Nicole JINNY Ruts, [name] Phone Number: 12/31/2024, 11:20 AM  Clinical Narrative:                 Chart reviewed. SW was able to speak with the patient at bedside today. I introduced myself, my role, and reason for visit. The patient confirmed that she has a PCP. The patient reports that she lives in the home with her husband and son. The patient reports that she was able to complete task independently. The patient reports that she drives her self to medical appointments. The patient reports that her husband and son will assist her during D/C.   The [facility]. The patient reports that she has had HH in January. The patient reports that she has never been admitted in a SNF in the past. The patient reports that she has an art gallery manager, walker, and BSC in the home.   SW inquired about [facility] or SNF placement if recommended. The patient was accepting of [facility] and declined SNF placement if reccommended.   There patient consented to sending out referral if she sees PT/OT. SW also provided the patient with a list of agencies.     Barriers to Discharge: Continued Medical Work up   Patient Goals and CMS Choice   CMS Medicare.gov Compare Post Acute Care list provided to:: Patient Choice offered to / list presented to : Patient      Expected Discharge Plan and Services                                              Prior Living Arrangements/Services   Lives with:: Spouse, Adult Children Patient language and need for interpreter reviewed:: Yes        Need for Family Participation in Patient Care: Yes (Comment)     Criminal Activity/Legal Involvement Pertinent to Current Situation/Hospitalization: No - Comment as needed  Activities of Daily Living   ADL Screening (condition  at time of admission) Independently performs ADLs?: Yes (appropriate for developmental age) Is the patient deaf or have difficulty hearing?: No Does the patient have difficulty seeing, even when wearing glasses/contacts?: No Does the patient have difficulty concentrating, remembering, or making decisions?: No  Permission Sought/Granted                  Emotional Assessment Appearance:: Appears stated age Attitude/Demeanor/Rapport: Gracious Affect (typically observed): Calm Orientation: : Oriented to Self, Oriented to  Time, Oriented to Place, Oriented to Situation Alcohol / Substance Use: Not Applicable Psych Involvement: No (comment)  Admission diagnosis:  Acute encephalopathy [G93.40] Altered mental status, unspecified altered mental status type [R41.82] AMS (altered mental status) [R41.82] Patient Active Problem List   Diagnosis Date Noted   Hypokalemia 12/31/2024   Pressure injury of skin 12/31/2024   Hyponatremia 12/31/2024   Hypomagnesemia 12/31/2024   Hypophosphatemia 12/31/2024   Lactic acidosis 12/31/2024   Acute hypoxic respiratory failure (HCC) 12/28/2024   AMS (altered mental status) 12/27/2024   CVA (cerebral vascular accident) (HCC) 11/04/2024   Acute encephalopathy 11/04/2024   Encephalopathy acute 11/04/2024   Hypertensive urgency 01/23/2020   Diabetes mellitus without complication (HCC) 01/23/2020   AKI (acute kidney  injury) 01/23/2020   Chest pain 01/23/2020   RLS (restless legs syndrome) 01/23/2020   Elevated troponin 01/23/2020   Demand ischemia (HCC) 01/23/2020   Troponin level elevated    Colitis 08/04/2019   Paroxysmal atrial fibrillation (HCC)    Chest pain, precordial 07/08/2018   Tremor of face and hands 01/21/2016   HTN (hypertension), malignant 01/21/2016   HLD (hyperlipidemia) 01/21/2016   Diabetes (HCC) 01/21/2016   COPD (chronic obstructive pulmonary disease) (HCC) 01/21/2016   PCP:  Catalina Bare, MD [facility] [address], [place] - [address] [place] [address] Phone: [phone] Fax: [phone]     Social Drivers of Health (SDOH) Social History: SDOH Screenings   Food Insecurity: Patient Unable To Answer (12/28/2024)  Housing: Patient Unable To Answer (12/28/2024)  Transportation Needs: Patient Unable To Answer (12/28/2024)  Utilities: Patient Unable To Answer (12/28/2024)  Social Connections: Patient Unable To Answer (12/28/2024)  Tobacco Use: Medium Risk (12/31/2024)   SDOH Interventions:     Readmission Risk Interventions     No data to display

## 2024-12-31 NOTE — Progress Notes (Signed)
 " Progress Note   Patient: Nicole Pittman FMW:[id number] DOB: 08/07/1951 DOA: 12/27/2024     3 DOS: the patient was seen and examined on 12/31/2024   Brief hospital course: Nicole Pittman is a 74 year old female with history of paroxysmal atrial fibrillation, essential hypertension, dyslipidemia, type 2 diabetes, history of CVA, COPD, who presents to the hospital with confusion.  She developed acute respiratory failure secondary to acute encephalopathy in the setting of suspected seizure.  Patient was intubated, maintained on mechanical ventilation in ICU.  She was seen by neurology, started on Keppra  for suspected seizure. Her conditions overall has improved, however, patient started have multiple loose and black stools in the morning of 2/6.  Started on IV Protonix , GI consult obtained.   Principal Problem:   AMS (altered mental status) Active Problems:   HTN (hypertension), malignant   Acute encephalopathy   Acute hypoxic respiratory failure (HCC)   Hypokalemia   Pressure injury of skin   Hyponatremia   Hypomagnesemia   Hypophosphatemia   Lactic acidosis   Assessment and Plan: Acute encephalopathy probably from postictal from seizure disorder. Presumed seizure. Acute respiratory failure with hypoxemia secondary to encephalopathy. Patient developed acute respiratory failure at time of admission due to altered mental status.  Presumed to be seizure.  Has been seen by neurology, EEG showed diffuse slowing, was placed on Keppra .  Will change to oral for now.  As patient does not have additional episodes since admission. Respiratory status slightly improved, no short of breath or hypoxemia.  Patient has been transferred to medical floor.  Upper GI bleed. Chronic anemia with B12 deficiency. Patient had 4 large black stools this morning, currently, hemoglobin still stable.  Will monitor hemoglobin for now, start IV Protonix . Reviewed the labs, patient has a B12 of 218, has B12 deficiency.   Will start B12 injection. Also check iron level and ferritin level. GI consult obtained.  Hyponatremia. Hypokalemia. Hypomagnesemia. Hypophosphatemia. Lactic acidosis secondary to seizure. Recheck levels tomorrow.  Essential hypertension. Blood pressure running high, restart home medicines.  COPD. Appears stable, no exacerbation.  Type 2 diabetes uncontrolled with hyperglycemia. Glucose was running high at the time of admission, currently glucose is better.  Continue sliding scale insulin .  Paroxysmal atrial fibrillation. Currently in sinus, confirmed with husband, patient is not on anticoagulation.  Pressure ulcer POA. Wound 12/28/24 0550 Pressure Injury Sacrum Medial Stage 1 -  Intact skin with non-blanchable redness of a localized area usually over a bony prominence. (Active)         Subjective:  Patient doing better today, but has 4 large black loose stools today.  Had some nausea, did not vomit anything up.  No abdominal pain.   Physical Exam: Vitals:   12/31/24 0915 12/31/24 0930 12/31/24 0945 12/31/24 1000  BP: (!) 194/66 (!) 200/71  (!) 189/78  Pulse:  (!) 103  93  Resp: (!) 21 (!) 22 (!) 24 15  Temp:      TempSrc:      SpO2:  96%  97%  Weight:      Height:       General exam: Appears calm and comfortable  Respiratory system: Clear to auscultation. Respiratory effort normal. Cardiovascular system: S1 & S2 heard, RRR. No JVD, murmurs, rubs, gallops or clicks. No pedal edema. Gastrointestinal system: Abdomen is nondistended, soft and nontender. No organomegaly or masses felt. Normal bowel sounds heard. Central nervous system: Alert and oriented x3. No focal neurological deficits. Extremities: Symmetric 5 x 5 power. Skin: No  rashes, lesions or ulcers Psychiatry:  Mood & affect appropriate.    Data Reviewed:  Reviewed lab results.  Family Communication: Husband updated over the phone.  Disposition: Status is: Inpatient Remains inpatient  appropriate because: Severity of disease, IV treatment.     Time spent: 55 minutes  Author: Murvin Mana, MD 12/31/2024 11:08 AM  For on call review [email or web address].    "

## 2024-12-31 NOTE — Evaluation (Addendum)
 Physical Therapy Evaluation Patient Details Name: Nicole Pittman MRN: [id number] DOB: November 20, 1951 Today's Date: 12/31/2024  History of Present Illness  Pt is a 74 y/o F admitted on 12/27/24 after presenting with confusion. Pt developed acute respiratory failure 2/2 acute encephalopathy in the setting of suspected seizure. PMH: paroxysmal a-fib, HTN, dyslipidemia, DM2, CVA, COPD, essential tremor, seizure disorder  Clinical Impression  Pt seen for PT evaluation with pt agreeable to tx. Pt is AxOx4 but question pt's safety awareness & increased processing time. Pt reports prior to admission pt was independent without AD, driving, living with spouse & son. On this date, pt is able to ambulate around nurses station with RW & CGA. PT provides cuing to use RW at all times vs pushing it to the side when a few steps away from recliner. Recommend ongoing PT services to progress strengthening, balance, & gait with LRAD to reduce fall risk.        If plan is discharge home, recommend the following: A little help with bathing/dressing/bathroom;A little help with walking and/or transfers;Assistance with cooking/housework;Assist for transportation;Help with stairs or ramp for entrance   Can travel by private vehicle        Equipment Recommendations Rolling walker (2 wheels);BSC/3in1  Recommendations for Other Services    OT consult   Functional Status Assessment Patient has had a recent decline in their functional status and demonstrates the ability to make significant improvements in function in a reasonable and predictable amount of time.     Precautions / Restrictions Precautions Precautions: Fall Precaution/Restrictions Comments: seizures Restrictions Weight Bearing Restrictions Per Provider Order: No      Mobility  Bed Mobility Overal bed mobility: Needs Assistance Bed Mobility: Supine to Sit     Supine to sit: Contact guard, HOB elevated, Used rails (extra time, exit L side of bed)           Transfers Overall transfer level: Needs assistance Equipment used: Rolling walker (2 wheels) Transfers: Sit to/from Stand, Bed to chair/wheelchair/BSC Sit to Stand: Contact guard assist   Step pivot transfers: Min assist (bed>recliner on L, pt letting go of walker with PT providing ongoing cuing to use it vs pushing it to the side)       General transfer comment: education/cuing re: hand placement to push to standing    Ambulation/Gait Ambulation/Gait assistance: Contact guard assist Gait Distance (Feet): 170 Feet Assistive device: Rolling walker (2 wheels) Gait Pattern/deviations: Decreased step length - right, Decreased step length - left, Decreased stride length Gait velocity: decreased     General Gait Details: ambulates 1 lap around nurses station without LOB  Stairs            Wheelchair Mobility     Tilt Bed    Modified Rankin (Stroke Patients Only)       Balance Overall balance assessment: Needs assistance Sitting-balance support: Feet supported Sitting balance-Leahy Scale: Good     Standing balance support: During functional activity, Bilateral upper extremity supported, Reliant on assistive device for balance Standing balance-Leahy Scale: Fair                               Pertinent Vitals/Pain Pain Assessment Pain Assessment: 0-10 Pain Score: 7  Pain Location: back Pain Descriptors / Indicators: Discomfort Pain Intervention(s): Monitored during session, Repositioned    Home Living Family/patient expects to be discharged to:: Private residence Living Arrangements: Spouse/significant other;Children Available Help at Discharge: Family;Available 24  hours/day Type of Home: Mobile home Home Access: Stairs to enter Entrance Stairs-Rails: Left Entrance Stairs-Number of Steps: 2   Home Layout: One level Home Equipment: Agricultural Consultant (2 wheels);Cane - single point;BSC/3in1;Wheelchair - power      Prior Function                Mobility Comments: ambulatory without AD, 2 falls in the past 6 months, driving ADLs Comments: independent     Extremity/Trunk Assessment   Upper Extremity Assessment Upper Extremity Assessment: Overall WFL for tasks assessed    Lower Extremity Assessment Lower Extremity Assessment: Generalized weakness    Cervical / Trunk Assessment Cervical / Trunk Assessment: Normal  Communication   Communication Communication: No apparent difficulties    Cognition Arousal: Alert Behavior During Therapy: WFL for tasks assessed/performed   PT - Cognitive impairments: No family/caregiver present to determine baseline, Safety/Judgement                       PT - Cognition Comments: AxOx4, question increased processing, decreased safety awareness (pt reports she took herself to the bathroom ~30 min prior to PT arrival) Following commands: Impaired Following commands impaired: Follows multi-step commands with increased time     Cueing Cueing Techniques: Verbal cues     General Comments General comments (skin integrity, edema, etc.): Pt received on supplemental O2, weaned to room air with SpO2 >/= 90% throughout, HR 135 bpm sitting immediately after gait - nurse made aware of pt left on room air in recliner.    Exercises     Assessment/Plan    PT Assessment Patient needs continued PT services  PT Problem List Decreased strength;Cardiopulmonary status limiting activity;Decreased activity tolerance;Decreased balance;Decreased mobility;Decreased cognition;Decreased knowledge of use of DME;Decreased safety awareness;Decreased knowledge of precautions       PT Treatment Interventions DME instruction;Therapeutic exercise;Gait training;Balance training;Stair training;Neuromuscular re-education;Patient/family education;Therapeutic activities;Modalities;Cognitive remediation;Functional mobility training    PT Goals (Current goals can be found in the Care Plan section)  Acute  Rehab PT Goals Patient Stated Goal: none stated PT Goal Formulation: With patient Time For Goal Achievement: 01/14/25 Potential to Achieve Goals: Good    Frequency Min 2X/week     Co-evaluation               AM-PAC PT 6 Clicks Mobility  Outcome Measure Help needed turning from your back to your side while in a flat bed without using bedrails?: None Help needed moving from lying on your back to sitting on the side of a flat bed without using bedrails?: A Little Help needed moving to and from a bed to a chair (including a wheelchair)?: A Little Help needed standing up from a chair using your arms (e.g., wheelchair or bedside chair)?: A Little Help needed to walk in hospital room?: A Little Help needed climbing 3-5 steps with a railing? : A Little 6 Click Score: 19    End of Session   Activity Tolerance: Patient tolerated treatment well Patient left: in chair;with chair alarm set;with call bell/phone within reach Nurse Communication: Mobility status (HR, O2) PT Visit Diagnosis: Muscle weakness (generalized) (M62.81);Unsteadiness on feet (R26.81);Other abnormalities of gait and mobility (R26.89)    Time: 8648-8590 PT Time Calculation (min) (ACUTE ONLY): 18 min   Charges:   PT Evaluation $PT Eval Low Complexity: 1 Low   PT General Charges $$ ACUTE PT VISIT: 1 Visit         Richerd Pinal, PT, DPT 12/31/24, 2:23 PM  Richerd CHRISTELLA Pinal 12/31/2024, 2:21 PM
